# Patient Record
Sex: Female | Born: 1950 | Race: White | Hispanic: No | Smoking: Never smoker
Health system: Southern US, Community
[De-identification: ages and names within clinical notes are randomized; demographics above are authoritative.]

## PROBLEM LIST (undated history)

## (undated) DIAGNOSIS — R519 Headache, unspecified: Secondary | ICD-10-CM

## (undated) DIAGNOSIS — I499 Cardiac arrhythmia, unspecified: Secondary | ICD-10-CM

## (undated) DIAGNOSIS — F329 Major depressive disorder, single episode, unspecified: Secondary | ICD-10-CM

## (undated) DIAGNOSIS — J45909 Unspecified asthma, uncomplicated: Secondary | ICD-10-CM

## (undated) DIAGNOSIS — E119 Type 2 diabetes mellitus without complications: Secondary | ICD-10-CM

## (undated) DIAGNOSIS — M1990 Unspecified osteoarthritis, unspecified site: Secondary | ICD-10-CM

## (undated) DIAGNOSIS — F32A Depression, unspecified: Secondary | ICD-10-CM

## (undated) DIAGNOSIS — K3184 Gastroparesis: Secondary | ICD-10-CM

## (undated) DIAGNOSIS — G4730 Sleep apnea, unspecified: Secondary | ICD-10-CM

## (undated) DIAGNOSIS — M797 Fibromyalgia: Secondary | ICD-10-CM

## (undated) DIAGNOSIS — R51 Headache: Secondary | ICD-10-CM

## (undated) DIAGNOSIS — K219 Gastro-esophageal reflux disease without esophagitis: Secondary | ICD-10-CM

## (undated) DIAGNOSIS — I209 Angina pectoris, unspecified: Secondary | ICD-10-CM

## (undated) HISTORY — DX: Fibromyalgia: M79.7

## (undated) HISTORY — DX: Gastroparesis: K31.84

## (undated) HISTORY — PX: CHOLECYSTECTOMY: SHX55

## (undated) HISTORY — PX: BLADDER REPAIR: SHX76

## (undated) HISTORY — PX: ABDOMINAL HYSTERECTOMY: SHX81

## (undated) HISTORY — PX: TUBAL LIGATION: SHX77

## (undated) HISTORY — PX: OTHER SURGICAL HISTORY: SHX169

---

## 2011-01-08 DIAGNOSIS — G43909 Migraine, unspecified, not intractable, without status migrainosus: Secondary | ICD-10-CM | POA: Insufficient documentation

## 2011-05-22 DIAGNOSIS — M5030 Other cervical disc degeneration, unspecified cervical region: Secondary | ICD-10-CM | POA: Insufficient documentation

## 2013-02-08 HISTORY — PX: CATARACT EXTRACTION: SUR2

## 2015-10-02 DIAGNOSIS — Z8639 Personal history of other endocrine, nutritional and metabolic disease: Secondary | ICD-10-CM | POA: Insufficient documentation

## 2015-11-04 ENCOUNTER — Emergency Department: Payer: Medicare Other

## 2015-11-04 ENCOUNTER — Emergency Department
Admission: EM | Admit: 2015-11-04 | Discharge: 2015-11-04 | Disposition: A | Discharge status: Home / Self Care | Payer: Medicare Other | Attending: Emergency Medicine | Admitting: Emergency Medicine

## 2015-11-04 ENCOUNTER — Encounter: Payer: Self-pay | Admitting: Emergency Medicine

## 2015-11-04 DIAGNOSIS — Y939 Activity, unspecified: Secondary | ICD-10-CM | POA: Diagnosis not present

## 2015-11-04 DIAGNOSIS — W06XXXA Fall from bed, initial encounter: Secondary | ICD-10-CM | POA: Insufficient documentation

## 2015-11-04 DIAGNOSIS — S0990XA Unspecified injury of head, initial encounter: Secondary | ICD-10-CM

## 2015-11-04 DIAGNOSIS — E119 Type 2 diabetes mellitus without complications: Secondary | ICD-10-CM | POA: Diagnosis not present

## 2015-11-04 DIAGNOSIS — Y999 Unspecified external cause status: Secondary | ICD-10-CM | POA: Insufficient documentation

## 2015-11-04 DIAGNOSIS — S0181XA Laceration without foreign body of other part of head, initial encounter: Secondary | ICD-10-CM | POA: Diagnosis present

## 2015-11-04 DIAGNOSIS — F329 Major depressive disorder, single episode, unspecified: Secondary | ICD-10-CM | POA: Diagnosis not present

## 2015-11-04 DIAGNOSIS — Y929 Unspecified place or not applicable: Secondary | ICD-10-CM | POA: Insufficient documentation

## 2015-11-04 HISTORY — DX: Major depressive disorder, single episode, unspecified: F32.9

## 2015-11-04 HISTORY — DX: Type 2 diabetes mellitus without complications: E11.9

## 2015-11-04 HISTORY — DX: Depression, unspecified: F32.A

## 2015-11-04 NOTE — Discharge Instructions (Signed)
Facial Laceration ° A facial laceration is a cut on the face. These injuries can be painful and cause bleeding. Lacerations usually heal quickly, but they need special care to reduce scarring. °DIAGNOSIS  °Your health care provider will take a medical history, ask for details about how the injury occurred, and examine the wound to determine how deep the cut is. °TREATMENT  °Some facial lacerations may not require closure. Others may not be able to be closed because of an increased risk of infection. The risk of infection and the chance for successful closure will depend on various factors, including the amount of time since the injury occurred. °The wound may be cleaned to help prevent infection. If closure is appropriate, pain medicines may be given if needed. Your health care provider will use stitches (sutures), wound glue (adhesive), or skin adhesive strips to repair the laceration. These tools bring the skin edges together to allow for faster healing and a better cosmetic outcome. If needed, you may also be given a tetanus shot. °HOME CARE INSTRUCTIONS °· Only take over-the-counter or prescription medicines as directed by your health care provider. °· Follow your health care provider's instructions for wound care. These instructions will vary depending on the technique used for closing the wound. °For Sutures: °· Keep the wound clean and dry.   °· If you were given a bandage (dressing), you should change it at least once a day. Also change the dressing if it becomes wet or dirty, or as directed by your health care provider.   °· Wash the wound with soap and water 2 times a day. Rinse the wound off with water to remove all soap. Pat the wound dry with a clean towel.   °· After cleaning, apply a thin layer of the antibiotic ointment recommended by your health care provider. This will help prevent infection and keep the dressing from sticking.   °· You may shower as usual after the first 24 hours. Do not soak the  wound in water until the sutures are removed.   °· Get your sutures removed as directed by your health care provider. With facial lacerations, sutures should usually be taken out after 4-5 days to avoid stitch marks.   °· Wait a few days after your sutures are removed before applying any makeup. °For Skin Adhesive Strips: °· Keep the wound clean and dry.   °· Do not get the skin adhesive strips wet. You may bathe carefully, using caution to keep the wound dry.   °· If the wound gets wet, pat it dry with a clean towel.   °· Skin adhesive strips will fall off on their own. You may trim the strips as the wound heals. Do not remove skin adhesive strips that are still stuck to the wound. They will fall off in time.   °For Wound Adhesive: °· You may briefly wet your wound in the shower or bath. Do not soak or scrub the wound. Do not swim. Avoid periods of heavy sweating until the skin adhesive has fallen off on its own. After showering or bathing, gently pat the wound dry with a clean towel.   °· Do not apply liquid medicine, cream medicine, ointment medicine, or makeup to your wound while the skin adhesive is in place. This may loosen the film before your wound is healed.   °· If a dressing is placed over the wound, be careful not to apply tape directly over the skin adhesive. This may cause the adhesive to be pulled off before the wound is healed.   °· Avoid   prolonged exposure to sunlight or tanning lamps while the skin adhesive is in place. °· The skin adhesive will usually remain in place for 5-10 days, then naturally fall off the skin. Do not pick at the adhesive film.   °After Healing: °Once the wound has healed, cover the wound with sunscreen during the day for 1 full year. This can help minimize scarring. Exposure to ultraviolet light in the first year will darken the scar. It can take 1-2 years for the scar to lose its redness and to heal completely.  °SEEK MEDICAL CARE IF: °· You have a fever. °SEEK IMMEDIATE  MEDICAL CARE IF: °· You have redness, pain, or swelling around the wound.   °· You see a yellowish-white fluid (pus) coming from the wound.   °  °This information is not intended to replace advice given to you by your health care provider. Make sure you discuss any questions you have with your health care provider. °  °Document Released: 05/21/2004 Document Revised: 05/04/2014 Document Reviewed: 11/24/2012 °Elsevier Interactive Patient Education ©2016 [employer] ° °

## 2015-11-04 NOTE — ED Provider Notes (Signed)
[facility] Emergency Department Provider Note  ____________________________________________  Time seen: Approximately 12:48 PM  I have reviewed the triage vital signs and the nursing notes.   HISTORY  Chief Complaint Fall and Laceration   HPI Judith [name] is a 65 y.o. female who presents to the emergency department for evaluation of a laceration to her forehead. She states that her bed is high off the floor. The doorbell rang and she missed the step that she uses to get in and out of the bed and fell, hitting her head on the corner of the nightstand. Incident happened at about 8:30 this morning. Tetanus shot is up to date. She denies loss of consciousness.   Past Medical History  Diagnosis Date  . Diabetes mellitus without complication (HCC)   . Depression     There are no active problems to display for this patient.   History reviewed. No pertinent past surgical history.  No current outpatient prescriptions on file.  Allergies Pravastatin  History reviewed. No pertinent family history.  Social History Social History  Substance Use Topics  . Smoking status: Never Smoker   . Smokeless tobacco: None  . Alcohol Use: Yes    Review of Systems  Constitutional: Negative for fever/chills Respiratory: Negative for shortness of breath. Musculoskeletal: Negative for pain. Skin: Positive for laceration. Neurological: Negative for headaches, focal weakness or numbness. ____________________________________________   PHYSICAL EXAM:  VITAL SIGNS: ED Triage Vitals  Enc Vitals Group     BP 11/04/15 1218 121/73 mmHg     Pulse Rate 11/04/15 1218 84     Resp 11/04/15 1218 18     Temp 11/04/15 1218 98.5 F (36.9 C)     Temp Source 11/04/15 1218 Oral     SpO2 11/04/15 1218 95 %     Weight 11/04/15 1218 209 lb (94.802 kg)     Height 11/04/15 1218 5\' 1"  (1.549 m)     Head Cir --      Peak Flow --      Pain Score 11/04/15 1226 8     Pain Loc --       Pain Edu? --      Excl. in GC? --      Constitutional: Alert and oriented. Well appearing and in no acute distress. Head: Area under small laceration appears slightly depressed. Eyes: Conjunctivae are normal. EOMI. Nose: No congestion/rhinnorhea. Mouth/Throat: Mucous membranes are moist.   Neck: No stridor. Lymphatic: No cervical lymphadenopathy. Cardiovascular: Good peripheral circulation. Respiratory: Normal respiratory effort.  No retractions. Musculoskeletal: FROM throughout. Nexus criteria negative.  Neurologic:  Normal speech and language. No gross focal neurologic deficits are appreciated. Skin:  1cm laceration to the left forehead. Contusion surrounding laceration noted.   ____________________________________________   LABS (all labs ordered are listed, but only abnormal results are displayed)  Labs Reviewed - No data to display ____________________________________________  EKG   ____________________________________________  RADIOLOGY  CT head without contrast is negative for acute abnormality. ____________________________________________   PROCEDURES  Procedure(s) performed:   LACERATION REPAIR Performed by: Kem Boroughsari Antwann Preziosi  Consent: Verbal consent obtained.  Consent given by: patient  Laceration Location: left forehead  Laceration Length: 1cm  Anesthesia: n/a  Irrigation method: syringe  Amount of cleaning: standard  Skin closure: skin adhesive  Number of sutures: n/a  Technique: topical after re-approximating wound.  Patient tolerance: Patient tolerated the procedure well with no immediate complications.  ____________________________________________   INITIAL IMPRESSION / ASSESSMENT AND PLAN / ED COURSE  Pertinent  labs & imaging results that were available during my care of the patient were reviewed by me and considered in my medical decision making (see chart for details).  Head injury instructions discussed with the patient.  She is to return to the ER for any symptoms of concern if unable to schedule an appointment with her PCP.   There are no discharge medications for this patient.   ____________________________________________   INITIAL IMPRESSION / ASSESSMENT AND PLAN / ED COURSE  Pertinent labs & imaging results that were available during my care of the patient were reviewed by me and considered in my medical decision making (see chart for details).  She will be advised to take tylenol or ibuprofen if needed for pain.  She was advised to follow up with her PCP for concerns.  She was also advised to return to the emergency department for symptoms that change or worsen if unable to schedule an appointment.  ____________________________________________   FINAL CLINICAL IMPRESSION(S) / ED DIAGNOSES  Final diagnoses:  Facial laceration, initial encounter  Minor head injury, initial encounter    There are no discharge medications for this patient.    Chinita Pester, FNP 11/04/15 1447  Richardean Canal, MD 11/04/15 1538

## 2015-11-04 NOTE — ED Notes (Signed)
Pt presents to ED c/o laceration to right upper forehead due to a fall. Pt hit bed nightstand. Bleeding controlled.

## 2016-01-07 ENCOUNTER — Other Ambulatory Visit: Payer: Self-pay | Admitting: Internal Medicine

## 2016-01-07 DIAGNOSIS — R7401 Elevation of levels of liver transaminase levels: Secondary | ICD-10-CM

## 2016-01-07 DIAGNOSIS — R748 Abnormal levels of other serum enzymes: Secondary | ICD-10-CM

## 2016-01-07 DIAGNOSIS — R109 Unspecified abdominal pain: Secondary | ICD-10-CM

## 2016-01-07 DIAGNOSIS — R740 Nonspecific elevation of levels of transaminase and lactic acid dehydrogenase [LDH]: Secondary | ICD-10-CM

## 2016-01-13 ENCOUNTER — Ambulatory Visit
Admission: RE | Admit: 2016-01-13 | Discharge: 2016-01-13 | Disposition: A | Discharge status: Home / Self Care | Payer: Medicare Other | Source: Ambulatory Visit | Attending: Internal Medicine | Admitting: Internal Medicine

## 2016-01-13 ENCOUNTER — Encounter: Payer: Self-pay | Admitting: Radiology

## 2016-01-13 DIAGNOSIS — R195 Other fecal abnormalities: Secondary | ICD-10-CM | POA: Insufficient documentation

## 2016-01-13 DIAGNOSIS — R160 Hepatomegaly, not elsewhere classified: Secondary | ICD-10-CM | POA: Insufficient documentation

## 2016-01-13 DIAGNOSIS — K760 Fatty (change of) liver, not elsewhere classified: Secondary | ICD-10-CM | POA: Insufficient documentation

## 2016-01-13 DIAGNOSIS — R109 Unspecified abdominal pain: Secondary | ICD-10-CM

## 2016-01-13 DIAGNOSIS — R740 Nonspecific elevation of levels of transaminase and lactic acid dehydrogenase [LDH]: Secondary | ICD-10-CM

## 2016-01-13 DIAGNOSIS — M5135 Other intervertebral disc degeneration, thoracolumbar region: Secondary | ICD-10-CM | POA: Insufficient documentation

## 2016-01-13 DIAGNOSIS — R7401 Elevation of levels of liver transaminase levels: Secondary | ICD-10-CM

## 2016-01-13 DIAGNOSIS — R1010 Upper abdominal pain, unspecified: Secondary | ICD-10-CM | POA: Diagnosis present

## 2016-01-13 DIAGNOSIS — R748 Abnormal levels of other serum enzymes: Secondary | ICD-10-CM | POA: Diagnosis present

## 2016-01-13 MED ORDER — IOPAMIDOL (ISOVUE-370) INJECTION 76%
100.0000 mL | Freq: Once | INTRAVENOUS | Status: AC | PRN
  Administered 2016-01-13: 100 mL via INTRAVENOUS

## 2016-03-31 ENCOUNTER — Encounter: Payer: Self-pay | Admitting: *Deleted

## 2016-04-01 ENCOUNTER — Ambulatory Visit: Payer: Medicare Other | Admitting: Anesthesiology

## 2016-04-01 ENCOUNTER — Encounter
Admission: RE | Disposition: A | Discharge status: Home / Self Care | Payer: Self-pay | Source: Ambulatory Visit | Attending: Unknown Physician Specialty

## 2016-04-01 ENCOUNTER — Ambulatory Visit
Admission: RE | Admit: 2016-04-01 | Discharge: 2016-04-01 | Disposition: A | Discharge status: Home / Self Care | Payer: Medicare Other | Source: Ambulatory Visit | Attending: Unknown Physician Specialty | Admitting: Unknown Physician Specialty

## 2016-04-01 ENCOUNTER — Encounter: Payer: Self-pay | Admitting: Anesthesiology

## 2016-04-01 DIAGNOSIS — J45909 Unspecified asthma, uncomplicated: Secondary | ICD-10-CM | POA: Insufficient documentation

## 2016-04-01 DIAGNOSIS — G4730 Sleep apnea, unspecified: Secondary | ICD-10-CM | POA: Insufficient documentation

## 2016-04-01 DIAGNOSIS — Z79899 Other long term (current) drug therapy: Secondary | ICD-10-CM | POA: Insufficient documentation

## 2016-04-01 DIAGNOSIS — K2950 Unspecified chronic gastritis without bleeding: Secondary | ICD-10-CM | POA: Diagnosis not present

## 2016-04-01 DIAGNOSIS — Z7951 Long term (current) use of inhaled steroids: Secondary | ICD-10-CM | POA: Insufficient documentation

## 2016-04-01 DIAGNOSIS — Z7984 Long term (current) use of oral hypoglycemic drugs: Secondary | ICD-10-CM | POA: Insufficient documentation

## 2016-04-01 DIAGNOSIS — R1310 Dysphagia, unspecified: Secondary | ICD-10-CM | POA: Diagnosis present

## 2016-04-01 DIAGNOSIS — E119 Type 2 diabetes mellitus without complications: Secondary | ICD-10-CM | POA: Diagnosis not present

## 2016-04-01 HISTORY — PX: ESOPHAGOGASTRODUODENOSCOPY (EGD) WITH PROPOFOL: SHX5813

## 2016-04-01 HISTORY — DX: Sleep apnea, unspecified: G47.30

## 2016-04-01 HISTORY — DX: Gastro-esophageal reflux disease without esophagitis: K21.9

## 2016-04-01 HISTORY — DX: Headache, unspecified: R51.9

## 2016-04-01 HISTORY — DX: Angina pectoris, unspecified: I20.9

## 2016-04-01 HISTORY — DX: Unspecified osteoarthritis, unspecified site: M19.90

## 2016-04-01 HISTORY — DX: Unspecified asthma, uncomplicated: J45.909

## 2016-04-01 HISTORY — DX: Headache: R51

## 2016-04-01 LAB — GLUCOSE, CAPILLARY: Glucose-Capillary: 152 mg/dL — ABNORMAL HIGH (ref 65–99)

## 2016-04-01 SURGERY — ESOPHAGOGASTRODUODENOSCOPY (EGD) WITH PROPOFOL
Anesthesia: General

## 2016-04-01 MED ORDER — LACTATED RINGERS IV SOLN
INTRAVENOUS | Status: DC | PRN
  Administered 2016-04-01: 08:00:00 via INTRAVENOUS

## 2016-04-01 MED ORDER — PROPOFOL 500 MG/50ML IV EMUL
INTRAVENOUS | Status: DC | PRN
  Administered 2016-04-01: 125 ug/kg/min via INTRAVENOUS

## 2016-04-01 MED ORDER — SODIUM CHLORIDE 0.9 % IV SOLN
INTRAVENOUS | Status: DC
  Administered 2016-04-01: 1000 mL via INTRAVENOUS

## 2016-04-01 MED ORDER — SODIUM CHLORIDE 0.9 % IV SOLN: INTRAVENOUS | Status: DC

## 2016-04-01 MED ORDER — PROPOFOL 10 MG/ML IV BOLUS
INTRAVENOUS | Status: DC | PRN
  Administered 2016-04-01: 50 mg via INTRAVENOUS

## 2016-04-01 NOTE — Anesthesia Preprocedure Evaluation (Signed)
Anesthesia Evaluation  Patient identified by MRN, date of birth, ID band Patient awake    Reviewed: Allergy & Precautions, H&P , NPO status , Patient's Chart, lab work & pertinent test results, reviewed documented beta blocker date and time   History of Anesthesia Complications Negative for: history of anesthetic complications  Airway Mallampati: II  TM Distance: >3 FB Neck ROM: full    Dental no notable dental hx. (+) Teeth Intact   Pulmonary neg shortness of breath, asthma , sleep apnea and Continuous Positive Airway Pressure Ventilation , neg COPD, Recent URI , Residual Cough,    Pulmonary exam normal breath sounds clear to auscultation       Cardiovascular Exercise Tolerance: Good negative cardio ROS Normal cardiovascular exam Rhythm:regular Rate:Normal     Neuro/Psych PSYCHIATRIC DISORDERS (Depression) negative neurological ROS     GI/Hepatic Neg liver ROS, GERD  ,  Endo/Other  diabetes, Well Controlled, Type 2, Oral Hypoglycemic Agents  Renal/GU negative Renal ROS  negative genitourinary   Musculoskeletal   Abdominal   Peds  Hematology negative hematology ROS (+)   Anesthesia Other Findings Past Medical History: No date: Anginal pain (HCC) No date: Arthritis No date: Asthma No date: Depression No date: Diabetes mellitus without complication (HCC) No date: GERD (gastroesophageal reflux disease) No date: Headache No date: Sleep apnea   Reproductive/Obstetrics negative OB ROS                             Anesthesia Physical Anesthesia Plan  ASA: II  Anesthesia Plan: General   Post-op Pain Management:    Induction:   Airway Management Planned:   Additional Equipment:   Intra-op Plan:   Post-operative Plan:   Informed Consent: I have reviewed the patients History and Physical, chart, labs and discussed the procedure including the risks, benefits and alternatives for  the proposed anesthesia with the patient or authorized representative who has indicated his/her understanding and acceptance.   Dental Advisory Given  Plan Discussed with: Anesthesiologist, CRNA and Surgeon  Anesthesia Plan Comments:         Anesthesia Quick Evaluation

## 2016-04-01 NOTE — Transfer of Care (Signed)
Immediate Anesthesia Transfer of Care Note  Patient: Judith [name]  Procedure(s) Performed: Procedure(s): ESOPHAGOGASTRODUODENOSCOPY (EGD) WITH PROPOFOL (N/A)  Patient Location: PACU and Endoscopy Unit  Anesthesia Type:General  Level of Consciousness: awake, alert  and oriented  Airway & Oxygen Therapy: Patient Spontanous Breathing and Patient connected to nasal cannula oxygen  Post-op Assessment: Report given to RN and Post -op Vital signs reviewed and stable  Post vital signs: Reviewed and stable  Last Vitals:  Vitals:   04/01/16 0643  BP: (!) 145/86  Pulse: 100  Resp: 16  Temp: 36.5 C    Last Pain:  Vitals:   04/01/16 0643  TempSrc: Tympanic         Complications: No apparent anesthesia complications

## 2016-04-01 NOTE — H&P (Signed)
Primary Care Physician:  Leotis ShamesSingh,Jasmine, MD Primary Gastroenterologist:  Dr. Mechele CollinElliott  Pre-Procedure History & Physical: HPI:  Judith Saferatricia Delcastillo is a 65 y.o. female is here for an endoscopy.   Past Medical History:  Diagnosis Date  . Anginal pain (HCC)   . Arthritis   . Asthma   . Depression   . Diabetes mellitus without complication (HCC)   . GERD (gastroesophageal reflux disease)   . Headache   . Sleep apnea     Past Surgical History:  Procedure Laterality Date  . ABDOMINAL HYSTERECTOMY    . BLADDER REPAIR    . CATARACT EXTRACTION Left 02/08/2013  . CHOLECYSTECTOMY      Prior to Admission medications   Medication Sig Start Date End Date Taking? Authorizing Provider  albuterol (PROVENTIL HFA;VENTOLIN HFA) 108 (90 Base) MCG/ACT inhaler Inhale 2 puffs into the lungs every 6 (six) hours as needed for wheezing or shortness of breath.   Yes Historical Provider, MD  cetirizine (ZYRTEC) 10 MG tablet Take 10 mg by mouth daily.   Yes Historical Provider, MD  glipiZIDE (GLUCOTROL XL) 10 MG 24 hr tablet Take 10 mg by mouth daily with breakfast.   Yes Historical Provider, MD  glucose blood test strip 1 each by Other route. Use as instructed   Yes Historical Provider, MD  linagliptin (TRADJENTA) 5 MG TABS tablet Take 5 mg by mouth daily.   Yes Historical Provider, MD  metFORMIN (GLUCOPHAGE) 500 MG tablet Take 1,000 mg by mouth 2 (two) times daily with a meal.   Yes Historical Provider, MD  mometasone (NASONEX) 50 MCG/ACT nasal spray Place 2 sprays into the nose daily.   Yes Historical Provider, MD  Multiple Vitamin (MULTIVITAMIN) tablet Take 1 tablet by mouth daily.   Yes Historical Provider, MD    Allergies as of 03/16/2016 - Review Complete 01/13/2016  Allergen Reaction Noted  . Pravastatin Other (See Comments) 11/04/2015    History reviewed. No pertinent family history.  Social History   Social History  . Marital status: Widowed    Spouse name: N/A  . Number of children: N/A   . Years of education: N/A   Occupational History  . Not on file.   Social History Main Topics  . Smoking status: Never Smoker  . Smokeless tobacco: Never Used  . Alcohol use Yes     Comment: averages lessthan 1 drink per week  . Drug use: No  . Sexual activity: No   Other Topics Concern  . Not on file   Social History Narrative  . No narrative on file    Review of Systems: See HPI, otherwise negative ROS  Physical Exam: BP (!) 145/86   Pulse 100   Temp 97.7 F (36.5 C) (Tympanic)   Resp 16   Ht 5\' 2"  (1.575 m)   Wt 90.7 kg (200 lb)   SpO2 97%   BMI 36.58 kg/m  General:   Alert,  pleasant and cooperative in NAD Head:  Normocephalic and atraumatic. Neck:  Supple; no masses or thyromegaly. Lungs:  Clear throughout to auscultation.    Heart:  Regular rate and rhythm. Abdomen:  Soft, nontender and nondistended. Normal bowel sounds, without guarding, and without rebound, somewhat obese.Marland Kitchen.   Neurologic:  Alert and  oriented x4;  grossly normal neurologically.  Impression/Plan: Judith [name] is here for an endoscopy to be performed for epigastric pain, dysphagia  Risks, benefits, limitations, and alternatives regarding  endoscopy have been reviewed with the patient.  Questions have been  answered.  All parties agreeable.   Lynnae PrudeELLIOTT, Ollis Daudelin, MD  04/01/2016, 7:24 AM

## 2016-04-01 NOTE — Anesthesia Postprocedure Evaluation (Signed)
Anesthesia Post Note  Patient: Rivka Saferatricia Kiely  Procedure(s) Performed: Procedure(s) (LRB): ESOPHAGOGASTRODUODENOSCOPY (EGD) WITH PROPOFOL (N/A)  Patient location during evaluation: Endoscopy Anesthesia Type: General Level of consciousness: awake and alert Pain management: pain level controlled Vital Signs Assessment: post-procedure vital signs reviewed and stable Respiratory status: spontaneous breathing, nonlabored ventilation, respiratory function stable and patient connected to nasal cannula oxygen Cardiovascular status: blood pressure returned to baseline and stable Postop Assessment: no signs of nausea or vomiting Anesthetic complications: no    Last Vitals:  Vitals:   04/01/16 0810 04/01/16 0820  BP: 106/67 (!) 106/57  Pulse: 85 83  Resp: 16 14  Temp:      Last Pain:  Vitals:   04/01/16 0750  TempSrc: Tympanic                 Lenard SimmerAndrew Angelik Walls

## 2016-04-01 NOTE — Op Note (Signed)
[facility] Gastroenterology Patient Name: Judith [name] Procedure Date: 04/01/2016 7:30 AM MRN: [id number] Account #: [id number] Date of Birth: 04/07/1951 Admit Type: Outpatient Age: [age] Room[facility] ENDO ROOM 4 Gender: Female Note Status: Finalized Procedure:            Upper GI endoscopy Indications:          Dysphagia Providers:            Scot Junobert T. Elliott, MD Referring MD:         Leotis ShamesJasmine Singh (Referring MD) Medicines:            Propofol per Anesthesia Complications:        No immediate complications. Procedure:            Pre-Anesthesia Assessment:                       - After reviewing the risks and benefits, the patient                        was deemed in satisfactory condition to undergo the                        procedure.                       After obtaining informed consent, the endoscope was                        passed under direct vision. Throughout the procedure,                        the patient's blood pressure, pulse, and oxygen                        saturations were monitored continuously. The Endoscope                        was introduced through the mouth, and advanced to the                        second part of duodenum. The upper GI endoscopy was                        accomplished without difficulty. The patient tolerated                        the procedure well. Findings:      The examined esophagus was normal.At the end of the procedure A       guidewire was placed and the scope was withdrawn. Dilation was performed       with a Savary dilator with mild resistance at 15 mm, 16 mm and 17 mm.      A single small sessile polyp with no bleeding and no stigmata of recent       bleeding was found in the gastric fundus. Biopsies were taken with a       cold forceps for histology. Stomach otherwise normal.      The examined duodenum was normal. Impression:           - Normal esophagus. Dilated.                       -  A single  gastric polyp. Biopsied.                       - Normal examined duodenum. Recommendation:       - soft food for 3 days, eat slowly, chew well, take                        small bites Scot Junobert T Elliott, MD 04/01/2016 7:46:47 AM This report has been signed electronically. Number of Addenda: 0 Note Initiated On: 04/01/2016 7:30 AM      [facility]

## 2016-04-02 ENCOUNTER — Encounter: Payer: Self-pay | Admitting: Unknown Physician Specialty

## 2016-04-02 LAB — SURGICAL PATHOLOGY

## 2016-08-23 DIAGNOSIS — R2689 Other abnormalities of gait and mobility: Secondary | ICD-10-CM | POA: Insufficient documentation

## 2016-10-13 DIAGNOSIS — R413 Other amnesia: Secondary | ICD-10-CM | POA: Insufficient documentation

## 2016-10-13 DIAGNOSIS — R251 Tremor, unspecified: Secondary | ICD-10-CM | POA: Insufficient documentation

## 2016-10-13 DIAGNOSIS — R519 Headache, unspecified: Secondary | ICD-10-CM | POA: Insufficient documentation

## 2017-01-25 ENCOUNTER — Other Ambulatory Visit: Payer: Self-pay | Admitting: Internal Medicine

## 2017-01-25 DIAGNOSIS — Z1231 Encounter for screening mammogram for malignant neoplasm of breast: Secondary | ICD-10-CM

## 2017-02-11 ENCOUNTER — Ambulatory Visit
Admission: RE | Admit: 2017-02-11 | Discharge: 2017-02-11 | Disposition: A | Discharge status: Home / Self Care | Payer: Medicare Other | Source: Ambulatory Visit | Attending: Internal Medicine | Admitting: Internal Medicine

## 2017-02-11 DIAGNOSIS — Z1231 Encounter for screening mammogram for malignant neoplasm of breast: Secondary | ICD-10-CM | POA: Diagnosis not present

## 2017-03-17 ENCOUNTER — Ambulatory Visit: Payer: Medicare Other | Admitting: Physical Therapy

## 2017-03-23 ENCOUNTER — Ambulatory Visit: Payer: Medicare Other | Attending: Neurology | Admitting: Physical Therapy

## 2017-03-23 ENCOUNTER — Encounter: Payer: Self-pay | Admitting: Physical Therapy

## 2017-03-23 DIAGNOSIS — M6281 Muscle weakness (generalized): Secondary | ICD-10-CM | POA: Insufficient documentation

## 2017-03-23 DIAGNOSIS — G5603 Carpal tunnel syndrome, bilateral upper limbs: Secondary | ICD-10-CM | POA: Insufficient documentation

## 2017-03-23 DIAGNOSIS — G478 Other sleep disorders: Secondary | ICD-10-CM | POA: Diagnosis present

## 2017-03-23 NOTE — Therapy (Signed)
[facility] MAIN [facility] SERVICES [address] [place], [place], [address] Phone: [phone]   Fax:  [phone]  Physical Therapy Evaluation  Patient Details  Name: Judith [name] MRN: [id number] Date of Birth: 01-28-1951 Referring Provider: Dr. Malvin Johns   Encounter Date: 03/23/2017  PT End of Session - 03/23/17 1621    Visit Number  1    Number of Visits  17    Date for PT Re-Evaluation  05/18/17    PT Start Time  0400    PT Stop Time  0500    PT Time Calculation (min)  60 min    Equipment Utilized During Treatment  Gait belt    Activity Tolerance  Patient tolerated treatment well;Patient limited by fatigue    Behavior During Therapy  [facility] for tasks assessed/performed       Past Medical History:  Diagnosis Date  . Anginal pain (HCC)   . Arthritis   . Asthma   . Depression   . Diabetes mellitus without complication (HCC)   . GERD (gastroesophageal reflux disease)   . Headache   . Sleep apnea     Past Surgical History:  Procedure Laterality Date  . ABDOMINAL HYSTERECTOMY    . BLADDER REPAIR    . CATARACT EXTRACTION Left 02/08/2013  . CHOLECYSTECTOMY    . ESOPHAGOGASTRODUODENOSCOPY (EGD) WITH PROPOFOL N/A 04/01/2016   Procedure: ESOPHAGOGASTRODUODENOSCOPY (EGD) WITH PROPOFOL;  Surgeon: Scot Jun, MD;  Location: [facility] ENDOSCOPY;  Service: Endoscopy;  Laterality: N/A;    There were no vitals filed for this visit.   Subjective Assessment - 03/23/17 1611    Subjective  Patient is having gait imbalance and sometimes uses the walls if she has lost her balance.     Pertinent History  atient is 66 year old that has had 2 falls without  knowing the cause , she passed out. The second time was november 7th and she was going up the step and had her hands full. She does not use a AD. she hit her left knee.  She had a n occipital block 1 week ago and now her headaches are goine. She has fibromyalgia and has mild pain in her shoulders, and  back . She has DDD in lumbar spine.     How long can you walk comfortably?  She is able to walk the dog and walk for and is unlimited with her time.     Patient Stated Goals  She wants to have better balance.     Multiple Pain Sites  No         [facility] PT Assessment - 03/23/17 0001      Assessment   Medical Diagnosis  gait imbalance    Referring Provider  Dr. Malvin Johns    Onset Date/Surgical Date  02/26/17    Hand Dominance  Right    Prior Therapy  no      Precautions   Precautions  Fall      Restrictions   Weight Bearing Restrictions  No    Other Position/Activity Restrictions  no      Balance Screen   Has the patient fallen in the past 6 months  Yes    How many times?  2    Has the patient had a decrease in activity level because of a fear of falling?   No    Is the patient reluctant to leave their home because of a fear of falling?   No  Home Environment   Living Environment  Private residence    Living Arrangements  Other relatives    Available Help at Discharge  Family    Type of Home  House    Home Access  Stairs to enter    Entrance Stairs-Number of Steps  1    Entrance Stairs-Rails  None    Home Layout  One level    Home Equipment  None      Prior Function   Level of Independence  Independent with basic ADLs;Independent with transfers;Independent with gait    Vocation  Retired         POSTURE: WNL   PROM/AROM: WNL  STRENGTH:  Graded on a 0-5 scale Muscle Group Left Right                          Hip Flex 5/5 5/5  Hip Abd 5/5 5/5  Hip Add -3/5 -3/5  Hip Ext 5/5 5/5  Hip IR/ER 5/5 5/5  Knee Flex 5/5 5/5  Knee Ext 5/5 5/5  Ankle DF 4/5 4/5  Ankle PF 3+/5 3+/5   SENSATION: WNL BUE    FUNCTIONAL MOBILITY: independent with transfers and bed mobility   BALANCE: Standing Dynamic Balance  Normal Stand independently unsupported, able to weight shift and cross midline maximally   Good Stand independently unsupported, able to weight shift  and cross midline moderately x  Good-/Fair+ Stand independently unsupported, able to weight shift across midline minimally   Fair Stand independently unsupported, weight shift, and reach ipsilaterally, loss of balance when crossing midline   Poor+ Able to stand with Min A and reach ipsilaterally, unable to weight shift   Poor Able to stand with Mod A and minimally reach ipsilaterally, unable to cross midline.    Static Standing Balance  Normal Able to maintain standing balance against maximal resistance   Good Able to maintain standing balance against moderate resistance x  Good-/Fair+ Able to maintain standing balance against minimal resistance   Fair Able to stand unsupported without UE support and without LOB for 1-2 min   Fair- Requires Min A and UE support to maintain standing without loss of balance   Poor+ Requires mod A and UE support to maintain standing without loss of balance   Poor Requires max A and UE support to maintain standing balance without loss       GAIT: Ambulates without AD and has difficulty with head turns and fast stops and pivot turns  OUTCOME MEASURES: TEST Outcome Interpretation  5 times sit<>stand 15.72sec >7 yo, >15 sec indicates increased risk for falls  10 meter walk test     1.31            m/[name] <1.0 m/[name] indicates increased risk for falls; limited community ambulator  Timed up and Go      8.44            sec <14 sec indicates increased risk for falls      Berg Balance Assessment 52/56 <36/56 (100% risk for falls), 37-45 (80% risk for falls); 46-51 (>50% risk for falls); 52-55 (lower risk <25% of falls)         Treatment:  heel raises x 10 x 2 RTB ankle eversion and ankle DF x 10 BLE     Objective measurements completed on examination: See above findings.              PT Education - 03/23/17 1620  Education provided  Yes    Education Details  plan of care    Person([name]) Educated  Patient    Methods  Explanation    Comprehension   Verbalized understanding;Returned demonstration       PT Short Term Goals - 03/23/17 1715      PT SHORT TERM GOAL #1   Title  Patient will be independent in home exercise program to improve strength/mobility for better functional independence with ADLs.    Time  4    Period  Weeks    Status  New    Target Date  04/20/17      PT SHORT TERM GOAL #2   Title  Patient (> 66 years old) will complete five times sit to stand test in < 15 seconds indicating an increased LE strength and improved balance.    Time  4    Period  Weeks    Status  New    Target Date  04/20/17        PT Long Term Goals - 03/23/17 1717      PT LONG TERM GOAL #1   Title  Patient will increase Berg Balance score by > 6 points to demonstrate decreased fall risk during functional activities.    Time  8    Period  Weeks    Status  New    Target Date  05/18/17      PT LONG TERM GOAL #2   Title  Patient will be independent with ascend/descend 12 steps using single UE in step over step pattern without LOB.    Time  8    Period  Weeks    Status  New    Target Date  05/18/17      PT LONG TERM GOAL #3   Title  Patient will be require no assist with ascend/descend 3 steps using Least restrictive assistive device.    Time  8    Period  Weeks    Status  New    Target Date  05/18/17             Plan - 03/23/17 1721    Clinical Impression Statement  Patient has had 2 falls and presents to PT for eval for gait instabiity. She has decreased ankle strength and outcome measures indicate a  balance defcit with Berg balance 52/56. She will benefit from skilled PT to improve balance and decrease her falls risk.     Clinical Presentation  Stable    Clinical Decision Making  Low    Rehab Potential  Good    PT Frequency  2x / week    PT Duration  8 weeks    PT Treatment/Interventions  Therapeutic exercise;Therapeutic activities;Neuromuscular re-education;Balance training;Stair training;Functional mobility training     PT Next Visit Plan  balance training and ankle strengthening    PT Home Exercise Plan  heel raises, ankle eversion and resisted DF with RTB    Consulted and Agree with Plan of Care  Patient       Patient will benefit from skilled therapeutic intervention in order to improve the following deficits and impairments:  Decreased balance, Difficulty walking, Decreased strength  Visit Diagnosis: Muscle weakness (generalized)  Difficulty waking  G-Codes - 03/23/17 1720    Functional Assessment Tool Used (Outpatient Only)  5 x sit to stand, berg    Functional Limitation  Mobility: Walking and moving around    Mobility: Walking and Moving Around Current Status ([id number])  At least 20  percent but less than 40 percent impaired, limited or restricted    Mobility: Walking and Moving Around Goal Status [phone]([id number])  At least 1 percent but less than 20 percent impaired, limited or restricted        Problem List There are no active problems to display for this patient.   [address]Judith [name], [place] DPT 03/23/2017, 5:34 PM  [place] [facility] [address] [name], [address] Phone: [phone]   Fax:  [phone]  Name: Judith [name] MRN: [id number] Date of Birth: 02/14/1951

## 2017-03-25 ENCOUNTER — Ambulatory Visit: Payer: Medicare Other | Admitting: Physical Therapy

## 2017-03-30 ENCOUNTER — Encounter: Payer: Self-pay | Admitting: Physical Therapy

## 2017-03-30 ENCOUNTER — Ambulatory Visit: Payer: Medicare Other | Attending: Neurology | Admitting: Physical Therapy

## 2017-03-30 DIAGNOSIS — M6281 Muscle weakness (generalized): Secondary | ICD-10-CM | POA: Diagnosis not present

## 2017-03-30 DIAGNOSIS — G478 Other sleep disorders: Secondary | ICD-10-CM | POA: Diagnosis present

## 2017-03-30 NOTE — Therapy (Signed)
[facility] [address] [place], [place], [address] Phone: [phone]   Fax:  [phone]  Physical Therapy Treatment  Patient Details  Name: Judith [name] MRN: [id number] Date of Birth: 07/30/1950 Referring Provider: Dr. Malvin JohnsPotter   Encounter Date: 03/30/2017  PT End of Session - 03/30/17 1606    Visit Number  2    Number of Visits  17    Date for PT Re-Evaluation  05/18/17    PT Start Time  1515    PT Stop Time  1600    PT Time Calculation (min)  45 min    Equipment Utilized During Treatment  Gait belt    Activity Tolerance  Patient tolerated treatment well;Patient limited by fatigue    Behavior During Therapy  [facility] for tasks assessed/performed       Past Medical History:  Diagnosis Date  . Anginal pain (HCC)   . Arthritis   . Asthma   . Depression   . Diabetes mellitus without complication (HCC)   . GERD (gastroesophageal reflux disease)   . Headache   . Sleep apnea     Past Surgical History:  Procedure Laterality Date  . ABDOMINAL HYSTERECTOMY    . BLADDER REPAIR    . CATARACT EXTRACTION Left 02/08/2013  . CHOLECYSTECTOMY    . ESOPHAGOGASTRODUODENOSCOPY (EGD) WITH PROPOFOL N/A 04/01/2016   Procedure: ESOPHAGOGASTRODUODENOSCOPY (EGD) WITH PROPOFOL;  Surgeon: Scot Junobert T Elliott, MD;  [facility];  Service: Endoscopy;  Laterality: N/A;    There were no vitals filed for this visit.  Subjective Assessment - 03/30/17 1604    Subjective  Pt reports no falls since her last visit.  She states she is having trouble remembering the correct way to do all of her 3 new home exercises.    Pertinent History  atient is 66 year old that has had 2 falls without  knowing the cause , she passed out. The second time was november 7th and she was going up the step and had her hands full. She does not use a AD. she hit her left knee.  She had a n occipital block 1 week ago and now her headaches are goine. She has  fibromyalgia and has mild pain in her shoulders, and back . She has DDD in lumbar spine.     How long can you walk comfortably?  She is able to walk the dog and walk for and is unlimited with her time.     Patient Stated Goals  She wants to have better balance.     Currently in Pain?  No/denies    Multiple Pain Sites  No        Treatment:  Seated ankle DF and eversion with RTB 2x10 each B; standing heel raises with hands on rails 2x10; sit to stands from gray plinth with RTB around thighs and hips abducted 2x10.  Discussed correct technique for HEP with ankle strengthening ex's-pt given hand outs.  SLS on AirEx pad with EO/EC; tandem stance on 2x2 board; tandem gait on line in // bars without UE assist; mini lunges onto AirEx pad alternating LE's without UE assist; balancing on half foam roller; further balancing on half foam roller with ball switch back and forth from hands                      PT Education - 03/30/17 1604    Education provided  Yes    Education Details  Reviewed HEP and corrected technique with pt:  heel raises, ankle eversion with RTB, and ankle DF with RTB- all 2x10-- gave pt hand outs from [employer]    Person(s) Educated  Patient    Methods  Explanation;Tactile cues;Verbal cues;Handout    Comprehension  Verbalized understanding;Returned demonstration       PT Short Term Goals - 03/30/17 1608      PT SHORT TERM GOAL #1   Title  Patient will be independent in home exercise program to improve strength/mobility for better functional independence with ADLs.    Time  4    Period  Weeks    Status  New      PT SHORT TERM GOAL #2   Title  Patient (> 66 years old) will complete five times sit to stand test in < 15 seconds indicating an increased LE strength and improved balance.    Time  4    Period  Weeks    Status  New        PT Long Term Goals - 03/30/17 1608      PT LONG TERM GOAL #1   Title  Patient will increase Berg Balance score by > 6  points to demonstrate decreased fall risk during functional activities.    Time  8    Period  Weeks    Status  New      PT LONG TERM GOAL #2   Title  Patient will be independent with ascend/descend 12 steps using single UE in step over step pattern without LOB.    Time  8    Period  Weeks    Status  New      PT LONG TERM GOAL #3   Title  Patient will be require no assist with ascend/descend 3 steps using Least restrictive assistive device.    Time  8    Period  Weeks    Status  New            Plan - 03/30/17 1606    Clinical Impression Statement  Pt tolerated NMR activities without problems today.  She found SLS on AirEx to be particularly challenging today.  Pt was advised on ways to improve balance in a safe manner at kitchen sink (SLS, tandem stance etc)    Clinical Presentation  Stable    Clinical Decision Making  Low    Rehab Potential  Good    PT Frequency  2x / week    PT Duration  8 weeks    PT Treatment/Interventions  Therapeutic exercise;Therapeutic activities;Neuromuscular re-education;Balance training;Stair training;Functional mobility training    PT Next Visit Plan  balance training and ankle strengthening    PT Home Exercise Plan  heel raises, ankle eversion and resisted DF with RTB; kitchen sink SLS and tandem stance    Consulted and Agree with Plan of Care  Patient       Patient will benefit from skilled therapeutic intervention in order to improve the following deficits and impairments:  Decreased balance, Difficulty walking, Decreased strength  Visit Diagnosis: Muscle weakness (generalized)  Difficulty waking     Problem List There are no active problems to display for this patient.   Marcy Sookdeo, MPT 03/30/2017, 4:09 PM  [place] [facility] [address] [place], [place], [address] Phone: [phone]   Fax:  [phone]  Name: Judith [name] MRN: [id number] Date of Birth:  03/26/1951

## 2017-04-01 ENCOUNTER — Ambulatory Visit: Payer: Medicare Other | Admitting: Physical Therapy

## 2017-04-01 ENCOUNTER — Encounter: Payer: Self-pay | Admitting: Physical Therapy

## 2017-04-01 DIAGNOSIS — M6281 Muscle weakness (generalized): Secondary | ICD-10-CM

## 2017-04-01 DIAGNOSIS — G478 Other sleep disorders: Secondary | ICD-10-CM

## 2017-04-01 NOTE — Therapy (Signed)
[place] [facility] MAIN [facility] SERVICES [address] [place], [place], [address] Phone: [phone]   Fax:  [phone]  Physical Therapy Treatment  Patient Details  Name: Judith [name] MRN: [id number] Date of Birth: 1951-03-25 Referring Provider: Dr. Malvin Johns   Encounter Date: 04/01/2017  PT End of Session - 04/01/17 1310    Visit Number  3    Number of Visits  17    Date for PT Re-Evaluation  05/18/17    PT Start Time  0105    PT Stop Time  0145    PT Time Calculation (min)  40 min    Equipment Utilized During Treatment  Gait belt    Activity Tolerance  Patient tolerated treatment well;Patient limited by fatigue    Behavior During Therapy  [facility] for tasks assessed/performed       Past Medical History:  Diagnosis Date  . Anginal pain (HCC)   . Arthritis   . Asthma   . Depression   . Diabetes mellitus without complication (HCC)   . GERD (gastroesophageal reflux disease)   . Headache   . Sleep apnea     Past Surgical History:  Procedure Laterality Date  . ABDOMINAL HYSTERECTOMY    . BLADDER REPAIR    . CATARACT EXTRACTION Left 02/08/2013  . CHOLECYSTECTOMY    . ESOPHAGOGASTRODUODENOSCOPY (EGD) WITH PROPOFOL N/A 04/01/2016   Procedure: ESOPHAGOGASTRODUODENOSCOPY (EGD) WITH PROPOFOL;  Surgeon: Scot Jun, MD;  Location: [facility] ENDOSCOPY;  Service: Endoscopy;  Laterality: N/A;    There were no vitals filed for this visit.  Subjective Assessment - 04/01/17 1309    Subjective  Pt reports no falls since her last visit.  She states she is sore from her therapy on tuesday.    Pertinent History  atient is 66 year old that has had 2 falls without  knowing the cause , she passed out. The second time was november 7th and she was going up the step and had her hands full. She does not use a AD. she hit her left knee.  She had a n occipital block 1 week ago and now her headaches are goine. She has fibromyalgia and has mild pain in her shoulders, and  back . She has DDD in lumbar spine.     How long can you walk comfortably?  She is able to walk the dog and walk for and is unlimited with her time.     Patient Stated Goals  She wants to have better balance.     Currently in Pain?  No/denies    Multiple Pain Sites  No       Neuromuscular training: Tandem stand with head turns x 2 minutes Side stepping with head control Stepping onto AIREX, then on to 4 inch step followed by stepping down onto AIREX and then level surface in //bars x15.  Pt required occasional UE assist, and performance improved with each repetition   Stepping over and back x10 bilaterally   Stepping over and back x`10 bilaterally with verbal cueing to perform exercise as fast as possible Side step and back x10 bilaterally Side step and back x10 bilaterally with verbal cueing to perform exercise as fast as possible Side stepping on blue foam beam x 5 Tandem stand with theraball 3 lbs and elbows straight and trunk rotation Standing on blue foam feet apart and feet together with 2 lb rod and trunk rotaiton x 20x 2  There ex: Leg press with 90#x 10, sit to  stand 2x10   Mini squats in //bars x 20  CGA and Min to mod verbal cues used throughout with increased in postural sway and LOB most seen with narrow base of support and while on uneven surfaces. Continues to have balance deficits typical with diagnosis. Patient performs intermediate level exercises without pain behaviors and needs verbal cuing for postural alignment and head positioning                      PT Education - 04/01/17 1310    Education provided  Yes    Education Details  safety with transfers    Person(s) Educated  Patient    Methods  Explanation    Comprehension  Verbalized understanding       PT Short Term Goals - 03/30/17 1608      PT SHORT TERM GOAL #1   Title  Patient will be independent in home exercise program to improve strength/mobility for better functional independence  with ADLs.    Time  4    Period  Weeks    Status  New      PT SHORT TERM GOAL #2   Title  Patient (> 66 years old) will complete five times sit to stand test in < 15 seconds indicating an increased LE strength and improved balance.    Time  4    Period  Weeks    Status  New        PT Long Term Goals - 03/30/17 1608      PT LONG TERM GOAL #1   Title  Patient will increase Berg Balance score by > 6 points to demonstrate decreased fall risk during functional activities.    Time  8    Period  Weeks    Status  New      PT LONG TERM GOAL #2   Title  Patient will be independent with ascend/descend 12 steps using single UE in step over step pattern without LOB.    Time  8    Period  Weeks    Status  New      PT LONG TERM GOAL #3   Title  Patient will be require no assist with ascend/descend 3 steps using Least restrictive assistive device.    Time  8    Period  Weeks    Status  New            Plan - 04/01/17 1310    Clinical Impression Statement  Patient demonstrates decreased dynamic standing balance and requires verbal and tactile cueing to maintain center of gravity during dynamic balance activities with tandem stand and single leg activities.  Patient also requires CGA during all dynamic standing balance activities. Patient will continue to benefit from skilled PT to improve balance and decrease falls    Rehab Potential  Good    PT Frequency  2x / week    PT Duration  8 weeks    PT Treatment/Interventions  Therapeutic exercise;Therapeutic activities;Neuromuscular re-education;Balance training;Stair training;Functional mobility training    PT Next Visit Plan  balance training and ankle strengthening    PT Home Exercise Plan  heel raises, ankle eversion and resisted DF with RTB; kitchen sink SLS and tandem stance    Consulted and Agree with Plan of Care  Patient       Patient will benefit from skilled therapeutic intervention in order to improve the following deficits  and impairments:  Decreased balance, Difficulty walking, Decreased strength  Visit Diagnosis: Muscle weakness (generalized)  Difficulty waking     Problem List There are no active problems to display for this patient.   [address]Todrick Siedschlag S, [place] DPT 04/01/2017, 1:14 PM  [place] [facility] [address] [place], [place], [address] Phone: [phone]   Fax:  [phone]  Name: Rivka Saferatricia Bonham MRN: [id number] Date of Birth: 01/16/1951

## 2017-04-06 ENCOUNTER — Encounter: Payer: Self-pay | Admitting: Physical Therapy

## 2017-04-06 ENCOUNTER — Ambulatory Visit: Payer: Medicare Other | Admitting: Physical Therapy

## 2017-04-06 DIAGNOSIS — M6281 Muscle weakness (generalized): Secondary | ICD-10-CM | POA: Diagnosis not present

## 2017-04-06 DIAGNOSIS — G478 Other sleep disorders: Secondary | ICD-10-CM

## 2017-04-06 NOTE — Therapy (Signed)
[place] [facility] [address] [place], [place], [address] Phone: [phone]   Fax:  [phone]  Physical Therapy Treatment  Patient Details  Name: Judith [name] MRN: [id number] Date of Birth: 05/28/1950 Referring Provider: Dr. Malvin JohnsPotter   Encounter Date: 04/06/2017  PT End of Session - 04/06/17 1554    Visit Number  4    Number of Visits  17    Date for PT Re-Evaluation  05/18/17    PT Start Time  0350    PT Stop Time  0430    PT Time Calculation (min)  40 min    Equipment Utilized During Treatment  Gait belt    Activity Tolerance  Patient tolerated treatment well;Patient limited by fatigue    Behavior During Therapy  [facility] for tasks assessed/performed       Past Medical History:  Diagnosis Date  . Anginal pain (HCC)   . Arthritis   . Asthma   . Depression   . Diabetes mellitus without complication (HCC)   . GERD (gastroesophageal reflux disease)   . Headache   . Sleep apnea     Past Surgical History:  Procedure Laterality Date  . ABDOMINAL HYSTERECTOMY    . BLADDER REPAIR    . CATARACT EXTRACTION Left 02/08/2013  . CHOLECYSTECTOMY    . ESOPHAGOGASTRODUODENOSCOPY (EGD) WITH PROPOFOL N/A 04/01/2016   Procedure: ESOPHAGOGASTRODUODENOSCOPY (EGD) WITH PROPOFOL;  Surgeon: Scot Junobert T Elliott, MD;  [facility];  Service: Endoscopy;  Laterality: N/A;    There were no vitals filed for this visit.  Subjective Assessment - 04/06/17 1553    Subjective  Pt reports no falls since her last visit.    Pertinent History  atient is 66 year old that has had 2 falls without  knowing the cause , she passed out. The second time was november 7th and she was going up the step and had her hands full. She does not use a AD. she hit her left knee.  She had a n occipital block 1 week ago and now her headaches are goine. She has fibromyalgia and has mild pain in her shoulders, and back . She has DDD in lumbar spine.     How long can  you walk comfortably?  She is able to walk the dog and walk for and is unlimited with her time.     Patient Stated Goals  She wants to have better balance.     Currently in Pain?  No/denies    Multiple Pain Sites  No       NEUROMUSCULAR RE-EDUCATION Octane fitness x 5 mins warm up  BOSU ball squats 5 seconds each;cues for posture correction Toe tapping to stool from purple foam without UE assist, cues for technique Lunging to BOSU ball from purple foam without UE assist, cues for technique Tandem gait in // bars x 4 laps , posture correction cues  cues for posture and motor control Matrix fwd/ bwd/ side stepping x 10 17. 5 lbs  Cues for proper technique of exercises CGA and  mod verbal cues used throughout with increased in postural sway and LOB most seen with narrow base of support and while on uneven surfaces. Continues to have balance deficits typical with diagnosis. Patient performs intermediate level exercises without pain behaviors and needs verbal cuing for postural alignment and head positioning  PT Education - 04/06/17 1554    Education provided  Yes    Education Details  safety with steps    Person(s) Educated  Patient    Methods  Explanation    Comprehension  Verbalized understanding       PT Short Term Goals - 03/30/17 1608      PT SHORT TERM GOAL #1   Title  Patient will be independent in home exercise program to improve strength/mobility for better functional independence with ADLs.    Time  4    Period  Weeks    Status  New      PT SHORT TERM GOAL #2   Title  Patient (> 66 years old) will complete five times sit to stand test in < 15 seconds indicating an increased LE strength and improved balance.    Time  4    Period  Weeks    Status  New        PT Long Term Goals - 03/30/17 1608      PT LONG TERM GOAL #1   Title  Patient will increase Berg Balance score by > 6 points to demonstrate decreased fall risk during  functional activities.    Time  8    Period  Weeks    Status  New      PT LONG TERM GOAL #2   Title  Patient will be independent with ascend/descend 12 steps using single UE in step over step pattern without LOB.    Time  8    Period  Weeks    Status  New      PT LONG TERM GOAL #3   Title  Patient will be require no assist with ascend/descend 3 steps using Least restrictive assistive device.    Time  8    Period  Weeks    Status  New            Plan - 04/06/17 1556    Clinical Impression Statement  Continuous verbal cues and tactile cues needed to correct form with exercises and for posture correction. Patient required min verbal cues to perform matrix fwd/ bwd/ side stepping with weights for posture and control and required verbal and tactile cues during all dynamic standing balance activities.  Patient is able to perform strengthening exercises without reports of increased pain. Patient does need supervision during therapeutic exercises for correct from and technique. Patient will benefit from further skilled therapy to return to prior level of function.    Rehab Potential  Good    PT Frequency  2x / week    PT Duration  8 weeks    PT Treatment/Interventions  Therapeutic exercise;Therapeutic activities;Neuromuscular re-education;Balance training;Stair training;Functional mobility training    PT Next Visit Plan  balance training and ankle strengthening    PT Home Exercise Plan  heel raises, ankle eversion and resisted DF with RTB; kitchen sink SLS and tandem stance    Consulted and Agree with Plan of Care  Patient       Patient will benefit from skilled therapeutic intervention in order to improve the following deficits and impairments:  Decreased balance, Difficulty walking, Decreased strength  Visit Diagnosis: Muscle weakness (generalized)  Difficulty waking     Problem List There are no active problems to display for this patient.   Jones BroomMansfield, Shandon Burlingame S,PT  DPT 04/06/2017, 4:00 PM  [place] [facility] [address] [place], [place], [address] Phone: [phone]  Fax:  [phone]  Name: Judith [name] MRN: [id number] Date of Birth: 1950/05/15

## 2017-04-08 ENCOUNTER — Ambulatory Visit: Payer: Medicare Other | Admitting: Physical Therapy

## 2017-04-13 ENCOUNTER — Ambulatory Visit: Payer: Medicare Other | Admitting: Physical Therapy

## 2017-04-15 ENCOUNTER — Ambulatory Visit: Payer: Medicare Other | Admitting: Physical Therapy

## 2017-04-22 ENCOUNTER — Encounter: Payer: Self-pay | Admitting: Physical Therapy

## 2017-04-22 ENCOUNTER — Ambulatory Visit: Payer: Medicare Other | Admitting: Physical Therapy

## 2017-04-22 DIAGNOSIS — G478 Other sleep disorders: Secondary | ICD-10-CM

## 2017-04-22 DIAGNOSIS — M6281 Muscle weakness (generalized): Secondary | ICD-10-CM

## 2017-04-22 NOTE — Therapy (Signed)
[place] [facility] MAIN [facility] SERVICES [address] [place], [place], [address] Phone: [phone]   Fax:  [phone]  Physical Therapy Treatment  Patient Details  Name: Judith [name] MRN: [id number] Date of Birth: 1951/03/15 Referring Provider: Dr. Malvin Johns   Encounter Date: 04/22/2017  PT End of Session - 04/22/17 1309    Visit Number  5    Number of Visits  17    Date for PT Re-Evaluation  05/18/17    PT Start Time  0100    PT Stop Time  0140    PT Time Calculation (min)  40 min    Equipment Utilized During Treatment  Gait belt    Activity Tolerance  Patient tolerated treatment well;Patient limited by fatigue    Behavior During Therapy  [facility] for tasks assessed/performed       Past Medical History:  Diagnosis Date  . Anginal pain (HCC)   . Arthritis   . Asthma   . Depression   . Diabetes mellitus without complication (HCC)   . GERD (gastroesophageal reflux disease)   . Headache   . Sleep apnea     Past Surgical History:  Procedure Laterality Date  . ABDOMINAL HYSTERECTOMY    . BLADDER REPAIR    . CATARACT EXTRACTION Left 02/08/2013  . CHOLECYSTECTOMY    . ESOPHAGOGASTRODUODENOSCOPY (EGD) WITH PROPOFOL N/A 04/01/2016   Procedure: ESOPHAGOGASTRODUODENOSCOPY (EGD) WITH PROPOFOL;  Surgeon: Scot Jun, MD;  [facility] ENDOSCOPY;  Service: Endoscopy;  Laterality: N/A;    There were no vitals filed for this visit.  Subjective Assessment - 04/22/17 1308    Subjective  Pt reports no falls since her last visit. No new concerns.     Pertinent History  atient is 66 year old that has had 2 falls without  knowing the cause , she passed out. The second time was november 7th and she was going up the step and had her hands full. She does not use a AD. she hit her left knee.  She had a n occipital block 1 week ago and now her headaches are goine. She has fibromyalgia and has mild pain in her shoulders, and back . She has DDD in lumbar spine.      How long can you walk comfortably?  She is able to walk the dog and walk for and is unlimited with her time.     Patient Stated Goals  She wants to have better balance.     Currently in Pain?  Yes    Pain Score  7     Pain Location  Shoulder    Pain Orientation  Left    Pain Descriptors / Indicators  Aching    Pain Onset  More than a month ago    Pain Frequency  Intermittent    Aggravating Factors   using it    Pain Relieving Factors  rest    Multiple Pain Sites  No       Neuromuscular training: Tandem stand on floor with head turns x 2 minutes, CGA  Stepping onto AIREX, then on to 4 inch step followed by stepping down onto AIREX and then level surface in //bars x15.  Pt required occasional UE assist, and performance improved with each repetition    Stepping over 1/2 foam and back x10 bilaterally  , CGA  Side step and back x10 bilaterally  Side stepping on blue foam beam x 5  2 disk in stepping pattern; head turns left  and right and cues to not use UE support  Step up to 6 inch stool from purple foam pad x 20 , CGA  Walking tandem balance beam with cues for posture and head position, SBA  Rocker board left and right without UE support ,SBA  Rocker board fwd/bwd without UE suppport, SBA   foam flat side up, with feet apart and head turns and CGA and no UE support   foam curve side up with feet together and CGA without UE support    There ex: Leg press with 90#x 10, followed by 100# x 20 x 2  Mini squats in //bars x 15 x 2   Eccentric step downs from 6 inch stool x 5 left and right x 4 sets   CGA and Min to mod verbal cues used throughout with increased in postural sway and LOB most seen with narrow base of support and while on uneven surfaces. Continues to have balance deficits typical with diagnosis. Patient performs intermediate level exercises with reports of fatigue in arches of feet but no pain , cues for better control.                 PT  Education - 04/22/17 1309    Education provided  Yes    Education Details  safety with steps    Person(s) Educated  Patient    Methods  Explanation;Verbal cues    Comprehension  Verbalized understanding       PT Short Term Goals - 03/30/17 1608      PT SHORT TERM GOAL #1   Title  Patient will be independent in home exercise program to improve strength/mobility for better functional independence with ADLs.    Time  4    Period  Weeks    Status  New      PT SHORT TERM GOAL #2   Title  Patient (> 66 years old) will complete five times sit to stand test in < 15 seconds indicating an increased LE strength and improved balance.    Time  4    Period  Weeks    Status  New        PT Long Term Goals - 03/30/17 1608      PT LONG TERM GOAL #1   Title  Patient will increase Berg Balance score by > 6 points to demonstrate decreased fall risk during functional activities.    Time  8    Period  Weeks    Status  New      PT LONG TERM GOAL #2   Title  Patient will be independent with ascend/descend 12 steps using single UE in step over step pattern without LOB.    Time  8    Period  Weeks    Status  New      PT LONG TERM GOAL #3   Title  Patient will be require no assist with ascend/descend 3 steps using Least restrictive assistive device.    Time  8    Period  Weeks    Status  New            Plan - 04/22/17 1310    Clinical Impression Statement  Patient demonstrates LOB with standing balance exercises indicating decreased balancing strategies. Patient did not require UE support to perform sidestepping up and over exercise with functional carryover between visits. Patient will benefit from further skilled therapy to return to prior level of function. .  Pt was encouraged to perform  HEP during the week in order to continue progressing balance and strength interventions.  Pt would continue to benefit from skilled therapy services in order to further address LE strength deficits and  balance deficits in order to decrease fall risk and improve mobility    Rehab Potential  Good    PT Frequency  2x / week    PT Duration  8 weeks    PT Treatment/Interventions  Therapeutic exercise;Therapeutic activities;Neuromuscular re-education;Balance training;Stair training;Functional mobility training    PT Next Visit Plan  balance training and ankle strengthening    PT Home Exercise Plan  heel raises, ankle eversion and resisted DF with RTB; kitchen sink SLS and tandem stance    Consulted and Agree with Plan of Care  Patient       Patient will benefit from skilled therapeutic intervention in order to improve the following deficits and impairments:  Decreased balance, Difficulty walking, Decreased strength  Visit Diagnosis: Muscle weakness (generalized)  Difficulty waking     Problem List There are no active problems to display for this patient.   401 Jockey Hollow StreetMansfield, Kristine S, [place]PT DPT 04/22/2017, 1:11 PM  [place] [facility] [address] [place], [place], [address] Phone: [phone]   Fax:  [phone]  Name: Judith [name] MRN: [id number] Date of Birth: 11/07/1950

## 2017-04-26 ENCOUNTER — Ambulatory Visit: Payer: Medicare Other | Admitting: Physical Therapy

## 2017-04-26 ENCOUNTER — Encounter: Payer: Self-pay | Admitting: Physical Therapy

## 2017-04-26 DIAGNOSIS — G478 Other sleep disorders: Secondary | ICD-10-CM

## 2017-04-26 DIAGNOSIS — M6281 Muscle weakness (generalized): Secondary | ICD-10-CM

## 2017-04-26 NOTE — Therapy (Signed)
[place] [facility] [address] [place], [place], [address] Phone: [phone]   Fax:  [phone]  Physical Therapy Treatment  Patient Details  Name: Rivka Saferatricia Cavey MRN: [id number] Date of Birth: 07/17/1950 Referring Provider: Dr. Malvin JohnsPotter   Encounter Date: 04/26/2017  PT End of Session - 04/26/17 1453    Visit Number  6    Number of Visits  17    Date for PT Re-Evaluation  05/18/17    PT Start Time  0245    PT Stop Time  0330    PT Time Calculation (min)  45 min    Equipment Utilized During Treatment  Gait belt    Activity Tolerance  Patient tolerated treatment well;Patient limited by fatigue    Behavior During Therapy  [facility] for tasks assessed/performed       Past Medical History:  Diagnosis Date  . Anginal pain (HCC)   . Arthritis   . Asthma   . Depression   . Diabetes mellitus without complication (HCC)   . GERD (gastroesophageal reflux disease)   . Headache   . Sleep apnea     Past Surgical History:  Procedure Laterality Date  . ABDOMINAL HYSTERECTOMY    . BLADDER REPAIR    . CATARACT EXTRACTION Left 02/08/2013  . CHOLECYSTECTOMY    . ESOPHAGOGASTRODUODENOSCOPY (EGD) WITH PROPOFOL N/A 04/01/2016   Procedure: ESOPHAGOGASTRODUODENOSCOPY (EGD) WITH PROPOFOL;  Surgeon: Scot Junobert T Elliott, MD;  [facility];  Service: Endoscopy;  Laterality: N/A;    There were no vitals filed for this visit.  Subjective Assessment - 04/26/17 1452    Subjective  Pt reports no falls since her last visit. No new concerns.     Pertinent History  atient is 66 year old that has had 2 falls without  knowing the cause , she passed out. The second time was november 7th and she was going up the step and had her hands full. She does not use a AD. she hit her left knee.  She had a n occipital block 1 week ago and now her headaches are goine. She has fibromyalgia and has mild pain in her shoulders, and back . She has DDD in lumbar spine.      How long can you walk comfortably?  She is able to walk the dog and walk for and is unlimited with her time.     Patient Stated Goals  She wants to have better balance.     Currently in Pain?  Yes    Pain Score  7     Pain Location  Shoulder    Pain Orientation  Left    Pain Type  Chronic pain    Pain Onset  More than a month ago    Pain Frequency  Intermittent    Aggravating Factors   using it    Pain Relieving Factors  rest    Multiple Pain Sites  No       Therapeutic exercise and neuromuscular training: TM walking 1.5 m/hour x 5 mins Eccentric step downs with UE support x 5 x 4 sets: 1/2 foam flat side up and balance with head turns left and right feet apart and feet together,x 2 min  standing hip abd with YTB x 20  side stepping left and right in parallel bars 10 feet x 3 step ups from floor to 6 inch stool x 20 bilateral marching in parallel bars x 20 Tilt board fwd/bwd, side to side left  and right x 2 mins Stepping over bolster left and right and fwd/bwd x 15  Heel raises 10 x 3 sets NBOS feet together on airex with eyes close 15 sec x 5 sets NBOS feet together on airex with  head movement 30 sec x 3 sets Tandem stance on airex 20 sec x 2 sets Standing hip abduction, flexion and extension in // bars 10 x 2 sets bilaterally  Pt required min to mod cues for correct exercise sequencing with verbal and tactile cues and CGA for safety Pt required min to mod assistance for safety with min verbal cues for correct form                       PT Education - 04/26/17 1452    Education provided  Yes    Education Details  safety and exercise techniques    Person(s) Educated  Patient    Methods  Explanation    Comprehension  Verbalized understanding;Returned demonstration       PT Short Term Goals - 03/30/17 1608      PT SHORT TERM GOAL #1   Title  Patient will be independent in home exercise program to improve strength/mobility for better functional  independence with ADLs.    Time  4    Period  Weeks    Status  New      PT SHORT TERM GOAL #2   Title  Patient (> 89 years old) will complete five times sit to stand test in < 15 seconds indicating an increased LE strength and improved balance.    Time  4    Period  Weeks    Status  New        PT Long Term Goals - 03/30/17 1608      PT LONG TERM GOAL #1   Title  Patient will increase Berg Balance score by > 6 points to demonstrate decreased fall risk during functional activities.    Time  8    Period  Weeks    Status  New      PT LONG TERM GOAL #2   Title  Patient will be independent with ascend/descend 12 steps using single UE in step over step pattern without LOB.    Time  8    Period  Weeks    Status  New      PT LONG TERM GOAL #3   Title  Patient will be require no assist with ascend/descend 3 steps using Least restrictive assistive device.    Time  8    Period  Weeks    Status  New            Plan - 04/26/17 1453    Clinical Impression Statement  Patient demonstrates decreased dynamic standing balance and requires verbal and tactile cueing to maintain center of gravity during dynamic balance activities. Patient also requires CGA during all dynamic standing balance activities. Patient will continue to benefit from skilled PT to improve balance and decrease falls    Rehab Potential  Good    PT Frequency  2x / week    PT Duration  8 weeks    PT Treatment/Interventions  Therapeutic exercise;Therapeutic activities;Neuromuscular re-education;Balance training;Stair training;Functional mobility training    PT Next Visit Plan  balance training and ankle strengthening    PT Home Exercise Plan  heel raises, ankle eversion and resisted DF with RTB; kitchen sink SLS and tandem stance    Consulted and Agree  with Plan of Care  Patient       Patient will benefit from skilled therapeutic intervention in order to improve the following deficits and impairments:  Decreased  balance, Difficulty walking, Decreased strength  Visit Diagnosis: Muscle weakness (generalized)  Difficulty waking     Problem List There are no active problems to display for this patient.   Jones Broom S PT DPT 04/26/2017, 2:55 PM  [place] [facility] MAIN [facility] SERVICES [address] [place], [place], [address] Phone: [phone]   Fax:  [phone]  Name: Damani Rando MRN: [id number] Date of Birth: 06-Apr-1951

## 2017-04-29 ENCOUNTER — Encounter: Payer: Self-pay | Admitting: Physical Therapy

## 2017-04-29 ENCOUNTER — Ambulatory Visit: Payer: Medicare Other | Attending: Neurology | Admitting: Physical Therapy

## 2017-04-29 DIAGNOSIS — G478 Other sleep disorders: Secondary | ICD-10-CM | POA: Insufficient documentation

## 2017-04-29 DIAGNOSIS — M6281 Muscle weakness (generalized): Secondary | ICD-10-CM | POA: Diagnosis present

## 2017-04-29 NOTE — Therapy (Signed)
[facility] MAIN [facility] SERVICES [address] [place], [place], [address] Phone: [phone]   Fax:  [phone]  Physical Therapy Treatment  Patient Details  Name: Judith [name] MRN: [id number] Date of Birth: 1951-03-09 Referring Provider: Dr. Malvin Johns   Encounter Date: 04/29/2017  PT End of Session - 04/29/17 1535    Visit Number  7    Number of Visits  17    Date for PT Re-Evaluation  05/18/17    PT Start Time  0330    PT Stop Time  0410    PT Time Calculation (min)  40 min    Equipment Utilized During Treatment  Gait belt    Activity Tolerance  Patient tolerated treatment well;Patient limited by fatigue    Behavior During Therapy  [facility] for tasks assessed/performed       Past Medical History:  Diagnosis Date  . Anginal pain (HCC)   . Arthritis   . Asthma   . Depression   . Diabetes mellitus without complication (HCC)   . GERD (gastroesophageal reflux disease)   . Headache   . Sleep apnea     Past Surgical History:  Procedure Laterality Date  . ABDOMINAL HYSTERECTOMY    . BLADDER REPAIR    . CATARACT EXTRACTION Left 02/08/2013  . CHOLECYSTECTOMY    . ESOPHAGOGASTRODUODENOSCOPY (EGD) WITH PROPOFOL N/A 04/01/2016   Procedure: ESOPHAGOGASTRODUODENOSCOPY (EGD) WITH PROPOFOL;  Surgeon: Scot Jun, MD;  Location: [facility] ENDOSCOPY;  Service: Endoscopy;  Laterality: N/A;    There were no vitals filed for this visit.  Subjective Assessment - 04/29/17 1534    Subjective  Pt reports no falls since her last visit. No new concerns.     Pertinent History  atient is 67 year old that has had 2 falls without  knowing the cause , she passed out. The second time was november 7th and she was going up the step and had her hands full. She does not use a AD. she hit her left knee.  She had a n occipital block 1 week ago and now her headaches are goine. She has fibromyalgia and has mild pain in her shoulders, and back . She has DDD in lumbar spine.      How long can you walk comfortably?  She is able to walk the dog and walk for and is unlimited with her time.     Patient Stated Goals  She wants to have better balance.     Currently in Pain?  No/denies    Pain Score  2     Pain Location  Calf    Pain Orientation  Right;Left    Pain Descriptors / Indicators  Sore    Pain Type  Acute pain    Pain Onset  More than a month ago    Multiple Pain Sites  No       Therapeutic Exercise:  Octane fitness x 5 mins  TM x 5 mins with 1 .5 miles / hour and elevation 1 B LE standing hip SLR, abd, ext 2x10 with GTB LAQs with GTB 2x10 Mini squats 2x10 Heel raises 2x10 Wall squats 2x10 Leg press 2 x 20  with 90#, 2x10 with 100#; min verbal cues for proper technique CGA when stepping up with LLE and min A for RLE due to increased weakness and unsteadiness with RLE  Resisted walking fwd bilateral side stepping with12.5# x 3 laps each; min A throughout due to patient being unsteady and losing  balance multiple times throughout each direction; increased time to complete task Fwd step ups with 6" step with 1 UE support, 1x10 each; min verbal cues for proper technique Cues for proper technique of exercises, slow eccentric contractions to target specific muscles and facility increased muscle building. Therapeutic rest breaks for energy conservation                        PT Education - 04/29/17 1535    Education provided  Yes    Education Details  safety with steps    Person(s) Educated  Patient    Methods  Explanation    Comprehension  Verbalized understanding;Returned demonstration       PT Short Term Goals - 03/30/17 1608      PT SHORT TERM GOAL #1   Title  Patient will be independent in home exercise program to improve strength/mobility for better functional independence with ADLs.    Time  4    Period  Weeks    Status  New      PT SHORT TERM GOAL #2   Title  Patient (> 67 years old) will complete five times sit to  stand test in < 15 seconds indicating an increased LE strength and improved balance.    Time  4    Period  Weeks    Status  New        PT Long Term Goals - 03/30/17 1608      PT LONG TERM GOAL #1   Title  Patient will increase Berg Balance score by > 6 points to demonstrate decreased fall risk during functional activities.    Time  8    Period  Weeks    Status  New      PT LONG TERM GOAL #2   Title  Patient will be independent with ascend/descend 12 steps using single UE in step over step pattern without LOB.    Time  8    Period  Weeks    Status  New      PT LONG TERM GOAL #3   Title  Patient will be require no assist with ascend/descend 3 steps using Least restrictive assistive device.    Time  8    Period  Weeks    Status  New            Plan - 04/29/17 1536    Clinical Impression Statement  Patient demonstrates LOB with standing balance exercises indicating decreased balancing strategies. Pt was able to progress dynamic balance exercises today, noting improved postural reactions with LOB and moving outside normal BOS.  Pt was able to perform all exercises on uneven surfaces with min assist. Patient struggles with speed during movement as well as balance with unstable surfaces. Pt encouraged to continue HEP .Follow-up as scheduled.    Rehab Potential  Good    PT Frequency  2x / week    PT Duration  8 weeks    PT Treatment/Interventions  Therapeutic exercise;Therapeutic activities;Neuromuscular re-education;Balance training;Stair training;Functional mobility training    PT Next Visit Plan  balance training and ankle strengthening    PT Home Exercise Plan  heel raises, ankle eversion and resisted DF with RTB; kitchen sink SLS and tandem stance    Consulted and Agree with Plan of Care  Patient       Patient will benefit from skilled therapeutic intervention in order to improve the following deficits and impairments:  Decreased balance, Difficulty walking,  Decreased  strength  Visit Diagnosis: Muscle weakness (generalized)  Difficulty waking     Problem List There are no active problems to display for this patient.   [address], [place] DPT 04/29/2017, 3:38 PM  [facility] MAIN [facility] SERVICES [address] [place], [place], [address] Phone: [phone]   Fax:  [phone]  Name: Judith [name] MRN: [id number] Date of Birth: [birth year]

## 2017-05-03 ENCOUNTER — Ambulatory Visit: Payer: Medicare Other | Admitting: Physical Therapy

## 2017-05-03 ENCOUNTER — Encounter: Payer: Self-pay | Admitting: Physical Therapy

## 2017-05-03 DIAGNOSIS — M6281 Muscle weakness (generalized): Secondary | ICD-10-CM

## 2017-05-03 DIAGNOSIS — G478 Other sleep disorders: Secondary | ICD-10-CM

## 2017-05-03 NOTE — Therapy (Signed)
[place] MAIN [facility] SERVICES [address] [place], [place], [address] Phone: [phone]   Fax:  [phone]  Physical Therapy Treatment/Progress Note  Patient Details  Name: Judith [name] MRN: [id number] Date of Birth: Jul 05, 1950 Referring Provider: Dr. Melrose Nakayama   Encounter Date: 05/03/2017  PT End of Session - 05/03/17 1109    Visit Number  8    Number of Visits  17    Date for PT Re-Evaluation  05/18/17    PT Start Time  1100    PT Stop Time  1145    PT Time Calculation (min)  45 min    Equipment Utilized During Treatment  Gait belt    Activity Tolerance  Patient tolerated treatment well;Patient limited by fatigue    Behavior During Therapy  [facility] for tasks assessed/performed       Past Medical History:  Diagnosis Date  . Anginal pain ([place])   . Arthritis   . Asthma   . Depression   . Diabetes mellitus without complication ([place])   . GERD (gastroesophageal reflux disease)   . Headache   . Sleep apnea     Past Surgical History:  Procedure Laterality Date  . ABDOMINAL HYSTERECTOMY    . BLADDER REPAIR    . CATARACT EXTRACTION Left 02/08/2013  . CHOLECYSTECTOMY    . ESOPHAGOGASTRODUODENOSCOPY (EGD) WITH PROPOFOL N/A 04/01/2016   Procedure: ESOPHAGOGASTRODUODENOSCOPY (EGD) WITH PROPOFOL;  Surgeon: Manya Silvas, MD;  Location: [facility] ENDOSCOPY;  Service: Endoscopy;  Laterality: N/A;    There were no vitals filed for this visit.  Subjective Assessment - 05/03/17 1107    Subjective  Pt reports no falls since her last visit. No new concerns. She was sick over the weekned and is feeling better now. she feels that her balance is improving but she stubbed her toe on a step going upa step without a railing.    Pertinent History  atient is 67 year old that has had 2 falls without  knowing the cause , she passed out. The second time was november 7th and she was going up the step and had her hands full. She does not use a AD. she hit her left  knee.  She had a n occipital block 1 week ago and now her headaches are goine. She has fibromyalgia and has mild pain in her shoulders, and back . She has DDD in lumbar spine.     How long can you walk comfortably?  She is able to walk the dog and walk for and is unlimited with her time.     Patient Stated Goals  She wants to have better balance.     Currently in Pain?  No/denies    Pain Score  0-No pain    Pain Onset  More than a month ago    Multiple Pain Sites  No      Treatment: Octane fitness x 5 mins ( warm up) Performed Berg balance test with 56/56 Performed 5 x sit to stand test Reviewed ascending / descending steps without rails for goals Instructed in gym machine exercises with plate 3 for knee extension and plate 5 for knee flex x 12 reps x 2 sets No pain following treatment Discussed HEP options including silver sneakers options                         PT Education - 05/03/17 1109    Education provided  Yes  Education Details  goals and progress    Person(s) Educated  Patient    Methods  Explanation;Demonstration    Comprehension  Verbalized understanding;Returned demonstration;Verbal cues required       PT Short Term Goals - 05/03/17 1123      PT SHORT TERM GOAL #1   Title  Patient will be independent in home exercise program to improve strength/mobility for better functional independence with ADLs.    Baseline  independent    Time  4    Period  Weeks    Status  Achieved      PT SHORT TERM GOAL #2   Title  Patient (> 33 years old) will complete five times sit to stand test in < 15 seconds indicating an increased LE strength and improved balance.    Baseline  14.66 sec    Time  4    Period  Weeks    Status  Achieved        PT Long Term Goals - 05/03/17 1111      PT LONG TERM GOAL #1   Title  Patient will increase Berg Balance score by > 6 points to demonstrate decreased fall risk during functional activities.    Baseline  56/56     Time  8    Period  Weeks    Status  Achieved    Target Date  05/18/17      PT LONG TERM GOAL #2   Title  Patient will be independent with ascend/descend 12 steps using single UE in step over step pattern without LOB.    Baseline  independent    Time  8    Period  Weeks    Status  Achieved    Target Date  05/18/17      PT LONG TERM GOAL #3   Title  Patient will be require no assist with ascend/descend 3 steps using Least restrictive assistive device.    Baseline  no device and independent    Time  8    Period  Weeks    Status  Achieved    Target Date  05/18/17            Plan - 05/03/17 1110    Clinical Impression Statement  Patient performs outcome meausres and she met her goals including Berg balance 56/56 and 5 x sit to stand under 15 seconds. She will continue for one more visit to review HEP for higher level balance and gym  machine options. She will benefit form skilled PT for one more visit and then be discharged to HEP.    Rehab Potential  Good    PT Frequency  2x / week    PT Duration  8 weeks    PT Treatment/Interventions  Therapeutic exercise;Therapeutic activities;Neuromuscular re-education;Balance training;Stair training;Functional mobility training    PT Next Visit Plan  balance training and ankle strengthening    PT Home Exercise Plan  heel raises, ankle eversion and resisted DF with RTB; kitchen sink SLS and tandem stance    Consulted and Agree with Plan of Care  Patient       Patient will benefit from skilled therapeutic intervention in order to improve the following deficits and impairments:  Decreased balance, Difficulty walking, Decreased strength  Visit Diagnosis: Muscle weakness (generalized)  Difficulty waking     Problem List There are no active problems to display for this patient.   Alanson Puls, [place] DPT 05/03/2017, 11:57 AM  [facility]  [place],  [place], [address] Phone: [phone]   Fax:  [phone]  Name: Judith [name] MRN: [id number] Date of Birth: 1950/09/09

## 2017-05-05 ENCOUNTER — Ambulatory Visit: Payer: Medicare Other | Admitting: Physical Therapy

## 2017-05-05 ENCOUNTER — Encounter: Payer: Self-pay | Admitting: Physical Therapy

## 2017-05-05 DIAGNOSIS — M6281 Muscle weakness (generalized): Secondary | ICD-10-CM | POA: Diagnosis not present

## 2017-05-05 DIAGNOSIS — G478 Other sleep disorders: Secondary | ICD-10-CM

## 2017-05-05 NOTE — Patient Instructions (Signed)
Plank    Support body on hands and feet. Keep hips in line with torso and arms straight under chest. Avoid locking elbows. Hold for ____ breaths. Repeat with other leg. ADVANCED: Extend one leg up.  Copyright  VHI. All rights reserved.  Heel Raises    Stand with support. Tighten pelvic floor and hold. With knees straight, raise heels off ground. Hold ___ seconds. Relax for ___ seconds. Repeat ___ times. Do ___ times a day.  Copyright  VHI. All rights reserved.  Hip Extension (Standing)    Stand with support. Squeeze pelvic floor and hold. Move right leg backward with straight knee. Hold for ___ seconds. Relax for ___ seconds. Repeat ___ times. Do ___ times a day. Repeat with other leg. Add ___ lb weight.   Copyright  VHI. All rights reserved.  Hip Abduction (Standing)    Stand with support. Squeeze pelvic floor and hold. Lift right leg out to side, keeping toe forward. Hold for ___ seconds. Relax for ___ seconds.  Repeat ___ times. Do ___ times a day. Repeat with other leg. Add ___ lb weight.   Copyright  VHI. All rights reserved.

## 2017-05-05 NOTE — Therapy (Signed)
[place] [facility] MAIN [facility] SERVICES [address] [place], [place], [address] Phone: [phone]   Fax:  [phone]  Physical Therapy Treatment/ Discharge Summary  Patient Details  Name: Judith [name] MRN: [id number] Date of Birth: 20-Nov-1950 Referring Provider: Dr. Malvin Johns   Encounter Date: 05/05/2017  PT End of Session - 05/05/17 1119    Visit Number  9    Number of Visits  17    Date for PT Re-Evaluation  05/18/17    PT Start Time  1100    PT Stop Time  1140    PT Time Calculation (min)  40 min    Equipment Utilized During Treatment  Gait belt    Activity Tolerance  Patient tolerated treatment well;Patient limited by fatigue    Behavior During Therapy  [facility] for tasks assessed/performed       Past Medical History:  Diagnosis Date  . Anginal pain (HCC)   . Arthritis   . Asthma   . Depression   . Diabetes mellitus without complication (HCC)   . GERD (gastroesophageal reflux disease)   . Headache   . Sleep apnea     Past Surgical History:  Procedure Laterality Date  . ABDOMINAL HYSTERECTOMY    . BLADDER REPAIR    . CATARACT EXTRACTION Left 02/08/2013  . CHOLECYSTECTOMY    . ESOPHAGOGASTRODUODENOSCOPY (EGD) WITH PROPOFOL N/A 04/01/2016   Procedure: ESOPHAGOGASTRODUODENOSCOPY (EGD) WITH PROPOFOL;  Surgeon: Scot Jun, MD;  Location: [facility] ENDOSCOPY;  Service: Endoscopy;  Laterality: N/A;    There were no vitals filed for this visit.  Subjective Assessment - 05/05/17 1123    Subjective  Patient reports no pain today and wants to join the hospital gym for HEP after her discharge.    Pertinent History  Patient is 67 year old that has had 2 falls without  knowing the cause , she passed out. The second time was november 7th and she was going up the step and had her hands full. She does not use a AD. she hit her left knee.  She had a n occipital block 1 week ago and now her headaches are goine. She has fibromyalgia and has mild pain in  her shoulders, and back . She has DDD in lumbar spine.     How long can you walk comfortably?  She is able to walk the dog and walk for and is unlimited with her time.     Patient Stated Goals  She wants to have better balance.     Currently in Pain?  No/denies    Pain Score  0-No pain    Pain Onset  More than a month ago    Multiple Pain Sites  No     Treatment: Neuromuscular training; Standing tandem 1/2 foam with head turns x 2 mins Standing with GTB hip abd x 15 BLE Standing with GTB hip ext x 15 BLE Single leg bridge x 10 x 2 SLR with intervals x 2 and 10 reps with ER Heel raises x 20  Patient was given written documentation for HEP.                           PT Education - 05/05/17 1118    Education provided  Yes    Education Details  safety with balance    Person(s) Educated  Patient    Methods  Explanation;Verbal cues;Tactile cues    Comprehension  Verbalized understanding  PT Short Term Goals - 05/03/17 1123      PT SHORT TERM GOAL #1   Title  Patient will be independent in home exercise program to improve strength/mobility for better functional independence with ADLs.    Baseline  independent    Time  4    Period  Weeks    Status  Achieved      PT SHORT TERM GOAL #2   Title  Patient (> 67 years old) will complete five times sit to stand test in < 15 seconds indicating an increased LE strength and improved balance.    Baseline  14.66 sec    Time  4    Period  Weeks    Status  Achieved        PT Long Term Goals - 05/03/17 1111      PT LONG TERM GOAL #1   Title  Patient will increase Berg Balance score by > 6 points to demonstrate decreased fall risk during functional activities.    Baseline  56/56    Time  8    Period  Weeks    Status  Achieved    Target Date  05/18/17      PT LONG TERM GOAL #2   Title  Patient will be independent with ascend/descend 12 steps using single UE in step over step pattern without LOB.     Baseline  independent    Time  8    Period  Weeks    Status  Achieved    Target Date  05/18/17      PT LONG TERM GOAL #3   Title  Patient will be require no assist with ascend/descend 3 steps using Least restrictive assistive device.    Baseline  no device and independent    Time  8    Period  Weeks    Status  Achieved    Target Date  05/18/17            Plan - 05/05/17 1128    Clinical Impression Statement  Patient performs standing exercises for HEP with theraband . She performes exercises independently after instruction and education. She does get 2/10 pain in the arch of her foot after challenging dynamic standing balance and fatiguing her foot muscles.  She is interested in joining the hospital gym to exercise. She reached her goals and is able to continue with HEP and will be discharged today.     Rehab Potential  Good    PT Frequency  2x / week    PT Duration  8 weeks    PT Treatment/Interventions  Therapeutic exercise;Therapeutic activities;Neuromuscular re-education;Balance training;Stair training;Functional mobility training    PT Next Visit Plan  balance training and ankle strengthening    PT Home Exercise Plan  heel raises, ankle eversion and resisted DF with RTB; kitchen sink SLS and tandem stance    Consulted and Agree with Plan of Care  Patient       Patient will benefit from skilled therapeutic intervention in order to improve the following deficits and impairments:  Decreased balance, Difficulty walking, Decreased strength  Visit Diagnosis: Muscle weakness (generalized)  Difficulty waking     Problem List There are no active problems to display for this patient.   [address], [place], [place] D[place] 05/05/2017, 2:11 PM  [place] [facility] [address] [place], [place], [address] Phone: [phone]   Fax:  [phone]  Name: Judith [name] MRN: [id number] Date of  Birth: 1950-09-16

## 2017-05-10 ENCOUNTER — Ambulatory Visit: Payer: Medicare Other | Admitting: Physical Therapy

## 2017-05-12 ENCOUNTER — Ambulatory Visit: Payer: Medicare Other | Admitting: Physical Therapy

## 2017-05-17 ENCOUNTER — Ambulatory Visit: Payer: Medicare Other | Admitting: Physical Therapy

## 2017-05-19 ENCOUNTER — Ambulatory Visit: Payer: Medicare Other | Admitting: Physical Therapy

## 2017-09-01 DIAGNOSIS — E1122 Type 2 diabetes mellitus with diabetic chronic kidney disease: Secondary | ICD-10-CM | POA: Insufficient documentation

## 2017-09-01 DIAGNOSIS — E1142 Type 2 diabetes mellitus with diabetic polyneuropathy: Secondary | ICD-10-CM | POA: Insufficient documentation

## 2017-09-03 ENCOUNTER — Other Ambulatory Visit: Payer: Self-pay | Admitting: Physician Assistant

## 2017-09-03 DIAGNOSIS — IMO0001 Reserved for inherently not codable concepts without codable children: Secondary | ICD-10-CM

## 2017-09-03 DIAGNOSIS — H9042 Sensorineural hearing loss, unilateral, left ear, with unrestricted hearing on the contralateral side: Secondary | ICD-10-CM

## 2017-09-11 ENCOUNTER — Ambulatory Visit: Payer: Medicare Other

## 2017-09-14 ENCOUNTER — Ambulatory Visit
Admission: RE | Admit: 2017-09-14 | Discharge: 2017-09-14 | Disposition: A | Discharge status: Home / Self Care | Payer: Medicare Other | Source: Ambulatory Visit | Attending: Physician Assistant | Admitting: Physician Assistant

## 2017-09-14 DIAGNOSIS — G936 Cerebral edema: Secondary | ICD-10-CM | POA: Insufficient documentation

## 2017-09-14 DIAGNOSIS — G319 Degenerative disease of nervous system, unspecified: Secondary | ICD-10-CM | POA: Insufficient documentation

## 2017-09-14 DIAGNOSIS — IMO0001 Reserved for inherently not codable concepts without codable children: Secondary | ICD-10-CM

## 2017-09-14 DIAGNOSIS — H9042 Sensorineural hearing loss, unilateral, left ear, with unrestricted hearing on the contralateral side: Secondary | ICD-10-CM | POA: Diagnosis present

## 2017-09-14 LAB — POCT I-STAT CREATININE: CREATININE: 0.8 mg/dL (ref 0.44–1.00)

## 2017-09-14 MED ORDER — GADOBENATE DIMEGLUMINE 529 MG/ML IV SOLN
20.0000 mL | Freq: Once | INTRAVENOUS | Status: AC | PRN
  Administered 2017-09-14: 20 mL via INTRAVENOUS

## 2017-12-07 DIAGNOSIS — R42 Dizziness and giddiness: Secondary | ICD-10-CM | POA: Insufficient documentation

## 2018-03-09 DIAGNOSIS — M5481 Occipital neuralgia: Secondary | ICD-10-CM | POA: Insufficient documentation

## 2018-05-23 ENCOUNTER — Other Ambulatory Visit: Payer: Self-pay | Admitting: Internal Medicine

## 2018-05-23 DIAGNOSIS — Z1231 Encounter for screening mammogram for malignant neoplasm of breast: Secondary | ICD-10-CM

## 2018-05-24 ENCOUNTER — Ambulatory Visit
Admission: RE | Admit: 2018-05-24 | Discharge: 2018-05-24 | Disposition: A | Discharge status: Home / Self Care | Payer: Medicare Other | Source: Ambulatory Visit | Attending: Internal Medicine | Admitting: Internal Medicine

## 2018-05-24 DIAGNOSIS — Z1231 Encounter for screening mammogram for malignant neoplasm of breast: Secondary | ICD-10-CM | POA: Insufficient documentation

## 2018-06-15 DIAGNOSIS — E66812 Obesity, class 2: Secondary | ICD-10-CM | POA: Insufficient documentation

## 2019-01-12 DIAGNOSIS — Z0000 Encounter for general adult medical examination without abnormal findings: Secondary | ICD-10-CM | POA: Insufficient documentation

## 2019-03-29 ENCOUNTER — Other Ambulatory Visit: Payer: Self-pay

## 2019-03-29 DIAGNOSIS — Z20822 Contact with and (suspected) exposure to covid-19: Secondary | ICD-10-CM

## 2019-04-01 LAB — NOVEL CORONAVIRUS, NAA: SARS-CoV-2, NAA: NOT DETECTED

## 2019-05-04 ENCOUNTER — Other Ambulatory Visit: Payer: Self-pay | Admitting: Family Medicine

## 2019-05-04 DIAGNOSIS — Z1231 Encounter for screening mammogram for malignant neoplasm of breast: Secondary | ICD-10-CM

## 2019-05-22 ENCOUNTER — Other Ambulatory Visit: Payer: Self-pay | Admitting: Physical Medicine and Rehabilitation

## 2019-05-22 DIAGNOSIS — M5412 Radiculopathy, cervical region: Secondary | ICD-10-CM

## 2019-05-29 ENCOUNTER — Ambulatory Visit
Admission: RE | Admit: 2019-05-29 | Discharge: 2019-05-29 | Disposition: A | Discharge status: Home / Self Care | Payer: Medicare PPO | Source: Ambulatory Visit | Attending: Family Medicine | Admitting: Family Medicine

## 2019-05-29 DIAGNOSIS — Z1231 Encounter for screening mammogram for malignant neoplasm of breast: Secondary | ICD-10-CM | POA: Insufficient documentation

## 2019-05-30 ENCOUNTER — Ambulatory Visit: Payer: Medicare Other

## 2019-06-01 ENCOUNTER — Other Ambulatory Visit: Payer: Medicare Other

## 2019-06-02 ENCOUNTER — Other Ambulatory Visit: Payer: Self-pay

## 2019-06-02 ENCOUNTER — Ambulatory Visit
Admission: RE | Admit: 2019-06-02 | Discharge: 2019-06-02 | Disposition: A | Discharge status: Home / Self Care | Payer: Medicare PPO | Source: Ambulatory Visit | Attending: Physical Medicine and Rehabilitation | Admitting: Physical Medicine and Rehabilitation

## 2019-06-02 DIAGNOSIS — M5412 Radiculopathy, cervical region: Secondary | ICD-10-CM

## 2019-06-14 DIAGNOSIS — E1169 Type 2 diabetes mellitus with other specified complication: Secondary | ICD-10-CM | POA: Insufficient documentation

## 2019-07-02 ENCOUNTER — Ambulatory Visit: Payer: Medicare PPO | Attending: Internal Medicine

## 2019-07-02 DIAGNOSIS — Z23 Encounter for immunization: Secondary | ICD-10-CM | POA: Insufficient documentation

## 2019-07-02 NOTE — Progress Notes (Signed)
   Covid-19 Vaccination Clinic  Name:  Judith [name]    MRN: [id number] DOB: Nov 09, 1950  07/02/2019  Judith [name] was observed post Covid-19 immunization for 15 minutes without incident. She was provided with Vaccine Information Sheet and instruction to access the V-Safe system.   Judith [name] was instructed to call 911 with any severe reactions post vaccine: Marland Kitchen Difficulty breathing  . Swelling of face and throat  . A fast heartbeat  . A bad rash all over body  . Dizziness and weakness   Immunizations Administered    Name Date Dose VIS Date Route   Pfizer COVID-19 Vaccine 07/02/2019  1:06 PM 0.3 mL 04/07/2019 Intramuscular   Manufacturer: [employer], [employer]   Lot: [id number]   NDC: [address]-9

## 2019-07-25 ENCOUNTER — Ambulatory Visit: Payer: Medicare PPO | Attending: Internal Medicine

## 2019-07-25 DIAGNOSIS — Z23 Encounter for immunization: Secondary | ICD-10-CM

## 2019-07-25 NOTE — Progress Notes (Signed)
   Covid-19 Vaccination Clinic  Name:  Judith [name]    MRN: [id number] DOB: 1950/06/05  07/25/2019  [name] was observed post Covid-19 immunization for 15 minutes without incident. She was provided with Vaccine Information Sheet and instruction to access the V-Safe system.   [name] was instructed to call 911 with any severe reactions post vaccine: Marland Kitchen Difficulty breathing  . Swelling of face and throat  . A fast heartbeat  . A bad rash all over body  . Dizziness and weakness   Immunizations Administered    Name Date Dose VIS Date Route   Pfizer COVID-19 Vaccine 07/25/2019 11:45 AM 0.3 mL 04/07/2019 Intramuscular   Manufacturer: [employer], [employer]   Lot: [id number]   NDC: [address]-8

## 2019-08-14 DIAGNOSIS — Z87898 Personal history of other specified conditions: Secondary | ICD-10-CM | POA: Insufficient documentation

## 2019-09-29 ENCOUNTER — Other Ambulatory Visit: Payer: Self-pay | Admitting: Student

## 2019-09-29 DIAGNOSIS — R140 Abdominal distension (gaseous): Secondary | ICD-10-CM

## 2019-09-29 DIAGNOSIS — R1084 Generalized abdominal pain: Secondary | ICD-10-CM

## 2019-09-29 DIAGNOSIS — R1013 Epigastric pain: Secondary | ICD-10-CM

## 2019-10-04 ENCOUNTER — Other Ambulatory Visit: Payer: Self-pay

## 2019-10-04 ENCOUNTER — Ambulatory Visit
Admission: RE | Admit: 2019-10-04 | Discharge: 2019-10-04 | Disposition: A | Discharge status: Home / Self Care | Payer: Medicare PPO | Source: Ambulatory Visit | Attending: Student | Admitting: Student

## 2019-10-04 DIAGNOSIS — R1084 Generalized abdominal pain: Secondary | ICD-10-CM | POA: Diagnosis present

## 2019-10-04 DIAGNOSIS — R140 Abdominal distension (gaseous): Secondary | ICD-10-CM | POA: Insufficient documentation

## 2019-10-04 DIAGNOSIS — R1013 Epigastric pain: Secondary | ICD-10-CM | POA: Insufficient documentation

## 2020-01-29 ENCOUNTER — Telehealth (INDEPENDENT_AMBULATORY_CARE_PROVIDER_SITE_OTHER): Payer: Medicare PPO | Admitting: Psychiatry

## 2020-01-29 ENCOUNTER — Encounter: Payer: Self-pay | Admitting: Psychiatry

## 2020-01-29 ENCOUNTER — Other Ambulatory Visit: Payer: Self-pay

## 2020-01-29 ENCOUNTER — Telehealth: Payer: Self-pay

## 2020-01-29 DIAGNOSIS — F251 Schizoaffective disorder, depressive type: Secondary | ICD-10-CM | POA: Diagnosis not present

## 2020-01-29 DIAGNOSIS — Z634 Disappearance and death of family member: Secondary | ICD-10-CM | POA: Diagnosis not present

## 2020-01-29 DIAGNOSIS — F09 Unspecified mental disorder due to known physiological condition: Secondary | ICD-10-CM | POA: Diagnosis not present

## 2020-01-29 DIAGNOSIS — J45909 Unspecified asthma, uncomplicated: Secondary | ICD-10-CM | POA: Insufficient documentation

## 2020-01-29 DIAGNOSIS — E119 Type 2 diabetes mellitus without complications: Secondary | ICD-10-CM | POA: Insufficient documentation

## 2020-01-29 DIAGNOSIS — E785 Hyperlipidemia, unspecified: Secondary | ICD-10-CM | POA: Insufficient documentation

## 2020-01-29 DIAGNOSIS — K219 Gastro-esophageal reflux disease without esophagitis: Secondary | ICD-10-CM | POA: Insufficient documentation

## 2020-01-29 DIAGNOSIS — G4730 Sleep apnea, unspecified: Secondary | ICD-10-CM | POA: Insufficient documentation

## 2020-01-29 MED ORDER — ARIPIPRAZOLE 2 MG PO TABS: 2.0000 mg | ORAL_TABLET | Freq: Every day | ORAL | 1 refills | Status: DC

## 2020-01-29 NOTE — Patient Instructions (Signed)
Aripiprazole tablets What is this medicine? ARIPIPRAZOLE (ay ri PIP ray zole) is an atypical antipsychotic. It is used to treat schizophrenia and bipolar disorder, also known as manic-depression. It is also used to treat Tourette's disorder and some symptoms of autism. This medicine may also be used in combination with antidepressants to treat major depressive disorder. This medicine may be used for other purposes; ask your health care provider or pharmacist if you have questions. COMMON BRAND NAME(S): Abilify What should I tell my health care provider before I take this medicine? They need to know if you have any of these conditions:  dehydration  dementia  diabetes  heart disease  history of stroke  low blood counts, like low white cell, platelet, or red cell counts  Parkinson's disease  seizures  suicidal thoughts, plans, or attempt; a previous suicide attempt by you or a family member  an unusual or allergic reaction to aripiprazole, other medicines, foods, dyes, or preservatives  pregnant or trying to get pregnant  breast-feeding How should I use this medicine? Take this medicine by mouth with a glass of water. Follow the directions on the prescription label. You can take this medicine with or without food. Take your doses at regular intervals. Do not take your medicine more often than directed. Do not stop taking except on the advice of your doctor or health care professional. A special MedGuide will be given to you by the pharmacist with each prescription and refill. Be sure to read this information carefully each time. Talk to your pediatrician regarding the use of this medicine in children. While this drug may be prescribed for children as young as 6 years of age for selected conditions, precautions do apply. Overdosage: If you think you have taken too much of this medicine contact a poison control center or emergency room at once. NOTE: This medicine is only for you. Do  not share this medicine with others. What if I miss a dose? If you miss a dose, take it as soon as you can. If it is almost time for your next dose, take only that dose. Do not take double or extra doses. What may interact with this medicine? Do not take this medicine with any of the following medications:  brexpiprazole  cisapride  dronedarone  metoclopramide  pimozide  thioridazine This medicine may also interact with the following medications:  alcohol  carbamazepine  certain medicines for anxiety or sleep  certain medicines for blood pressure  certain medicines for fungal infections like ketoconazole, fluconazole, posaconazole, and itraconazole  clarithromycin  dofetilide  fluoxetine  other medicines that prolong the QT interval (cause an abnormal heart rhythm)  paroxetine  quinidine  rifampin  ziprasidone This list may not describe all possible interactions. Give your health care provider a list of all the medicines, herbs, non-prescription drugs, or dietary supplements you use. Also tell them if you smoke, drink alcohol, or use illegal drugs. Some items may interact with your medicine. What should I watch for while using this medicine? Visit your health care professional for regular checks on your progress. Tell your health care professional if symptoms do not start to get better or if they get worse. Do not stop taking except on your health care professional's advice. You may develop a severe reaction. Your health care professional will tell you how much medicine to take. Patients and their families should watch out for new or worsening depression or thoughts of suicide. Also watch out for sudden changes in feelings   such as feeling anxious, agitated, panicky, irritable, hostile, aggressive, impulsive, severely restless, overly excited and hyperactive, or not being able to sleep. If this happens, especially at the beginning of antidepressant treatment or after a  change in dose, call your health care professional. You may get dizzy or drowsy. Do not drive, use machinery, or do anything that needs mental alertness until you know how this medicine affects you. Do not stand or sit up quickly, especially if you are an older patient. This reduces the risk of dizzy or fainting spells. Alcohol may interfere with the effect of this medicine. Avoid alcoholic drinks. This drug can cause problems with controlling your body temperature. It can lower the response of your body to cold temperatures. If possible, stay indoors during cold weather. If you must go outdoors, wear warm clothes. It can also lower the response of your body to heat. Do not overheat. Do not over-exercise. Stay out of the sun when possible. If you must be in the sun, wear cool clothing. Drink plenty of water. If you have trouble controlling your body temperature, call your health care provider right away. This medicine may cause dry eyes and blurred vision. If you wear contact lenses, you may feel some discomfort. Lubricating drops may help. See your eye doctor if the problem does not go away or is severe. This medicine may increase blood sugar. Ask your health care provider if changes in diet or medicines are needed if you have diabetes. There are have been reports of increased sexual urges or other strong urges such as gambling while taking this medicine. If you experience any of these while taking this medicine, you should report this to your health care professional as soon as possible. What side effects may I notice from receiving this medicine? Side effects that you should report to your doctor or health care professional as soon as possible:  allergic reactions like skin rash, itching or hives, swelling of the face, lips, or tongue  breathing problems  confusion  fast, irregular heartbeat  fever or chills, sore throat  inability to keep still  males: prolonged or painful erection  new or  increased gambling urges, sexual urges, uncontrolled spending, binge or compulsive eating, or other urges  problems with balance, talking, walking  seizures  signs and symptoms of high blood sugar such as being more thirsty or hungry or having to urinate more than normal. You may also feel very tired or have blurry vision  signs and symptoms of low blood pressure like dizziness; feeling faint or lightheaded, falls; unusually weak or tired  signs and symptoms of neuroleptic malignant syndrome like confusion; fast or irregular heartbeat; high fever; increased sweating; stiff muscles  sudden numbness or weakness of the face, arm, or leg  suicidal thoughts or other mood changes  trouble swallowing  uncontrollable movements of the arms, face, head, mouth, neck, or upper body Side effects that usually do not require medical attention (report to your doctor or health care professional if they continue or are bothersome):  constipation  headache  nausea, vomiting  trouble sleeping  weight gain This list may not describe all possible side effects. Call your doctor for medical advice about side effects. You may report side effects to FDA at 1-800-FDA-1088. Where should I keep my medicine? Keep out of the reach of children. Store at room temperature between 15 and 30 degrees C (59 and 86 degrees F). Throw away any unused medicine after the expiration date. NOTE: This sheet   is a summary. It may not cover all possible information. If you have questions about this medicine, talk to your doctor, pharmacist, or health care provider.  2020 Elsevier/Gold Standard (2019-02-07 16:17:22)  

## 2020-01-29 NOTE — Telephone Encounter (Signed)
faxed and confirmed order for vit b12 dx F09 sent to [facility]
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
## 2020-02-06 ENCOUNTER — Other Ambulatory Visit: Payer: Self-pay

## 2020-02-06 ENCOUNTER — Ambulatory Visit (INDEPENDENT_AMBULATORY_CARE_PROVIDER_SITE_OTHER): Payer: Medicare PPO | Admitting: Licensed Clinical Social Worker

## 2020-02-06 ENCOUNTER — Encounter: Payer: Self-pay | Admitting: Licensed Clinical Social Worker

## 2020-02-06 DIAGNOSIS — Z634 Disappearance and death of family member: Secondary | ICD-10-CM

## 2020-02-06 DIAGNOSIS — F251 Schizoaffective disorder, depressive type: Secondary | ICD-10-CM | POA: Diagnosis not present
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
## 2020-02-13 DIAGNOSIS — Z789 Other specified health status: Secondary | ICD-10-CM | POA: Insufficient documentation

## 2020-02-13 DIAGNOSIS — N183 Chronic kidney disease, stage 3 unspecified: Secondary | ICD-10-CM | POA: Insufficient documentation

## 2020-02-22 ENCOUNTER — Telehealth: Payer: Medicare PPO | Admitting: Child and Adolescent Psychiatry

## 2020-02-27 ENCOUNTER — Other Ambulatory Visit: Payer: Self-pay

## 2020-02-27 ENCOUNTER — Encounter: Payer: Self-pay | Admitting: Psychiatry

## 2020-02-27 ENCOUNTER — Telehealth (INDEPENDENT_AMBULATORY_CARE_PROVIDER_SITE_OTHER): Payer: Medicare PPO | Admitting: Psychiatry

## 2020-02-27 DIAGNOSIS — F251 Schizoaffective disorder, depressive type: Secondary | ICD-10-CM

## 2020-02-27 DIAGNOSIS — F09 Unspecified mental disorder due to known physiological condition: Secondary | ICD-10-CM

## 2020-02-27 DIAGNOSIS — Z634 Disappearance and death of family member: Secondary | ICD-10-CM | POA: Diagnosis not present

## 2020-02-27 NOTE — Progress Notes (Signed)
Virtual Visit via Telephone Note  I connected with Judith SaferPatricia [name] on 02/27/20 at  2:30 PM EDT by telephone and verified that I am speaking with the correct person using two identifiers.  Location Provider Location : [facility] Patient Location : Home  Participants: Patient , Provider   I discussed the limitations, risks, security and privacy concerns of performing an evaluation and management service by telephone and the availability of in person appointments. I also discussed with the patient that there may be a patient responsible charge related to this service. The patient expressed understanding and agreed to proceed.     I discussed the assessment and treatment plan with the patient. The patient was provided an opportunity to ask questions and all were answered. The patient agreed with the plan and demonstrated an understanding of the instructions.   The patient was advised to call back or seek an in-person evaluation if the symptoms worsen or if the condition fails to improve as anticipated.   BH MD OP Progress Note  02/27/2020 2:53 PM Judith [name]  MRN:  [id number]  Chief Complaint:  Chief Complaint    Follow-up     HPI: Judith [name] is a 69 year old Caucasian female, widowed, retired, lives in [place], has a history of schizoaffective disorder, bereavement, cognitive disorder, obstructive sleep apnea on CPAP, diabetes melitis, hyperlipidemia, fibromyalgia, history of surgeries was evaluated by phone today.  Patient today reports she continues to grieve the loss of her grandson who passed away recently.  She reports soon after that she had to put down her dog who was 233 years old and was struggling with medical problems.  Patient however reports she is currently coping better.  She was able to spend time with her son and her other grandson.  She reports she had a good time with them.  That definitely helped.  She reports she is tolerating the Abilify well.  She continues to  be compliant with the venlafaxine.  She denies side effects to her medications.  She reports sleep as restless currently due to her cough from the bronchitis that she is struggling with.  She is currently on Augmentin and prednisone taper.  She reports the cough is getting better and her sleep is also improving.  Patient denies any suicidality, homicidality.  She does have a history of chronic auditory hallucinations and reports it is improving.  Patient denies any other concerns today.  Visit Diagnosis:    ICD-10-CM   1. Schizoaffective disorder, depressive type (HCC)  F25.1   2. Bereavement  Z63.4   3. Cognitive disorder  F09     Past Psychiatric History: I have reviewed past psychiatric history from my progress note on 01/29/2020.  Past trials of Wellbutrin, Effexor.  Past Medical History:  Past Medical History:  Diagnosis Date   Anginal pain (HCC)    Arthritis    Asthma    Depression    Diabetes mellitus without complication (HCC)    Fibromyalgia    Gastroparesis    GERD (gastroesophageal reflux disease)    Headache    Sleep apnea     Past Surgical History:  Procedure Laterality Date   ABDOMINAL HYSTERECTOMY     BLADDER REPAIR     CATARACT EXTRACTION Left 02/08/2013   CHOLECYSTECTOMY     ESOPHAGOGASTRODUODENOSCOPY (EGD) WITH PROPOFOL N/A 04/01/2016   Procedure: ESOPHAGOGASTRODUODENOSCOPY (EGD) WITH PROPOFOL;  Surgeon: Scot Junobert T Elliott, MD;  [facility];  Service: Endoscopy;  Laterality: N/A;   rotator cuff surgery  Family Psychiatric History: I have reviewed family psychiatric history from my progress note on 01/29/2020.  Family History:  Family History  Problem Relation Age of Onset   Bipolar disorder Sister    Bipolar disorder Brother    Breast cancer Neg Hx     Social History: I have reviewed social history from my progress note on 01/29/2020. Social History   Socioeconomic History   Marital status: Widowed    Spouse  name: Not on file   Number of children: 2   Years of education: Not on file   Highest education level: Not on file  Occupational History   Not on file  Tobacco Use   Smoking status: Never Smoker   Smokeless tobacco: Never Used  Substance and Sexual Activity   Alcohol use: Yes    Comment: averages lessthan 1 drink per week   Drug use: No   Sexual activity: Never  Other Topics Concern   Not on file  Social History Narrative   Retired   International aid/development worker of Corporate investment banker Strain:    Difficulty of Paying Living Expenses: Not on file  Food Insecurity:    Worried About Programme researcher, broadcasting/film/video in the Last Year: Not on file   [employer] of Food in the Last Year: Not on file  Transportation Needs:    Lack of Transportation (Medical): Not on file   Lack of Transportation (Non-Medical): Not on file  Physical Activity:    Days of Exercise per Week: Not on file   Minutes of Exercise per Session: Not on file  Stress:    Feeling of Stress : Not on file  Social Connections:    Frequency of Communication with Friends and Family: Not on file   Frequency of Social Gatherings with Friends and Family: Not on file   Attends Religious Services: Not on file   Active Member of Clubs or Organizations: Not on file   Attends Banker Meetings: Not on file   Marital Status: Not on file    Allergies:  Allergies  Allergen Reactions   Metformin Nausea Only   Pravastatin Other (See Comments)    Muscle pain    Metabolic Disorder Labs: No results found for: HGBA1C, MPG No results found for: PROLACTIN No results found for: CHOL, TRIG, HDL, CHOLHDL, VLDL, LDLCALC No results found for: TSH  Therapeutic Level Labs: No results found for: LITHIUM No results found for: VALPROATE No components found for:  CBMZ  Current Medications: Current Outpatient Medications  Medication Sig Dispense Refill   guaiFENesin-codeine (ROBITUSSIN AC) 100-10 MG/5ML  syrup Take by mouth.     lisinopril (ZESTRIL) 2.5 MG tablet Take by mouth.     albuterol (PROVENTIL HFA;VENTOLIN HFA) 108 (90 Base) MCG/ACT inhaler Inhale 2 puffs into the lungs every 6 (six) hours as needed for wheezing or shortness of breath.     ARIPiprazole (ABILIFY) 2 MG tablet Take 1 tablet (2 mg total) by mouth daily. 30 tablet 1   B-D ULTRAFINE III SHORT PEN 31G X 8 MM MISC      cetirizine (ZYRTEC) 10 MG tablet Take 10 mg by mouth daily.     glipiZIDE (GLUCOTROL XL) 10 MG 24 hr tablet Take 10 mg by mouth daily with breakfast.     glucose blood test strip 1 each by Other route. Use as instructed     Insulin Pen Needle (PEN NEEDLES 31GX5/16") 31G X 8 MM MISC See admin instructions.  LANTUS SOLOSTAR 100 UNIT/ML Solostar Pen      metoprolol succinate (TOPROL-XL) 25 MG 24 hr tablet      Multiple Vitamin (MULTI-VITAMIN) tablet Take 1 tablet by mouth daily.     omeprazole (PRILOSEC) 40 MG capsule      pioglitazone (ACTOS) 15 MG tablet      TRULICITY 1.5 MG/0.5ML SOPN      venlafaxine XR (EFFEXOR-XR) 150 MG 24 hr capsule      venlafaxine XR (EFFEXOR-XR) 75 MG 24 hr capsule      No current facility-administered medications for this visit.     Musculoskeletal: Strength & Muscle Tone: UTA Gait & Station: UTA Patient leans: N/A  Psychiatric Specialty Exam: Review of Systems  Respiratory: Positive for cough.   Psychiatric/Behavioral: Positive for dysphoric mood and sleep disturbance (Due to cough).  All other systems reviewed and are negative.   There were no vitals taken for this visit.There is no height or weight on file to calculate BMI.  General Appearance: UTA  Eye Contact:  UTA  Speech:  Clear and Coherent  Volume:  Normal  Mood:  Depressed improving  Affect:  UTA  Thought Process:  Goal Directed and Descriptions of Associations: Intact  Orientation:  Full (Time, Place, and Person)  Thought Content: Logical   Suicidal Thoughts:  No  Homicidal Thoughts:   No  Memory:  Immediate;   Fair Recent;   Fair Remote;   Fair  Judgement:  Fair  Insight:  Fair  Psychomotor Activity:  UTA  Concentration:  Concentration: Fair and Attention Span: Fair  Recall:  [employer] of Knowledge: Fair  Language: Fair  Akathisia:  No  Handed:  Right  AIMS (if indicated): UTA  Assets:  Communication Skills Desire for Improvement Housing Social Support  ADL's:  Intact  Cognition: WNL  Sleep:  Restless due to cough   Screenings: PHQ2-9     Video Visit from [facility]/29/2020 in [facility]  PHQ-2 Total Score 5  PHQ-9 Total Score 19       Assessment and Plan: Anaisa Radi is a 69 year old Caucasian female, widowed, retired, lives in [place], has a history of depression, obstructive sleep apnea on CPAP, diabetes melitis, hypertension, hyperlipidemia was evaluated by telemedicine today.  Patient is biologically predisposed given her family history of mental health problems, her own health problems.  Patient with psychosocial stressors of recent death in her family, death of her husband few years ago, death of her pet recently.  Patient however is currently making progress and does have good social support system.  Plan as noted below.  Plan Schizoaffective disorder-improving Abilify 2 mg p.o. daily. Continue venlafaxine extended release 225 mg p.o. daily.  Bereavement-improving Patient was referred for CBT  Cognitive disorder unspecified-unstable Pending labs-vitamin B12, RPR. Patient was referred for neurology evaluation-pending.  Follow-up in clinic in 1 month or sooner if needed.  I have spent atleast 20 minutes non face to face with patient today. More than 50 % of the time was spent for preparing to see the patient ( e.g., review of test, records ), obtaining and to review and separately obtained history , ordering medications and test ,psychoeducation and supportive psychotherapy and care coordination,as well as  documenting clinical information in electronic health record. This note was generated in part or whole with voice recognition software. Voice recognition is usually quite accurate but there are transcription errors that can and very often do occur. I apologize for any typographical errors that were not  detected and corrected.      Jomarie Longs, MD 02/27/2020, 2:53 PM

## 2020-03-15 ENCOUNTER — Telehealth (INDEPENDENT_AMBULATORY_CARE_PROVIDER_SITE_OTHER): Payer: Medicare PPO | Admitting: Psychiatry

## 2020-03-15 ENCOUNTER — Other Ambulatory Visit: Payer: Self-pay

## 2020-03-15 ENCOUNTER — Encounter: Payer: Self-pay | Admitting: Psychiatry

## 2020-03-15 DIAGNOSIS — Z634 Disappearance and death of family member: Secondary | ICD-10-CM | POA: Diagnosis not present

## 2020-03-15 DIAGNOSIS — F251 Schizoaffective disorder, depressive type: Secondary | ICD-10-CM

## 2020-03-15 MED ORDER — VENLAFAXINE HCL ER 75 MG PO CP24: 75.0000 mg | ORAL_CAPSULE | Freq: Every day | ORAL | 0 refills | Status: DC

## 2020-03-15 MED ORDER — ARIPIPRAZOLE 2 MG PO TABS: 2.0000 mg | ORAL_TABLET | Freq: Every day | ORAL | 0 refills | Status: DC

## 2020-03-15 MED ORDER — VENLAFAXINE HCL ER 150 MG PO CP24: 150.0000 mg | ORAL_CAPSULE | Freq: Every day | ORAL | 0 refills | Status: DC

## 2020-03-15 NOTE — Progress Notes (Signed)
Virtual Visit via Video Note  I connected with Judith [name] on 03/15/20 at 10:40 AM EST by a video enabled telemedicine application and verified that I am speaking with the correct person using two identifiers.  Location Provider Location : [facility] Patient Location : Home  Participants: Patient , Provider   I discussed the limitations of evaluation and management by telemedicine and the availability of in person appointments. The patient expressed understanding and agreed to proceed.  I discussed the assessment and treatment plan with the patient. The patient was provided an opportunity to ask questions and all were answered. The patient agreed with the plan and demonstrated an understanding of the instructions.  The patient was advised to call back or seek an in-person evaluation if the symptoms worsen or if the condition fails to improve as anticipated.  BH MD OP Progress Note  03/15/2020 12:11 PM Judith [name]  MRN:  [id number]  Chief Complaint:  Chief Complaint    Follow-up     HPI: Judith [name] is a 69 year old Caucasian female, widowed, retired, lives in [place], has a history of schizoaffective disorder, bereavement, cognitive disorder, obstructive sleep apnea on CPAP, diabetes melitis, hyperlipidemia, fibromyalgia, history of surgeries multiple was evaluated by telemedicine today.  Patient today reports she is currently making progress with regards to her depressive symptoms.  She reports she is coping with her loss well.  She reports she is able to enjoy certain activities in her life again.  She however did go through an episode of sinusitis and had to take multiple antibiotics as well as prednisone to get better.  She is currently feeling better.  She reports sleep is good.  She does have chronic auditory hallucinations-it does not bother her.  This has been going on since the past 40 years.  Patient is compliant on her Abilify and venlafaxine.  Denies side  effects.  She had her follow-up appointment with neurology, reports cognitive problems were ruled out.  She is currently under their care for treatment of her tremors, headache.  Patient denies any suicidality, homicidality.    Visit Diagnosis:    ICD-10-CM   1. Schizoaffective disorder, depressive type (HCC)  F25.1 venlafaxine XR (EFFEXOR-XR) 75 MG 24 hr capsule    venlafaxine XR (EFFEXOR-XR) 150 MG 24 hr capsule    ARIPiprazole (ABILIFY) 2 MG tablet  2. Bereavement  Z63.4     Past Psychiatric History: I have reviewed past psychiatric history from my progress note on 01/29/2020  Past Medical History:  Past Medical History:  Diagnosis Date  . Anginal pain (HCC)   . Arthritis   . Asthma   . Depression   . Diabetes mellitus without complication (HCC)   . Fibromyalgia   . Gastroparesis   . GERD (gastroesophageal reflux disease)   . Headache   . Sleep apnea     Past Surgical History:  Procedure Laterality Date  . ABDOMINAL HYSTERECTOMY    . BLADDER REPAIR    . CATARACT EXTRACTION Left 02/08/2013  . CHOLECYSTECTOMY    . ESOPHAGOGASTRODUODENOSCOPY (EGD) WITH PROPOFOL N/A 04/01/2016   Procedure: ESOPHAGOGASTRODUODENOSCOPY (EGD) WITH PROPOFOL;  Surgeon: Scot Jun, MD;  Location: [facility] ENDOSCOPY;  Service: Endoscopy;  Laterality: N/A;  . rotator cuff surgery      Family Psychiatric History: Reviewed family psychiatric history from my progress note on 01/29/2020  Family History:  Family History  Problem Relation Age of Onset  . Bipolar disorder Sister   . Bipolar disorder Brother   . Breast cancer  Neg Hx     Social History: Reviewed social history from my progress note on 01/29/2020 Social History   Socioeconomic History  . Marital status: Widowed    Spouse name: Not on file  . Number of children: 2  . Years of education: Not on file  . Highest education level: Not on file  Occupational History  . Not on file  Tobacco Use  . Smoking status: Never Smoker  .  Smokeless tobacco: Never Used  Substance and Sexual Activity  . Alcohol use: Yes    Comment: averages lessthan 1 drink per week  . Drug use: No  . Sexual activity: Never  Other Topics Concern  . Not on file  Social History Narrative   Retired   International aid/development worker of [employer] Strain:   . Difficulty of Paying Living Expenses: Not on file  Food Insecurity:   . Worried About Programme researcher, broadcasting/film/video in the Last Year: Not on file  . Ran Out of Food in the Last Year: Not on file  Transportation Needs:   . Lack of Transportation (Medical): Not on file  . Lack of Transportation (Non-Medical): Not on file  Physical Activity:   . Days of Exercise per Week: Not on file  . Minutes of Exercise per Session: Not on file  Stress:   . Feeling of Stress : Not on file  Social Connections:   . Frequency of Communication with Friends and Family: Not on file  . Frequency of Social Gatherings with Friends and Family: Not on file  . Attends Religious Services: Not on file  . Active Member of Clubs or Organizations: Not on file  . Attends Banker Meetings: Not on file  . Marital Status: Not on file    Allergies:  Allergies  Allergen Reactions  . Metformin Nausea Only  . Pravastatin Other (See Comments)    Muscle pain    Metabolic Disorder Labs: No results found for: HGBA1C, MPG No results found for: PROLACTIN No results found for: CHOL, TRIG, HDL, CHOLHDL, VLDL, LDLCALC No results found for: TSH  Therapeutic Level Labs: No results found for: LITHIUM No results found for: VALPROATE No components found for:  CBMZ  Current Medications: Current Outpatient Medications  Medication Sig Dispense Refill  . azelastine (ASTELIN) 0.1 % nasal spray Place into the nose.    . albuterol (PROVENTIL HFA;VENTOLIN HFA) 108 (90 Base) MCG/ACT inhaler Inhale 2 puffs into the lungs every 6 (six) hours as needed for wheezing or shortness of breath.    . ARIPiprazole (ABILIFY) 2  MG tablet Take 1 tablet (2 mg total) by mouth daily. 90 tablet 0  . B-D ULTRAFINE III SHORT PEN 31G X 8 MM MISC     . cetirizine (ZYRTEC) 10 MG tablet Take 10 mg by mouth daily.    Marland Kitchen glipiZIDE (GLUCOTROL XL) 10 MG 24 hr tablet Take 10 mg by mouth daily with breakfast.    . glucose blood test strip 1 each by Other route. Use as instructed    . guaiFENesin-codeine (ROBITUSSIN AC) 100-10 MG/5ML syrup Take by mouth.    . Insulin Pen Needle (PEN NEEDLES 31GX5/16") 31G X 8 MM MISC See admin instructions.    Marland Kitchen LANTUS SOLOSTAR 100 UNIT/ML Solostar Pen     . lisinopril (ZESTRIL) 2.5 MG tablet Take by mouth.    . metoprolol succinate (TOPROL-XL) 25 MG 24 hr tablet     . Multiple Vitamin (MULTI-VITAMIN) tablet Take 1 tablet  by mouth daily.    Marland Kitchen omeprazole (PRILOSEC) 40 MG capsule     . pioglitazone (ACTOS) 15 MG tablet     . TRULICITY 1.5 MG/0.5ML SOPN     . venlafaxine XR (EFFEXOR-XR) 150 MG 24 hr capsule Take 1 capsule (150 mg total) by mouth daily with breakfast. To be combined with 75 mg daily 90 capsule 0  . venlafaxine XR (EFFEXOR-XR) 75 MG 24 hr capsule Take 1 capsule (75 mg total) by mouth daily with breakfast. To be combined with 150 mg daily 90 capsule 0   No current facility-administered medications for this visit.     Musculoskeletal: Strength & Muscle Tone: UTA Gait & Station: UTA Patient leans: N/A  Psychiatric Specialty Exam: Review of Systems  Psychiatric/Behavioral: Positive for hallucinations. Negative for agitation, behavioral problems, confusion, decreased concentration, dysphoric mood, self-injury, sleep disturbance and suicidal ideas. The patient is not nervous/anxious and is not hyperactive.   All other systems reviewed and are negative.   There were no vitals taken for this visit.There is no height or weight on file to calculate BMI.  General Appearance: Casual  Eye Contact:  Fair  Speech:  Normal Rate  Volume:  Normal  Mood:  Euthymic  Affect:  Congruent  Thought  Process:  Goal Directed and Descriptions of Associations: Intact  Orientation:  Full (Time, Place, and Person)  Thought Content: Hallucinations: Auditory chronic  Suicidal Thoughts:  No  Homicidal Thoughts:  No  Memory:  Immediate;   Fair Recent;   Fair Remote;   Fair  Judgement:  Fair  Insight:  Fair  Psychomotor Activity:  Normal  Concentration:  Concentration: Fair and Attention Span: Fair  Recall:  [employer] of Knowledge: Fair  Language: Fair  Akathisia:  No  Handed:  Right  AIMS (if indicated): UTA  Assets:  Communication Skills Desire for Improvement Housing Social Support  ADL's:  Intact  Cognition: WNL  Sleep:  Fair   Screenings: PHQ2-9     Video Visit [facility]/29/2020 in [facility]  PHQ-2 Total Score 5  PHQ-9 Total Score 19       Assessment and Plan: Annison Birchard is a 69 year old Caucasian female, widowed, retired, lives in [place], has a history of depression, obstructive sleep apnea on CPAP, diabetes melitis, hypertension, hyperlipidemia was evaluated by telemedicine today.  Patient is biologically predisposed given her family history of mental health problems, her own health issues.  Patient with psychosocial stressors of death in her family, current pandemic.  Patient however is currently making progress and will continue to benefit from medication management and psychotherapy sessions.  Plan Schizoaffective disorder-improving Abilify 2 mg p.o. daily Venlafaxine extended release 225 mg p.o. daily  Bereavement-improving Patient was referred for CBT, encouraged to continue therapy with Ms. Juanito Doom.  Cognitive disorder-patient was referred to neurology.  I have reviewed neurology notes dated 03/06/2020-patient's MMSE was 30 out of 30-cognitively she is stable.  Follow-up in clinic in 2 months or sooner if needed.  I have spent atleast 20 minutes face to face by video with patient today. More than 50 % of the time was  spent for preparing to see the patient ( e.g., review of test, records ), obtaining and to review and separately obtained history , ordering medications and test ,psychoeducation and supportive psychotherapy and care coordination,as well as documenting clinical information in electronic health record. This note was generated in part or whole with voice recognition software. Voice recognition is usually quite accurate but  there are transcription errors that can and very often do occur. I apologize for any typographical errors that were not detected and corrected.        Judith LongsSaramma Mana Haberl, MD 03/15/2020, 12:11 PM

## 2020-04-01 ENCOUNTER — Telehealth: Payer: Medicare PPO | Admitting: Psychiatry

## 2020-04-03 ENCOUNTER — Ambulatory Visit (INDEPENDENT_AMBULATORY_CARE_PROVIDER_SITE_OTHER): Payer: Medicare PPO | Admitting: Licensed Clinical Social Worker

## 2020-04-03 ENCOUNTER — Encounter: Payer: Self-pay | Admitting: Licensed Clinical Social Worker

## 2020-04-03 ENCOUNTER — Other Ambulatory Visit: Payer: Self-pay

## 2020-04-03 DIAGNOSIS — Z634 Disappearance and death of family member: Secondary | ICD-10-CM | POA: Diagnosis not present

## 2020-04-03 DIAGNOSIS — F251 Schizoaffective disorder, depressive type: Secondary | ICD-10-CM

## 2020-04-03 NOTE — Progress Notes (Signed)
Virtual Visit via Telephone Note  I connected with Judith [name] on 04/03/20 at 11:00 AM EST by telephone and verified that I am speaking with the correct person using two identifiers.  Participating Parties Patient Provider  Location: Patient: Home Provider: Home Office   I attempted to connect with patient via video session, however due to connection issues session was moved to over the phone. I discussed the limitations, risks, security and privacy concerns of performing an evaluation and management service by telephone and the availability of in person appointments. I also discussed with the patient that there may be a patient responsible charge related to this service. The patient expressed understanding and agreed to proceed.  THERAPY PROGRESS NOTE  Session Time: 30 Minutes  Participation Level: Active  Behavioral Response: AlertDepressed  Type of Therapy: Individual Therapy  Treatment Goals addressed: Coping  Interventions: CBT  Summary: Judith [name] is a 69 y.o. female who presents with depression sxs. Pt reported continuing to experience grief and loss issues and finds herself tearful and "weepy" at times. Pt reported she spent Thanksgiving with her family for a few days and things went better than expected. Pt reported she is growing closer to her other grandson s/ "he and I can joke and carry on". Pt reported the Abilify is helping with reduction in depression sxs and finds herself more outgoing and motivated to socialize. Pt reported she would like to see about joining a bowling league or [place] group at her local church. Pt reported she tried dating, but realized she is not ready for a long-term commitment and believes meeting people in groups would be more beneficial.  Suicidal/Homicidal: No  Therapist Response: Therapist met with patient for follow up after completing CCA. Therapist and patient reviewed treatment plan and goals. Pt in agreement. Therapist and  patient explored grief/loss issues and attempts to cope. Therapist provided psychoeducation on stages of grief and normalized patient feelings around her own losses. Therapist supported patient's efforts to find meaning and purpose. Pt was receptive.  Plan: Return again in 3 weeks.  Diagnosis: Axis I: Bereavement and Schizoaffective Disorder    Axis II: N/A  [name], LCSW, LCAS 04/03/2020

## 2020-04-16 DIAGNOSIS — R200 Anesthesia of skin: Secondary | ICD-10-CM | POA: Insufficient documentation

## 2020-04-16 DIAGNOSIS — R208 Other disturbances of skin sensation: Secondary | ICD-10-CM | POA: Insufficient documentation

## 2020-04-24 ENCOUNTER — Encounter: Payer: Self-pay | Admitting: Licensed Clinical Social Worker

## 2020-04-24 ENCOUNTER — Other Ambulatory Visit: Payer: Self-pay

## 2020-04-24 ENCOUNTER — Ambulatory Visit (INDEPENDENT_AMBULATORY_CARE_PROVIDER_SITE_OTHER): Payer: Medicare PPO | Admitting: Licensed Clinical Social Worker

## 2020-04-24 DIAGNOSIS — Z634 Disappearance and death of family member: Secondary | ICD-10-CM | POA: Diagnosis not present

## 2020-04-24 DIAGNOSIS — F251 Schizoaffective disorder, depressive type: Secondary | ICD-10-CM | POA: Diagnosis not present

## 2020-04-24 NOTE — Progress Notes (Signed)
Virtual Visit via Video Note  I connected with Ermalene Postin on 04/24/20 at 11:00 AM EST by a video enabled telemedicine application and verified that I am speaking with the correct person using two identifiers.  Participating Parties Patient Provider  Location: Patient: Home Provider: Home Office   I discussed the limitations of evaluation and management by telemedicine and the availability of in person appointments. The patient expressed understanding and agreed to proceed.  THERAPY PROGRESS NOTE  Session Time: 30 Minutes  Participation Level: Active  Behavioral Response: CasualAlertDepressed  Type of Therapy: Individual Therapy  Treatment Goals addressed: Coping  Interventions: CBT  Summary: Shaney Deckman is a 69 y.o. female who presents with depression sxs. Pt reported experiencing an "emotional, but a good Christmas. Everyone was on the verge of tears, missing Josh. We couldn't exactly ignore it. The last 2 weeks have not been too good for me. I can just cry at the drop of a hat. Pt reported not knowing why she is having this feeling. Pt reported experiencing lack of motivation to engage in healthier habits and maintain an active lifestyle. Pt identified supports and coping skills utilized. Pt reported "I have been sleeping better, maybe only get up once during the night"..   Suicidal/Homicidal: No  Therapist Response: Therapist met with patient for follow up. Therapist and patient explored sxs, thoughts, feelings and reactions. Therapist provided psychoeducation on habit formation and opposite action to help with lack of motivation. Pt was receptive. Therapist assigned patient homework covering reading material on concept of mini-habits and pleasant activities list.  Plan: Return again in 2 weeks.  Diagnosis: Axis I: Bereavement and Schizoaffective D/O    Axis II: N/A  [name], LCSW, LCAS 04/24/2020

## 2020-05-10 ENCOUNTER — Ambulatory Visit: Payer: Medicare PPO | Admitting: Licensed Clinical Social Worker

## 2020-05-16 ENCOUNTER — Ambulatory Visit (INDEPENDENT_AMBULATORY_CARE_PROVIDER_SITE_OTHER): Payer: Medicare PPO | Admitting: Licensed Clinical Social Worker

## 2020-05-16 ENCOUNTER — Telehealth (INDEPENDENT_AMBULATORY_CARE_PROVIDER_SITE_OTHER): Payer: Medicare PPO | Admitting: Psychiatry

## 2020-05-16 ENCOUNTER — Encounter: Payer: Self-pay | Admitting: Licensed Clinical Social Worker

## 2020-05-16 ENCOUNTER — Encounter: Payer: Self-pay | Admitting: Psychiatry

## 2020-05-16 ENCOUNTER — Other Ambulatory Visit: Payer: Self-pay

## 2020-05-16 DIAGNOSIS — Z634 Disappearance and death of family member: Secondary | ICD-10-CM | POA: Diagnosis not present

## 2020-05-16 DIAGNOSIS — F251 Schizoaffective disorder, depressive type: Secondary | ICD-10-CM | POA: Diagnosis not present

## 2020-05-16 NOTE — Progress Notes (Signed)
Virtual Visit via Video Note  I connected with Judith [name] on 05/16/20 at  4:20 PM EST by a video enabled telemedicine application and verified that I am speaking with the correct person using two identifiers.  Location Provider Location : [facility] Patient Location : Home  Participants: Patient , Provider   I discussed the limitations of evaluation and management by telemedicine and the availability of in person appointments. The patient expressed understanding and agreed to proceed.    I discussed the assessment and treatment plan with the patient. The patient was provided an opportunity to ask questions and all were answered. The patient agreed with the plan and demonstrated an understanding of the instructions.   The patient was advised to call back or seek an in-person evaluation if the symptoms worsen or if the condition fails to improve as anticipated.  BH MD OP Progress Note  05/16/2020 4:55 PM Judith [name]  MRN:  [id number]  Chief Complaint:  Chief Complaint    Follow-up     HPI: Judith [name] is a 70 year old Caucasian female, widowed, retired, lives in [place], has a history of schizoaffective disorder, bereavement, cognitive disorder, obstructive sleep apnea on CPAP, diabetes mellitus, hyperlipidemia, fibromyalgia, history of multiple surgeries was evaluated by telemedicine today.  Patient reports she is currently making progress with regards to her grief.  It has been 4 months since her grandson passed away.  He passed away in 31-Jan-2023.  She reports she is taking it one day at a time.  She reports however her roommates sister passed away this morning.  She reports she is currently trying to support her to get through this.  She is compliant with her therapy sessions and reports it is beneficial.  Patient is compliant on her medications like Abilify, venlafaxine.  Reports mood symptoms are stable.  She continues to have auditory hallucinations of hearing  conversations.  She however reports it does not distress her and it has been going on for a very long time.  Patient denies any sleep problems.  She denies any suicidality, homicidality or perceptual disturbances.  Patient reports since her grandson passed she did not monitor her diet or her blood sugar levels and hence her hemoglobin A1c has been elevated.  She however is currently back on track and has upcoming follow-up sessions with her primary care provider.  She reports her medication dosages were recently readjusted.  Patient denies any other concerns today.  Visit Diagnosis:    ICD-10-CM   1. Schizoaffective disorder, depressive type (HCC)  F25.1   2. Bereavement  Z63.4     Past Psychiatric History: I have reviewed past psychiatric history from my progress note on 01/29/2020  Past Medical History:  Past Medical History:  Diagnosis Date  . Anginal pain (HCC)   . Arthritis   . Asthma   . Depression   . Diabetes mellitus without complication (HCC)   . Fibromyalgia   . Gastroparesis   . GERD (gastroesophageal reflux disease)   . Headache   . Sleep apnea     Past Surgical History:  Procedure Laterality Date  . ABDOMINAL HYSTERECTOMY    . BLADDER REPAIR    . CATARACT EXTRACTION Left 02/08/2013  . CHOLECYSTECTOMY    . ESOPHAGOGASTRODUODENOSCOPY (EGD) WITH PROPOFOL N/A 04/01/2016   Procedure: ESOPHAGOGASTRODUODENOSCOPY (EGD) WITH PROPOFOL;  Surgeon: Scot Jun, MD;  Location: [facility] ENDOSCOPY;  Service: Endoscopy;  Laterality: N/A;  . rotator cuff surgery      Family Psychiatric History: I have reviewed  family psychiatric history from my progress note on 01/29/2020  Family History:  Family History  Problem Relation Age of Onset  . Bipolar disorder Sister   . Bipolar disorder Brother   . Breast cancer Neg Hx     Social History: I have reviewed social history from my progress note on 01/29/2020 Social History   Socioeconomic History  . Marital status: Widowed     Spouse name: Not on file  . Number of children: 2  . Years of education: Not on file  . Highest education level: Not on file  Occupational History  . Not on file  Tobacco Use  . Smoking status: Never Smoker  . Smokeless tobacco: Never Used  Substance and Sexual Activity  . Alcohol use: Yes    Comment: averages lessthan 1 drink per week  . Drug use: No  . Sexual activity: Never  Other Topics Concern  . 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 Metformin Nausea Only  . Pravastatin Other (See Comments)    Muscle pain    Metabolic Disorder Labs: No results found for: HGBA1C, MPG No results found for: PROLACTIN No results found for: CHOL, TRIG, HDL, CHOLHDL, VLDL, LDLCALC No results found for: TSH  Therapeutic Level Labs: No results found for: LITHIUM No results found for: VALPROATE No components found for:  CBMZ  Current Medications: Current Outpatient Medications  Medication Sig Dispense Refill  . albuterol (PROVENTIL HFA;VENTOLIN HFA) 108 (90 Base) MCG/ACT inhaler Inhale 2 puffs into the lungs every 6 (six) hours as needed for wheezing or shortness of breath.    . ARIPiprazole (ABILIFY) 2 MG tablet Take 1 tablet (2 mg total) by mouth daily. 90 tablet 0  . azelastine (ASTELIN) 0.1 % nasal spray Place into the nose.    . B-D ULTRAFINE III SHORT PEN 31G X 8 MM MISC     . cetirizine (ZYRTEC) 10 MG tablet Take 10 mg by mouth daily.    Marland Kitchen glipiZIDE (GLUCOTROL XL) 10 MG 24 hr tablet Take 10 mg by mouth daily with breakfast.    . glucose blood test strip 1 each by Other route. Use as instructed    . guaiFENesin-codeine (ROBITUSSIN AC) 100-10 MG/5ML syrup Take by mouth.    . Insulin Pen Needle  (PEN NEEDLES 31GX5/16") 31G X 8 MM MISC See admin instructions.    Marland Kitchen LANTUS SOLOSTAR 100 UNIT/ML Solostar Pen     . lisinopril (ZESTRIL) 2.5 MG tablet Take by mouth.    . metoprolol succinate (TOPROL-XL) 25 MG 24 hr tablet     . Multiple Vitamin (MULTI-VITAMIN) tablet Take 1 tablet by mouth daily.    Marland Kitchen omeprazole (PRILOSEC) 40 MG capsule     . pioglitazone (ACTOS) 15 MG tablet     . TRULICITY 1.5 MG/0.5ML SOPN     . venlafaxine XR (EFFEXOR-XR) 150 MG 24 hr capsule Take 1 capsule (150 mg total) by mouth daily with breakfast. To be combined with 75 mg daily 90 capsule 0  . venlafaxine XR (EFFEXOR-XR) 75 MG 24 hr capsule Take 1 capsule (75 mg total) by mouth daily with breakfast. To be combined with 150 mg daily 90 capsule  0   No current facility-administered medications for this visit.     Musculoskeletal: Strength & Muscle Tone: UTA Gait & Station: UTA Patient leans: N/A  Psychiatric Specialty Exam: Review of Systems  Psychiatric/Behavioral: Positive for hallucinations. Negative for agitation, behavioral problems, confusion, decreased concentration, dysphoric mood, self-injury, sleep disturbance and suicidal ideas. The patient is not nervous/anxious and is not hyperactive.   All other systems reviewed and are negative.   There were no vitals taken for this visit.There is no height or weight on file to calculate BMI.  General Appearance: Casual  Eye Contact:  Fair  Speech:  Clear and Coherent  Volume:  Normal  Mood:  Euthymic  Affect:  Congruent  Thought Process:  Goal Directed and Descriptions of Associations: Intact  Orientation:  Full (Time, Place, and Person)  Thought Content: Hallucinations: Auditory  Chronic - conversations  Suicidal Thoughts:  No  Homicidal Thoughts:  No  Memory:  Immediate;   Fair Recent;   Fair Remote;   Fair  Judgement:  Fair  Insight:  Fair  Psychomotor Activity:  Normal  Concentration:  Concentration: Fair and Attention Span: Fair  Recall:  [employer] of Knowledge: Fair  Language: Fair  Akathisia:  No  Handed:  Right  AIMS (if indicated):UTA  Assets:  Communication Skills Desire for Improvement Housing Social Support  ADL's:  Intact  Cognition: WNL  Sleep:  Fair   Screenings: PHQ2-9   Flowsheet Row Video Visit from [facility]/29/2020 [facility]  PHQ-2 Total Score 5  PHQ-9 Total Score 19       Assessment and Plan: Judith [name] is a 70 year old Caucasian female, widowed, retired, lives in [place], has a history of depression, obstructive sleep apnea on CPAP, diabetes mellitus, hypertension, hyperlipidemia was evaluated by telemedicine today.  She is biologically predisposed given her family history of mental health problems, her own health issues.  Patient with psychosocial stressors of death in her family, current pandemic.  Patient however is currently making progress.  Plan as noted below.  Plan Schizoaffective disorder-stable Abilify 2 mg p.o. daily Venlafaxine extended release 225 mg p.o. daily  Bereavement-improving Continue CBT with Ms. Juanito Doom  I have reviewed hemoglobin A1c dated 03/28/2020-elevated at 8.4.  Patient advised to manage her diet and monitor her hemoglobin A1c.  Discussed with her that Abilify may need to be changed or replaced if she continues to have problems with her blood sugar level.  Follow-up in clinic in 2 to 3 months or sooner if needed.   I have spent atleast 20 minutes face to face by video with patient today. More than 50 % of the time was spent for preparing to see the patient ( e.g., review of test, records ),ordering medications and test ,psychoeducation and supportive psychotherapy and care coordination,as well as documenting clinical information in electronic health record,interpreting and communication of test results. This note was generated in part or whole with voice recognition software. Voice recognition is usually quite accurate but there  are transcription errors that can and very often do occur. I apologize for any typographical errors that were not detected and corrected.        Jomarie Longs, MD 05/16/2020, 4:55 PM

## 2020-05-16 NOTE — Progress Notes (Signed)
Virtual Visit via Video Note  I connected with Judith [name] on 05/16/20 at  1:00 PM EST by a video enabled telemedicine application and verified that I am speaking with the correct person using two identifiers.  Participating Parties Patient Provider  Location: Patient: Home Provider: Home Office   I discussed the limitations of evaluation and management by telemedicine and the availability of in person appointments. The patient expressed understanding and agreed to proceed.  THERAPY PROGRESS NOTE  Session Time: 12 Minutes  Participation Level: Active  Behavioral Response: CasualAlertDepressed and Tearful  Type of Therapy: Individual Therapy  Treatment Goals addressed: Coping  Interventions: CBT  Summary: Judith [name] is a 70 y.o. female who presents with depression sxs. Pt reported "I am doing fair. My friend's sister died today. I know it is going to be a hard few weeks, but we will get through it". Pt reported one thing she is looking forward to next month is going back to work for 20 hours a week. 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 Pt reported she has read a couple chapters of the mini-habits book discussed last session and so far "I am not sure I understand the concept. How do I really implement it?" Pt identified small and reasonable goals she would like to complete in the next month including getting dressed out of pajamas and making the bed daily. Pt came up with ways to shape her behavior and will review next session. Pt reported that her friend's loss reminded her of grief/loss she is still experiencing over her own family members and became tearful. Pt acknowledged about relationship with deceased spouse "I made him the center of my world. Now that he is gone it's like what do I do now. I have no sense of purpose". Pt identified having job, friends to care for and pets are what  keeps her going and is hopeful about her journey towards healing.  Suicidal/Homicidal: No  Therapist Response: Therapist met with patient for follow up. Therapist and patient reviewed homework assignment. Therapist assisted patient in coming up with mini-habits of her own to alleviate/reduce depression sxs. Therapist and patient explored thoughts, feelings reactions related to grief and loss as well as ways of finding purpose and meaning. Pt was receptive.  Plan: Return again in 2 weeks.  Diagnosis: Axis I: Bereavement and Schizoaffective Disorder    Axis II: N/A  [name], LCSW, LCAS 05/16/2020

## 2020-05-30 ENCOUNTER — Encounter: Payer: Self-pay | Admitting: Licensed Clinical Social Worker

## 2020-05-30 ENCOUNTER — Other Ambulatory Visit: Payer: Self-pay

## 2020-05-30 ENCOUNTER — Ambulatory Visit (INDEPENDENT_AMBULATORY_CARE_PROVIDER_SITE_OTHER): Payer: Medicare PPO | Admitting: Licensed Clinical Social Worker

## 2020-05-30 DIAGNOSIS — F251 Schizoaffective disorder, depressive type: Secondary | ICD-10-CM

## 2020-05-30 DIAGNOSIS — Z634 Disappearance and death of family member: Secondary | ICD-10-CM | POA: Diagnosis not present

## 2020-05-30 NOTE — Progress Notes (Signed)
Virtual Visit via Video Note  I connected with Judith [name] on 05/30/20 at  3:00 PM EST by a video enabled telemedicine application and verified that I am speaking with the correct person using two identifiers.  Participating Parties Patient Provider  Location: Patient: Home Provider: Home Office   I discussed the limitations of evaluation and management by telemedicine and the availability of in person appointments. The patient expressed understanding and agreed to proceed.  THERAPY PROGRESS NOTE  Session Time: 30 Minutes  Participation Level: Active  Behavioral Response: CasualAlertDepressed  Type of Therapy: Individual Therapy  Treatment Goals addressed: Coping  Interventions: CBT  Summary: Judith [name] is a 70 y.o. female who presents with depression sxs. Pt reported "doing okay" since last session. Pt reported she has so far been able to meet goal of dressing out of her pajamas daily, however making the bed about every other day or so. Pt reported she has finalized much of the paperwork process to initiate her new part-time job. Pt reported she is looking forward to the opportunity it will provide to socialize with a long-time friend and help her with engaging in routines. Pt reported no issues with sleep and no current concerns/issues with medication regimen. Pt reported having vivid dreams about loved ones who have passed away, however denies these dreams as distressing. Pt reported she is able to leave the house to run errands, but mostly stays home and is not very physically active due to lack of motivation and knee pain. Pt reported overall some improvement in mood. Pt identified numerical goals for treatment plan update.  Suicidal/Homicidal: No  Therapist Response: Therapist met with patient for follow up. Therapist and patient reviewed progress towards treatment goals and continued barriers for 3 month treatment plan update. Therapist assisted patient in coming up  with measurable goals to monitor progress. Pt was receptive.   Plan: Return again in 1 month.  Diagnosis: Axis I: Bereavement and Schizoaffective Disorder    Axis II: N/A  [name], LCSW, LCAS 05/30/2020

## 2020-06-27 ENCOUNTER — Encounter: Payer: Self-pay | Admitting: Licensed Clinical Social Worker

## 2020-06-27 ENCOUNTER — Ambulatory Visit (INDEPENDENT_AMBULATORY_CARE_PROVIDER_SITE_OTHER): Payer: Medicare PPO | Admitting: Licensed Clinical Social Worker

## 2020-06-27 ENCOUNTER — Other Ambulatory Visit: Payer: Self-pay

## 2020-06-27 DIAGNOSIS — F251 Schizoaffective disorder, depressive type: Secondary | ICD-10-CM

## 2020-06-27 NOTE — Progress Notes (Signed)
Virtual Visit via Video Note  I connected with Judith [name] on 06/27/20 at  4:00 PM EST by a video enabled telemedicine application and verified that I am speaking with the correct person using two identifiers.  Participating Parties Patient Provider  Location: Patient: Home Provider: Home Office   I discussed the limitations of evaluation and management by telemedicine and the availability of in person appointments. The patient expressed understanding and agreed to proceed.  THERAPY PROGRESS NOTE  Session Time: 30 Minutes  Participation Level: Active  Behavioral Response: Well GroomedAlertEuthymic  Type of Therapy: Individual Therapy  Treatment Goals addressed: Coping  Interventions: CBT  Summary: Judith [name] is a 69 y.o. female who presents with presents with minimal depression sxs. Pt reported she started her part-time job last week and s/ "it is going good. I enjoy what I do and get to be in same office with Amy. We don't get a lot of chances to talk, but at least we are together". Pt reported experiencing overall improvement in mood most days, s/ "I am feeling more upbeat" and attributes this to "having a routine and purpose". Pt reported she continues to make her bed daily. Pt completed crisis plan for low risk on C-SRSS due to hx of overdosing in the 1970s.   Suicidal/Homicidal: No  Therapist Response: Therapist met with patient for follow up. Therapist and patient reviewed PHQ2-9, C-SRSS, crisis plan, nutrition, and pain assessments. Therapist and patient explored improvement in mood and patient implementation of tx goals.  Plan: Return again in 3 weeks.  Diagnosis: Axis I: Schizoaffective Disorder    Axis II: N/A  [name], LCSW, LCAS 06/27/2020

## 2020-07-02 ENCOUNTER — Other Ambulatory Visit: Payer: Self-pay | Admitting: Psychiatry

## 2020-07-02 DIAGNOSIS — F251 Schizoaffective disorder, depressive type: Secondary | ICD-10-CM

## 2020-07-12 ENCOUNTER — Other Ambulatory Visit: Payer: Self-pay | Admitting: Family Medicine

## 2020-07-12 DIAGNOSIS — Z1231 Encounter for screening mammogram for malignant neoplasm of breast: Secondary | ICD-10-CM

## 2020-07-17 ENCOUNTER — Other Ambulatory Visit: Payer: Self-pay

## 2020-07-17 ENCOUNTER — Encounter: Payer: Self-pay | Admitting: Licensed Clinical Social Worker

## 2020-07-17 ENCOUNTER — Ambulatory Visit (INDEPENDENT_AMBULATORY_CARE_PROVIDER_SITE_OTHER): Payer: Medicare PPO | Admitting: Licensed Clinical Social Worker

## 2020-07-17 DIAGNOSIS — F251 Schizoaffective disorder, depressive type: Secondary | ICD-10-CM | POA: Diagnosis not present

## 2020-07-17 NOTE — Progress Notes (Signed)
Virtual Visit via Video Note  I connected with Judith [name] on 07/17/20 at  4:00 PM EDT by a video enabled telemedicine application and verified that I am speaking with the correct person using two identifiers.  Participating Parties Patient Provider  Location: Patient: Home Provider: Home Office   I discussed the limitations of evaluation and management by telemedicine and the availability of in person appointments. The patient expressed understanding and agreed to proceed.  THERAPY PROGRESS NOTE  Session Time: 30 Minutes  Participation Level: Active  Behavioral Response: Well GroomedAlertEuthymic  Type of Therapy: Individual Therapy  Treatment Goals addressed: Coping  Interventions: CBT  Summary: Judith [name] is a 70 y.o. female who presents with minimal depression sxs. Pt reported "sleep is okay, except for having to get up several times to use the restroom". Pt identified overall "mood is okay" and continues to enjoy working part-time. Pt reported it has been an "adjustment" and experiencing some body pain in hands from typing all day. Pt reported she usually likes to sit and rest after coming back home from work and is "starting to get back into crocheting". Pt reported some concerns about potential medication side effect from taking Gabapentin after reading pamphlet that it can cause lupus. Pt reported she already experiences "muscle pain and spasms". Pt reported she takes 13 pills per day of all her combined medications. Pt reported since taking antidepressants and adding Abilify "things have improved".  Suicidal/Homicidal: No  Therapist Response: Therapist met with patient for follow up. Therapist and patient discussed sxs, medication compliance, and coping with pain. Therapist informed patient regarding initial phase of terminating services with this therapist due to leaving the practice in the next 5 weeks. Pt denied any concerns around this and had no questions at  this time.  Plan: Return again in 3 weeks.  Diagnosis: Axis I: Schizoaffective Disorder    Axis II: N/A  [name], LCSW, LCAS 07/17/2020

## 2020-07-25 ENCOUNTER — Encounter: Payer: Self-pay | Admitting: Psychiatry

## 2020-07-25 ENCOUNTER — Telehealth (INDEPENDENT_AMBULATORY_CARE_PROVIDER_SITE_OTHER): Payer: Medicare PPO | Admitting: Psychiatry

## 2020-07-25 ENCOUNTER — Other Ambulatory Visit: Payer: Self-pay

## 2020-07-25 DIAGNOSIS — Z634 Disappearance and death of family member: Secondary | ICD-10-CM | POA: Diagnosis not present

## 2020-07-25 DIAGNOSIS — F251 Schizoaffective disorder, depressive type: Secondary | ICD-10-CM

## 2020-07-25 MED ORDER — VENLAFAXINE HCL ER 150 MG PO CP24: 150.0000 mg | ORAL_CAPSULE | Freq: Every day | ORAL | 0 refills | Status: DC

## 2020-07-25 MED ORDER — VENLAFAXINE HCL ER 75 MG PO CP24: 75.0000 mg | ORAL_CAPSULE | Freq: Every day | ORAL | 0 refills | Status: DC

## 2020-07-25 NOTE — Progress Notes (Signed)
Virtual Visit via Video Note  I connected with Judith [name] on 07/25/20 at  4:00 PM EDT by a video enabled telemedicine application and verified that I am speaking with the correct person using two identifiers.  Location Provider Location : [facility] Patient Location : Home  Participants: Patient , Provider   I discussed the limitations of evaluation and management by telemedicine and the availability of in person appointments. The patient expressed understanding and agreed to proceed.     I discussed the assessment and treatment plan with the patient. The patient was provided an opportunity to ask questions and all were answered. The patient agreed with the plan and demonstrated an understanding of the instructions.   The patient was advised to call back or seek an in-person evaluation if the symptoms worsen or if the condition fails to improve as anticipated.   BH MD OP Progress Note  07/25/2020 7:39 PM Judith [name]  MRN:  [id number]  Chief Complaint:  Chief Complaint    Follow-up; Hallucinations; Depression     HPI: Judith [name] is a 70 year old Caucasian female, widowed, retired, lives in [place], has a history of schizoaffective disorder, bereavement, cognitive disorder, obstructive sleep apnea on CPAP, diabetes melitis, hyperlipidemia, fibromyalgia, history of multiple surgeries was evaluated by telemedicine today.  Patient today reports she is currently working part-time at [employer].  She reports she likes working there since she is working there with a friend with whom she has worked in the past.  She also reports it also keeps her occupied during the day and is a good distraction for her.  She reports she is coping with her grief better than before.  She reports her auditory hallucinations are fainter, she does not hear them often or does not pay attention.  Patient denies any suicidality, homicidality or perceptual  disturbances.  Patient denies any concerns with her medication regimen and reports she is tolerating it well.  Patient reports her hemoglobin A1c has come down and she is happy about that.  She has been watching her diet.  Patient denies any other concerns today.  Visit Diagnosis:    ICD-10-CM   1. Schizoaffective disorder, depressive type (HCC)  F25.1 venlafaxine XR (EFFEXOR-XR) 150 MG 24 hr capsule    venlafaxine XR (EFFEXOR-XR) 75 MG 24 hr capsule  2. Bereavement  Z63.4     Past Psychiatric History: I have reviewed past psychiatric history from my progress note on 01/29/2020  Past Medical History:  Past Medical History:  Diagnosis Date  . Anginal pain (HCC)   . Arthritis   . Asthma   . Depression   . Diabetes mellitus without complication (HCC)   . Fibromyalgia   . Gastroparesis   . GERD (gastroesophageal reflux disease)   . Headache   . Sleep apnea     Past Surgical History:  Procedure Laterality Date  . ABDOMINAL HYSTERECTOMY    . BLADDER REPAIR    . CATARACT EXTRACTION Left 02/08/2013  . CHOLECYSTECTOMY    . ESOPHAGOGASTRODUODENOSCOPY (EGD) WITH PROPOFOL N/A 04/01/2016   Procedure: ESOPHAGOGASTRODUODENOSCOPY (EGD) WITH PROPOFOL;  Surgeon: Scot Jun, MD;  Location: [facility] ENDOSCOPY;  Service: Endoscopy;  Laterality: N/A;  . rotator cuff surgery      Family Psychiatric History: I have reviewed family psychiatric history from my progress note on 01/29/2020  Family History:  Family History  Problem Relation Age of Onset  . Bipolar disorder Sister   . Bipolar disorder Brother   . Breast cancer  Neg Hx     Social History: Reviewed social history from my progress note on 01/29/2020 Social History   Socioeconomic History  . Marital status: Widowed    Spouse name: Not on file  . Number of children: 2  . Years of education: Not on file  . Highest education level: Not on file  Occupational History  . Not on file  Tobacco Use  . Smoking status: Never Smoker   . Smokeless tobacco: Never Used  Substance and Sexual Activity  . Alcohol use: Yes    Comment: averages lessthan 1 drink per week  . Drug use: No  . Sexual activity: Never  Other Topics Concern  . 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 Metformin Nausea Only  . Pravastatin Other (See Comments)    Muscle pain  . Topiramate Other (See Comments)    Metabolic Disorder Labs: No results found for: HGBA1C, MPG No results found for: PROLACTIN No results found for: CHOL, TRIG, HDL, CHOLHDL, VLDL, LDLCALC No results found for: TSH  Therapeutic Level Labs: No results found for: LITHIUM No results found for: VALPROATE No components found for:  CBMZ  Current Medications: Current Outpatient Medications  Medication Sig Dispense Refill  . gabapentin (NEURONTIN) 100 MG capsule Take 100mg  gabapentin twice a day for one week, then increase to 200mg  twice a day.    Galcanezumab-gnlm (EMGALITY) 120 MG/ML SOAJ Inject into the skin.    albuterol (PROVENTIL HFA;VENTOLIN HFA) 108 (90 Base) MCG/ACT inhaler Inhale 2 puffs into the lungs every 6 (six) hours as needed for wheezing or shortness of breath.    . ARIPiprazole (ABILIFY) 2 MG tablet TAKE 1 TABLET(2 MG) BY MOUTH DAILY 90 tablet 0  . azelastine (ASTELIN) 0.1 % nasal spray Place into the nose.    . B-D ULTRAFINE III SHORT PEN 31G X 8 MM MISC     . cetirizine (ZYRTEC) 10 MG tablet Take 10 mg by mouth daily.    Marland Kitchen glipiZIDE (GLUCOTROL XL) 10 MG 24 hr tablet Take 10 mg by mouth daily with breakfast.    . glucose blood test strip 1 each by Other route. Use as instructed    . guaiFENesin-codeine (ROBITUSSIN AC) 100-10 MG/5ML syrup Take by mouth.    Marland Kitchen LANTUS  SOLOSTAR 100 UNIT/ML Solostar Pen     . lisinopril (ZESTRIL) 2.5 MG tablet Take by mouth.    . metoprolol succinate (TOPROL-XL) 25 MG 24 hr tablet     . Multiple Vitamin (MULTI-VITAMIN) tablet Take 1 tablet by mouth daily.    Marland Kitchen omeprazole (PRILOSEC) 40 MG capsule     . pioglitazone (ACTOS) 15 MG tablet     . TRULICITY 1.5 MG/0.5ML SOPN     . venlafaxine XR (EFFEXOR-XR) 150 MG 24 hr capsule Take 1 capsule (150 mg total) by mouth daily with breakfast. To be combined with 75 mg daily 90 capsule 0  . venlafaxine XR (EFFEXOR-XR) 75 MG 24 hr capsule Take 1 capsule (75 mg total) by mouth daily with breakfast. To be combined with 150 mg daily 90 capsule 0   No current facility-administered medications for this visit.     Musculoskeletal:  Strength & Muscle Tone: UTA Gait & Station: UTA Patient leans: N/A  Psychiatric Specialty Exam: Review of Systems  Psychiatric/Behavioral: Positive for hallucinations. Negative for agitation, behavioral problems, confusion, decreased concentration, dysphoric mood, self-injury and sleep disturbance. The patient is not nervous/anxious and is not hyperactive.   All other systems reviewed and are negative.   There were no vitals taken for this visit.There is no height or weight on file to calculate BMI.  General Appearance: Casual  Eye Contact:  Fair  Speech:  Clear and Coherent  Volume:  Normal  Mood:  Euthymic  Affect:  Congruent  Thought Process:  Goal Directed and Descriptions of Associations: Intact  Orientation:  Full (Time, Place, and Person)  Thought Content: Hallucinations: Auditory Fainter - not paying attention  Suicidal Thoughts:  No  Homicidal Thoughts:  No  Memory:  Immediate;   Fair Recent;   Fair Remote;   Fair  Judgement:  Fair  Insight:  Fair  Psychomotor Activity:  Normal  Concentration:  Concentration: Fair and Attention Span: Fair  Recall:  [employer] of Knowledge: Fair  Language: Fair  Akathisia:  No  Handed:  Right  AIMS (if  indicated): UTA  Assets:  Communication Skills Desire for Improvement Housing Social Support  ADL's:  Intact  Cognition: WNL  Sleep:  Fair   Screenings: Administrator, sports from 06/27/2020 in [facility] Video Visit from 01/29/2020 in [facility]  PHQ-2 Total Score 0 5  PHQ-9 Total Score -- 19    Flowsheet Row Counselor from 06/27/2020 in [facility]  C-SSRS RISK CATEGORY Low Risk       Assessment and Plan: Jamirah Zelaya is a 70 year old Caucasian female, widowed, retired, lives in [place], has a history of depression, schizoaffective disorder, obstructive sleep apnea on CPAP, diabetes melitis, hypertension, hyperlipidemia was evaluated by telemedicine today.  Patient is biologically predisposed given her family history of mental health problems, her own health issues.  Patient with psychosocial stressors of the current pandemic, death in the family.  Patient however is currently making progress.  Plan as noted below.  Plan Schizoaffective disorder-stable Abilify 2 mg p.o. daily Venlafaxine extended release 225 mg p.o. daily  Bereavement-improving Continue CBT with Ms. Juanito Doom  I have reviewed and discussed hemoglobin A1c dated 07/04/2020-reduced to 6.8.  Patient encouraged to continue to watch her diet and exercise.  Follow-up in clinic in 3 months in person.  This note was generated in part or whole with voice recognition software. Voice recognition is usually quite accurate but there are transcription errors that can and very often do occur. I apologize for any typographical errors that were not detected and corrected.        Judith Longs, MD 07/25/2020, 7:39 PM

## 2020-08-06 ENCOUNTER — Encounter: Payer: Self-pay | Admitting: Licensed Clinical Social Worker

## 2020-08-06 ENCOUNTER — Other Ambulatory Visit: Payer: Self-pay

## 2020-08-06 ENCOUNTER — Ambulatory Visit (INDEPENDENT_AMBULATORY_CARE_PROVIDER_SITE_OTHER): Payer: Medicare PPO | Admitting: Licensed Clinical Social Worker

## 2020-08-06 DIAGNOSIS — F251 Schizoaffective disorder, depressive type: Secondary | ICD-10-CM

## 2020-08-06 NOTE — Progress Notes (Signed)
Virtual Visit via Video Note  I connected with Ermalene Postin on 08/06/20 at  4:00 PM EDT by a video enabled telemedicine application and verified that I am speaking with the correct person using two identifiers.  Participating Parties Patient Provider  Location: Patient: Home Provider: Home Office   I discussed the limitations of evaluation and management by telemedicine and the availability of in person appointments. The patient expressed understanding and agreed to proceed.  THERAPY PROGRESS NOTE  Session Time: 30 Minutes  Participation Level: Active  Behavioral Response: Well GroomedAlertDepressed  Type of Therapy: Individual Therapy  Treatment Goals addressed: Coping  Interventions: CBT  Summary: Judith [name] is a 70 y.o. female who presents with minimal depression sxs. Pt reported "sometimes feeling down and going over old hurts and old worries. But I just keep pushing. I tell myself those are old things, can't do nothing about it and to let it go". Pt identified triggers for ruminations and copes by "I try to keep my mind busy". Pt reported she recently found out her sister was diagnosed with Stage 3 ovarian cancer and is trying to be supportive, however she does not like to be around her often due to "negative attitude". Pt reported she lost her brother and niece to leukemia as well.    Suicidal/Homicidal: No  Therapist Response: Therapist met with patient for follow up. Therapist and patient discussed current stressors and attempts to cope. Therapist and patient explored triggers and family dynamics.  Plan: Return again as needed to resume therapy with new therapist.  Diagnosis: Axis I: Schizoaffective Disorder    Axis II: N/A  [name], LCSW, LCAS 08/06/2020

## 2020-08-15 ENCOUNTER — Other Ambulatory Visit: Payer: Self-pay

## 2020-08-15 ENCOUNTER — Ambulatory Visit
Admission: RE | Admit: 2020-08-15 | Discharge: 2020-08-15 | Disposition: A | Discharge status: Home / Self Care | Payer: Medicare PPO | Source: Ambulatory Visit | Attending: Family Medicine | Admitting: Family Medicine

## 2020-08-15 DIAGNOSIS — Z1231 Encounter for screening mammogram for malignant neoplasm of breast: Secondary | ICD-10-CM | POA: Insufficient documentation

## 2020-09-04 DIAGNOSIS — R2681 Unsteadiness on feet: Secondary | ICD-10-CM | POA: Insufficient documentation

## 2020-10-01 ENCOUNTER — Encounter: Payer: Self-pay | Admitting: Psychiatry

## 2020-10-01 ENCOUNTER — Other Ambulatory Visit: Payer: Self-pay

## 2020-10-01 ENCOUNTER — Telehealth (INDEPENDENT_AMBULATORY_CARE_PROVIDER_SITE_OTHER): Payer: Medicare PPO | Admitting: Psychiatry

## 2020-10-01 DIAGNOSIS — Z634 Disappearance and death of family member: Secondary | ICD-10-CM

## 2020-10-01 DIAGNOSIS — F251 Schizoaffective disorder, depressive type: Secondary | ICD-10-CM

## 2020-10-01 MED ORDER — ARIPIPRAZOLE 2 MG PO TABS: ORAL_TABLET | ORAL | 0 refills | Status: DC

## 2020-10-01 MED ORDER — VENLAFAXINE HCL ER 150 MG PO CP24: 150.0000 mg | ORAL_CAPSULE | Freq: Every day | ORAL | 0 refills | Status: DC

## 2020-10-01 MED ORDER — VENLAFAXINE HCL ER 75 MG PO CP24: 75.0000 mg | ORAL_CAPSULE | Freq: Every day | ORAL | 0 refills | Status: DC

## 2020-10-01 NOTE — Progress Notes (Signed)
Virtual Visit via Video Note  I connected with Judith [name] on 10/01/20 at  9:00 AM EDT by a video enabled telemedicine application and verified that I am speaking with the correct person using two identifiers.  Location Provider Location : Office Patient Location : Home  Participants: Patient , Provider   I discussed the limitations of evaluation and management by telemedicine and the availability of in person appointments. The patient expressed understanding and agreed to proceed.   I discussed the assessment and treatment plan with the patient. The patient was provided an opportunity to ask questions and all were answered. The patient agreed with the plan and demonstrated an understanding of the instructions.   The patient was advised to call back or seek an in-person evaluation if the symptoms worsen or if the condition fails to improve as anticipated.   BH MD OP Progress Note  10/01/2020 9:11 AM Judith [name]  MRN:  [id number]  Chief Complaint:  Chief Complaint    Follow-up; Anxiety; Hallucinations     HPI: Judith [name] is a 70 year old Caucasian female, widowed, employed part-time, lives in [place], has a history of schizoaffective disorder, bereavement, cognitive disorder, obstructive sleep apnea on CPAP, diabetes mellitus, hyperlipidemia, fibromyalgia, history of multiple surgeries was evaluated by telemedicine today.  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  She however reports her contract will end at the end of June.  And she is planning to take a break after that.  She is planning to spend some time with her sister.  She reports she had a good birthday weekend and she spent it with her sister.  She continues to have hallucinations however they are chronic, does not distress her.  Patient reports mood wise she is doing okay.  She is sleeping well.  She does wake up to urinate however she is able to fall  back asleep.  She wakes up feeling rested most days.  Patient reports she is compliant on medications like Abilify, venlafaxine.  Denies side effects.  Patient denies any suicidality, homicidality.  She does have knee pain however follows up with [facility] providers [facility].  Patient denies any other concerns today.  Visit Diagnosis:    ICD-10-CM   1. Schizoaffective disorder, depressive type (HCC)  F25.1 ARIPiprazole (ABILIFY) 2 MG tablet    venlafaxine XR (EFFEXOR-XR) 75 MG 24 hr capsule    venlafaxine XR (EFFEXOR-XR) 150 MG 24 hr capsule  2. Bereavement  Z63.4     Past Psychiatric History: I have reviewed past psychiatric history from progress note on 01/29/2020  Past Medical History:  Past Medical History:  Diagnosis Date  . Anginal pain (HCC)   . Arthritis   . Asthma   . Depression   . Diabetes mellitus without complication (HCC)   . Fibromyalgia   . Gastroparesis   . GERD (gastroesophageal reflux disease)   . Headache   . Sleep apnea     Past Surgical History:  Procedure Laterality Date  . ABDOMINAL HYSTERECTOMY    . BLADDER REPAIR    . CATARACT EXTRACTION Left 02/08/2013  . CHOLECYSTECTOMY    . ESOPHAGOGASTRODUODENOSCOPY (EGD) WITH PROPOFOL N/A 04/01/2016   Procedure: ESOPHAGOGASTRODUODENOSCOPY (EGD) WITH PROPOFOL;  Surgeon: Scot Jun, MD;  Location: [facility] ENDOSCOPY;  Service: Endoscopy;  Laterality: N/A;  . rotator cuff surgery      Family Psychiatric History: Reviewed family psychiatric history from progress note on 01/29/2020  Family History:  Family History  Problem Relation Age of Onset  . Bipolar disorder Sister   . Bipolar disorder Brother   . Breast cancer Neg Hx     Social History: Reviewed social history from progress note on 01/29/2020 Social History   Socioeconomic History  . Marital status: Widowed    Spouse name: Not on file  . Number of children: 2  . Years of education: Not on file  . Highest education level: Not on  file  Occupational History  . Not on file  Tobacco Use  . Smoking status: Never Smoker  . Smokeless tobacco: Never Used  Substance and Sexual Activity  . Alcohol use: Yes    Comment: averages lessthan 1 drink per week  . Drug use: No  . Sexual activity: Never  Other Topics Concern  . 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 Metformin Nausea Only  . Pravastatin Other (See Comments)    Muscle pain  . Topiramate Other (See Comments)    Metabolic Disorder Labs: No results found for: HGBA1C, MPG No results found for: PROLACTIN No results found for: CHOL, TRIG, HDL, CHOLHDL, VLDL, LDLCALC No results found for: TSH  Therapeutic Level Labs: No results found for: LITHIUM No results found for: VALPROATE No components found for:  CBMZ  Current Medications: Current Outpatient Medications  Medication Sig Dispense Refill  . albuterol (PROVENTIL HFA;VENTOLIN HFA) 108 (90 Base) MCG/ACT inhaler Inhale 2 puffs into the lungs every 6 (six) hours as needed for wheezing or shortness of breath.    . B-D ULTRAFINE III SHORT PEN 31G X 8 MM MISC     . cetirizine (ZYRTEC) 10 MG tablet Take 10 mg by mouth daily.    Marland Kitchen gabapentin (NEURONTIN) 100 MG capsule Take 100 mg by mouth 2 (two) times daily.    Marland Kitchen Galcanezumab-gnlm (EMGALITY) 120 MG/ML SOAJ Inject into the skin.    Marland Kitchen glipiZIDE (GLUCOTROL XL) 10 MG 24 hr tablet Take 20 mg by mouth daily with breakfast.    . glucose blood test strip 1 each by Other route. Use as instructed    . LANTUS SOLOSTAR 100 UNIT/ML Solostar Pen     . metoprolol succinate (TOPROL-XL) 25 MG 24 hr tablet     . Multiple Vitamin (MULTI-VITAMIN) tablet Take 1 tablet by mouth daily.    Marland Kitchen  omeprazole (PRILOSEC) 40 MG capsule     . TRULICITY 1.5 MG/0.5ML SOPN     . zonisamide (ZONEGRAN) 100 MG capsule Take by mouth.    . ARIPiprazole (ABILIFY) 2 MG tablet TAKE 1 TABLET(2 MG) BY MOUTH DAILY 90 tablet 0  . azelastine (ASTELIN) 0.1 % nasal spray Place into the nose. (Patient not taking: Reported on 10/01/2020)    . guaiFENesin-codeine (ROBITUSSIN AC) 100-10 MG/5ML syrup Take by mouth. (Patient not taking: Reported on 10/01/2020)    . HYDROcodone-acetaminophen (NORCO/VICODIN) 5-325 MG tablet Take one tablet at night for pain; may take up to every 6 hours as needed for pain if not working or driving (Patient not taking: Reported on 10/01/2020)    . lisinopril (ZESTRIL) 2.5 MG tablet Take by mouth. (Patient not taking: Reported on 10/01/2020)    . pioglitazone (ACTOS) 15  MG tablet  (Patient not taking: Reported on 10/01/2020)    . venlafaxine XR (EFFEXOR-XR) 150 MG 24 hr capsule Take 1 capsule (150 mg total) by mouth daily with breakfast. To be combined with 75 mg daily 90 capsule 0  . venlafaxine XR (EFFEXOR-XR) 75 MG 24 hr capsule Take 1 capsule (75 mg total) by mouth daily with breakfast. To be combined with 150 mg daily 90 capsule 0   No current facility-administered medications for this visit.     Musculoskeletal: Strength & Muscle Tone: UTA Gait & Station: UTA Patient leans: N/A  Psychiatric Specialty Exam: Review of Systems  Musculoskeletal:       Knee pain - left  Psychiatric/Behavioral: Positive for hallucinations.  All other systems reviewed and are negative.   There were no vitals taken for this visit.There is no height or weight on file to calculate BMI.  General Appearance: Casual  Eye Contact:  Fair  Speech:  Normal Rate  Volume:  Normal  Mood:  Euthymic  Affect:  Congruent  Thought Process:  Goal Directed and Descriptions of Associations: Intact  Orientation:  Full (Time, Place, and Person)  Thought Content: Hallucinations: Auditory chronic-does not bother her   Suicidal Thoughts:  No  Homicidal Thoughts:  No  Memory:  Immediate;   Fair Recent;   Fair Remote;   Fair  Judgement:  Fair  Insight:  Fair  Psychomotor Activity:  Normal  Concentration:  Concentration: Fair and Attention Span: Fair  Recall:  [employer]  Fund of Knowledge: Fair  Language: Fair  Akathisia:  No  Handed:  Right  AIMS (if indicated): UTA  Assets:  Communication Skills Desire for Improvement Housing Social Support  ADL's:  Intact  Cognition: WNL  Sleep:  Fair   Screenings: GAD-7   Flowsheet Row Video [facility]/2020 [facility]  Total GAD-7 Score 0    PHQ2-9   Flowsheet Row Video Visit [facility] 10[facility]01/2020 in [facility] Counselor from [facility]/27/2020 [facility] Video Visit from 01/29/2020 in [facility]  PHQ-2 Total Score 0 0 5  PHQ-9 Total Score -- -- 19    Flowsheet Row Video Visit [facility] 10/01/2020 in [facility] Counselor from [facility]/27/2020 in [facility]  C-SSRS RISK CATEGORY Low Risk Low Risk       Assessment and Plan: Judith Saferatricia Phetteplace is a [age] year old Caucasian female, widowed, retired, lives in [place], has a history of depression, schizoaffective disorder, obstructive sleep apnea on CPAP, diabetes melitis, hypertension, hyperlipidemia was evaluated by telemedicine today.  Patient is biologically predisposed given family history.  Patient with psychosocial stressors of the pandemic, death in her family is currently stable.  Plan as noted below.  Plan Schizoaffective disorder-stable Abilify 2 mg p.o. daily Venlafaxine extended release 225 mg p.o. daily  Bereavement-improving I have communicated with the front desk to schedule patient with our therapist Ms. Christina Hussami since her previous therapist left.  I have reviewed the following labs-dated 08/20/2020-hemoglobin A1c-elevated at 8.3,  lipid panel-within normal limits-patient to continue to follow-up with her provider for the same.  Patient to continue to follow-up with pain management for knee pain.  Follow-up in clinic in office in 3 months.  This note was generated in part or whole with voice recognition software. Voice recognition is usually quite accurate but there are transcription errors that can and very often do occur. I apologize for any typographical errors that were not detected and corrected.  Jomarie Longs, MD 10/01/2020, 9:11 AM

## 2020-10-25 DIAGNOSIS — N3941 Urge incontinence: Secondary | ICD-10-CM | POA: Insufficient documentation

## 2020-11-01 ENCOUNTER — Other Ambulatory Visit: Payer: Self-pay

## 2020-11-01 ENCOUNTER — Ambulatory Visit (INDEPENDENT_AMBULATORY_CARE_PROVIDER_SITE_OTHER): Payer: Medicare PPO | Admitting: Licensed Clinical Social Worker

## 2020-11-01 DIAGNOSIS — Z634 Disappearance and death of family member: Secondary | ICD-10-CM

## 2020-11-01 DIAGNOSIS — F251 Schizoaffective disorder, depressive type: Secondary | ICD-10-CM

## 2020-11-01 NOTE — Progress Notes (Addendum)
Virtual Visit via Video Note  I connected with Rivka Safer on 11/01/20 at 11:00 AM EDT by a video enabled telemedicine application and verified that I am speaking with the correct person using two identifiers.  Location: Patient: home Provider: remote office [place], [place])   I discussed the limitations of evaluation and management by telemedicine and the availability of in person appointments. The patient expressed understanding and agreed to proceed.  I discussed the assessment and treatment plan with the patient. The patient was provided an opportunity to ask questions and all were answered. The patient agreed with the plan and demonstrated an understanding of the instructions.   The patient was advised to call back or seek an in-person evaluation if the symptoms worsen or if the condition fails to improve as anticipated.  I provided 60 minutes of non-face-to-face time during this encounter.   Ellison Leisure R Chue Berkovich, LCSW   THERAPIST PROGRESS NOTE  Session Time: 11a-12p  Participation Level: Active  Behavioral Response: Neat and Well GroomedAlert  Type of Therapy: Individual Therapy  Treatment Goals addressed: Anxiety and Coping  Interventions: CBT and Supportive  Summary: Bodhi Stenglein is a 70 y.o. female who presents with continuing symptoms related to bereavement and schizoaffective disorder. This was initial session since patient transferred from another outpatient therapist to this practice. Allowed patient to share any growth and progress that she feels she has achieved with previous outpatient therapy. Helped patient to identify future goals and treatment plan.   Allowed patient safe space to explore and express thoughts and feelings associated with life events in recent external stressors. Discussed patient's relationship with her best friend and roommate, Pam. Patient reports that she has a lot of chronic knee pain, and has had several physicians examine it--pt is  currently receiving knee injections. Patient reports that she's not very active, and watches TV a lot throughout the day. Patient is able to move around from room to room and get household chores completed.   Patient reports that she has a puppy that wakes her up every morning and is ready for breakfast in a walk.   Patient reports that at age 40 she got married and also found out that she had extended family members after thinking that she was an only child for most of her life. Patient accidentally found out that she was adopted after finding adoption papers prior to getting married (age 52--first marriage). Patient adoptive mother was not happy about patient knowing the truth. Patient reports that adoptive mother was very controlling, and wasn't happy about her getting married, especially with 2nd husband.  Patient has two sons and several grandchildren. Patient enjoys spending quality time with her grandchildren..   Continued recommendations are as follows: self care behaviors, positive social engagements, focusing on overall work/home/life balance, and focusing on positive physical and emotional wellness.   Pt requests to explore relationship with late husband, Rosanne Ashing, at next session.   Suicidal/Homicidal: No  Therapist Response: Initial session with pt since transfer from another OPT.  Reviewed treatment goals and treatment plan.   Plan: Return again in 4 weeks.  Diagnosis: Axis I: Bereavement and Schizoaffective Disorder    Axis II: No diagnosis    Ernest Haber Akeiba Axelson, LCSW 11/01/2020

## 2020-12-11 ENCOUNTER — Other Ambulatory Visit: Payer: Self-pay

## 2020-12-11 ENCOUNTER — Ambulatory Visit (INDEPENDENT_AMBULATORY_CARE_PROVIDER_SITE_OTHER): Payer: Medicare PPO | Admitting: Licensed Clinical Social Worker

## 2020-12-11 DIAGNOSIS — F251 Schizoaffective disorder, depressive type: Secondary | ICD-10-CM | POA: Diagnosis not present

## 2020-12-11 DIAGNOSIS — Z634 Disappearance and death of family member: Secondary | ICD-10-CM | POA: Diagnosis not present

## 2020-12-11 NOTE — Progress Notes (Signed)
Virtual Visit via Video Note  I connected with Rivka Safer on 12/11/20 at  1:00 PM EDT by a video enabled telemedicine application and verified that I am speaking with the correct person using two identifiers.  Location: Patient: home Provider: [facility]   I discussed the limitations of evaluation and management by telemedicine and the availability of in person appointments. The patient expressed understanding and agreed to proceed.   I discussed the assessment and treatment plan with the patient. The patient was provided an opportunity to ask questions and all were answered. The patient agreed with the plan and demonstrated an understanding of the instructions.   The patient was advised to call back or seek an in-person evaluation if the symptoms worsen or if the condition fails to improve as anticipated.  I provided 45 minutes of non-face-to-face time during this encounter.   Jurnei Latini R Raiyah Speakman, LCSW   THERAPIST PROGRESS NOTE  Session Time: 1-1:45p  Participation Level: Active  Behavioral Response: NAAlertDepressed  Type of Therapy: Individual Therapy  Treatment Goals addressed: Anxiety  Interventions: CBT and Other: trauma focused  Summary: Alisah Grandberry is a 70 y.o. female who presents with improving symptoms related to diagnosis. Patient reports that she is having some knee pain, and that is one of the biggest contributing factors to stress currently. Patient reports that she feels as she is managing it well. Discussed several other health related concerns, and patient shared thoughts and feelings associated with them. Patient reports good quality and quantity of sleep. Allowed patient to identify coping mechanisms that have been helpful in the past, and encourage continued use..   Suicidal/Homicidal: No  Therapist Response: Patient is reporting that she is engaging in physical and recreational activities that reflect increased energy and interest. Patient is able to  identify and utilize behavioral strategies to overcome depression symptoms. Patient is able to describe current and past experiences with depression symptoms and able to identify how these symptoms impair overall functioning. Patient can also identify attempts to resolve symptoms. These behaviors reflect personal growth and steps towards progress. Treatment to continue as indicated.  Plan: Return again in 4 weeks.  Diagnosis: Axis I: Schizoaffective Disorder    Axis II: No diagnosis    Ernest Haber Gavrielle Streck, LCSW 12/11/2020

## 2020-12-31 ENCOUNTER — Other Ambulatory Visit: Payer: Self-pay

## 2020-12-31 ENCOUNTER — Ambulatory Visit (INDEPENDENT_AMBULATORY_CARE_PROVIDER_SITE_OTHER): Payer: Medicare PPO | Admitting: Psychiatry

## 2020-12-31 ENCOUNTER — Encounter: Payer: Self-pay | Admitting: Psychiatry

## 2020-12-31 VITALS — BP 108/72 | HR 83 | Temp 98.8°F | Ht 62.25 in | Wt 219.0 lb

## 2020-12-31 DIAGNOSIS — F251 Schizoaffective disorder, depressive type: Secondary | ICD-10-CM

## 2020-12-31 DIAGNOSIS — Z634 Disappearance and death of family member: Secondary | ICD-10-CM | POA: Diagnosis not present

## 2020-12-31 MED ORDER — VENLAFAXINE HCL ER 75 MG PO CP24: 75.0000 mg | ORAL_CAPSULE | Freq: Every day | ORAL | 0 refills | Status: DC

## 2020-12-31 MED ORDER — VENLAFAXINE HCL ER 150 MG PO CP24: 150.0000 mg | ORAL_CAPSULE | Freq: Every day | ORAL | 0 refills | Status: DC

## 2020-12-31 MED ORDER — ARIPIPRAZOLE 2 MG PO TABS: ORAL_TABLET | ORAL | 0 refills | Status: DC

## 2020-12-31 NOTE — Progress Notes (Signed)
BH MD OP Progress Note  12/31/2020 10:29 AM Judith [name]  MRN:  [id number]  Chief Complaint:  Chief Complaint   Follow-up; Depression    HPI: Judith [name] is a 70 year old Caucasian female, widowed,lives in [place], currently on SSI who has a history of schizoaffective disorder, bereavement, cognitive disorder, obstructive sleep apnea on CPAP, diabetes mellitus, hyperlipidemia, fibromyalgia, history of multiple surgeries was evaluated in office today.  Patient reports she continues to grieve the loss of her grandson who passed away in a motor vehicle accident almost a year ago.  She reports his death anniversary is coming up end of September.  She has started psychotherapy session and that has been beneficial.  She reports she is currently taking 1 day at a time.  She does have support from her sister and they live together.  Patient reports she is compliant on her medications like venlafaxine and Abilify.  Denies side effects.  Patient denies any auditory hallucinations at this time.  Patient reports sleep is good.  Patient reports she is part of a research group for diet management, weight loss.  She however cannot exercise due to her bilateral knee problems.  She continues to follow-up with orthopedic provider for the same.  Patient denies any other concerns today.  Visit Diagnosis:    ICD-10-CM   1. Schizoaffective disorder, depressive type (HCC)  F25.1     2. Bereavement  Z63.4       Past Psychiatric History: Reviewed past psychiatric history from progress note on 01/29/2020  Past Medical History:  Past Medical History:  Diagnosis Date   Anginal pain (HCC)    Arthritis    Asthma    Depression    Diabetes mellitus without complication (HCC)    Fibromyalgia    Gastroparesis    GERD (gastroesophageal reflux disease)    Headache    Sleep apnea     Past Surgical History:  Procedure Laterality Date   ABDOMINAL HYSTERECTOMY     BLADDER REPAIR     CATARACT  EXTRACTION Left 02/08/2013   CHOLECYSTECTOMY     ESOPHAGOGASTRODUODENOSCOPY (EGD) WITH PROPOFOL N/A 04/01/2016   Procedure: ESOPHAGOGASTRODUODENOSCOPY (EGD) WITH PROPOFOL;  Surgeon: Scot Jun, MD;  Location: [facility] ENDOSCOPY;  Service: Endoscopy;  Laterality: N/A;   rotator cuff surgery      Family Psychiatric History: Reviewed family psychiatric history from progress note on 01/29/2020  Family History:  Family History  Problem Relation Age of Onset   Bipolar disorder Sister    Bipolar disorder Brother    Breast cancer Neg Hx     Social History: Reviewed social history from progress note on 01/29/2020 Social History   Socioeconomic History   Marital status: Widowed    Spouse name: Not on file   Number of children: 2   Years of education: Not on file   Highest education level: Not on file  Occupational History   Not on file  Tobacco Use   Smoking status: Never   Smokeless tobacco: Never  Substance and Sexual Activity   Alcohol use: Not Currently    Comment: averages lessthan 1 drink per week   Drug use: No   Sexual activity: Never  Other Topics Concern   Not on file  Social History Narrative   Retired   International aid/development worker of [employer] Strain: Not on file  Food Insecurity: Not on file  Transportation Needs: Not on file  Physical Activity: Not on file  Stress: Not on file  Social  Connections: Not on file    Allergies:  Allergies  Allergen Reactions   Metformin Nausea Only   Pravastatin Other (See Comments)    Muscle pain   Topiramate Other (See Comments)    Metabolic Disorder Labs: No results found for: HGBA1C, MPG No results found for: PROLACTIN No results found for: CHOL, TRIG, HDL, CHOLHDL, VLDL, LDLCALC No results found for: TSH  Therapeutic Level Labs: No results found for: LITHIUM No results found for: VALPROATE No components found for:  CBMZ  Current Medications: Current Outpatient Medications  Medication Sig Dispense  Refill   albuterol (PROVENTIL HFA;VENTOLIN HFA) 108 (90 Base) MCG/ACT inhaler Inhale 2 puffs into the lungs every 6 (six) hours as needed for wheezing or shortness of breath.     ARIPiprazole (ABILIFY) 2 MG tablet TAKE 1 TABLET(2 MG) BY MOUTH DAILY 90 tablet 0   azelastine (ASTELIN) 0.1 % nasal spray Place into the nose. (Patient not taking: Reported on 10/01/2020)     B-D ULTRAFINE III SHORT PEN 31G X 8 MM MISC      cetirizine (ZYRTEC) 10 MG tablet Take 10 mg by mouth daily.     gabapentin (NEURONTIN) 100 MG capsule Take 100 mg by mouth 2 (two) times daily.     Galcanezumab-gnlm (EMGALITY) 120 MG/ML SOAJ Inject into the skin.     glipiZIDE (GLUCOTROL XL) 10 MG 24 hr tablet Take 20 mg by mouth daily with breakfast.     glucose blood test strip 1 each by Other route. Use as instructed     guaiFENesin-codeine (ROBITUSSIN AC) 100-10 MG/5ML syrup Take by mouth. (Patient not taking: Reported on 10/01/2020)     HYDROcodone-acetaminophen (NORCO/VICODIN) 5-325 MG tablet Take one tablet at night for pain; may take up to every 6 hours as needed for pain if not working or driving (Patient not taking: Reported on 10/01/2020)     LANTUS SOLOSTAR 100 UNIT/ML Solostar Pen      lisinopril (ZESTRIL) 2.5 MG tablet Take by mouth. (Patient not taking: Reported on 10/01/2020)     metoprolol succinate (TOPROL-XL) 25 MG 24 hr tablet      Multiple Vitamin (MULTI-VITAMIN) tablet Take 1 tablet by mouth daily.     omeprazole (PRILOSEC) 40 MG capsule      pioglitazone (ACTOS) 15 MG tablet  (Patient not taking: Reported on 10/01/2020)     TRULICITY 1.5 MG/0.5ML SOPN      venlafaxine XR (EFFEXOR-XR) 150 MG 24 hr capsule Take 1 capsule (150 mg total) by mouth daily with breakfast. To be combined with 75 mg daily 90 capsule 0   venlafaxine XR (EFFEXOR-XR) 75 MG 24 hr capsule Take 1 capsule (75 mg total) by mouth daily with breakfast. To be combined with 150 mg daily 90 capsule 0   zonisamide (ZONEGRAN) 100 MG capsule Take by mouth.      No current facility-administered medications for this visit.     Musculoskeletal: Strength & Muscle Tone: within normal limits Gait & Station: unsteady, due to knee pain Patient leans: N/A  Psychiatric Specialty Exam: Review of Systems  Musculoskeletal:        Left sided Knee pain   Psychiatric/Behavioral:  Negative for hallucinations.   All other systems reviewed and are negative.  Blood pressure 108/72, pulse 83, temperature 98.8 F (37.1 C), height 5' 2.25" (1.581 m), weight 219 lb (99.3 kg), SpO2 97 %.Body mass index is 39.73 kg/m.  General Appearance: Casual  Eye Contact:  Good  Speech:  Clear and Coherent  Volume:  Normal  Mood:  grieving  Affect:  Congruent  Thought Process:  Goal Directed and Descriptions of Associations: Intact  Orientation:  Full (Time, Place, and Person)  Thought Content: Logical   Suicidal Thoughts:  No  Homicidal Thoughts:  No  Memory:  Immediate;   Fair Recent;   Fair Remote;   Fair  Judgement:  Fair  Insight:  Fair  Psychomotor Activity:  Normal  Concentration:  Concentration: Fair and Attention Span: Fair  Recall:  [employer] of Knowledge: Fair  Language: Fair  Akathisia:  No  Handed:  Right  AIMS (if indicated): done  Assets:  Communication Skills Desire for Improvement Housing Social Support  ADL's:  Intact  Cognition: WNL  Sleep:  Fair   Screenings: GAD-7    Flowsheet Row Video Visit from 10/01/2020 in [facility]  Total GAD-7 Score 0      PHQ2-9    Flowsheet Row Video Visit from 10/01/2020 in [facility] Counselor from 06/27/2020 in [facility] Video Visit from 01/29/2020 in [facility]  PHQ-2 Total Score 0 0 5  PHQ-9 Total Score -- -- 19      Flowsheet Row Counselor from 12/11/2020 in [facility] Counselor from 11/01/2020 in [facility] Video Visit  from 10/01/2020 in [facility]  C-SSRS RISK CATEGORY No Risk No Risk Low Risk        Assessment and Plan: Judith [name] is a 70 year old Caucasian female,widowed, lives in [place] was evaluated in office . Patient is grieving , however is making progress. Will continue plan.  Plan Schizoaffective disorder- stable Abilify 2 mg p.o.daily. Effexor XR  225 mg p.o.daily  Bereavement - improving Provided support. Continue CBT with Ms.Christina Hussami  Patient will benefit from sufficient pain management.  She will follow up with her orthopedic provider.  Follow up in clinic in 2 months in office.  This note was generated in part or whole with voice recognition software. Voice recognition is usually quite accurate but there are transcription errors that can and very often do occur. I apologize for any typographical errors that were not detected and corrected.         Jomarie Longs, MD 12/31/2020, 10:29 AM

## 2021-01-10 ENCOUNTER — Ambulatory Visit: Payer: Medicare PPO | Admitting: Psychiatry

## 2021-01-22 ENCOUNTER — Ambulatory Visit (INDEPENDENT_AMBULATORY_CARE_PROVIDER_SITE_OTHER): Payer: Medicare PPO | Admitting: Licensed Clinical Social Worker

## 2021-01-22 ENCOUNTER — Other Ambulatory Visit: Payer: Self-pay

## 2021-01-22 DIAGNOSIS — F251 Schizoaffective disorder, depressive type: Secondary | ICD-10-CM

## 2021-01-22 DIAGNOSIS — Z634 Disappearance and death of family member: Secondary | ICD-10-CM

## 2021-01-24 NOTE — Progress Notes (Signed)
   THERAPIST PROGRESS NOTE  Session Time: 2-3p  Participation Level: Active  Behavioral Response: NeatAlertAnxious  Type of Therapy: Individual Therapy  Treatment Goals addressed: Coping  Interventions: CBT  Summary: Judith [name] is a 70 y.o. female who presents with stable symptoms related to schizoaffective disorder. Pt reports that she is utilizing coping skills as needed.   Allowed pt safe space to explore and express thoughts and feelings associated with recent life events and external stressors. Discussed how pt is continuing to cope with chronic pain and health related concerns. Pt feels that she is doing a good job managing it all. Pt feels well supported.  Continued recommendations are as follows: self care behaviors, positive social engagements, focusing on overall work/home/life balance, and focusing on positive physical and emotional wellness.   Suicidal/Homicidal: No  Therapist Response: Pt is continuing to apply interventions learned in session into daily life situations. Pt is currently on track to meet goals utilizing interventions mentioned above. Personal growth and progress noted. Treatment to continue as indicated.    Plan: Return again in 4 weeks.  Diagnosis: Axis I: schizoaffective disorder, depressive type    Axis II: No diagnosis    Ernest Haber [place]ah Yeats, LCSW 01/24/2021

## 2021-01-29 ENCOUNTER — Other Ambulatory Visit: Payer: Self-pay | Admitting: Orthopedic Surgery

## 2021-01-29 DIAGNOSIS — M1712 Unilateral primary osteoarthritis, left knee: Secondary | ICD-10-CM

## 2021-02-17 ENCOUNTER — Other Ambulatory Visit: Payer: Self-pay

## 2021-02-17 ENCOUNTER — Ambulatory Visit
Admission: RE | Admit: 2021-02-17 | Discharge: 2021-02-17 | Disposition: A | Discharge status: Home / Self Care | Payer: Medicare PPO | Source: Ambulatory Visit | Attending: Orthopedic Surgery | Admitting: Orthopedic Surgery

## 2021-02-17 DIAGNOSIS — M1712 Unilateral primary osteoarthritis, left knee: Secondary | ICD-10-CM | POA: Insufficient documentation

## 2021-02-26 ENCOUNTER — Other Ambulatory Visit: Payer: Self-pay

## 2021-02-26 ENCOUNTER — Ambulatory Visit (INDEPENDENT_AMBULATORY_CARE_PROVIDER_SITE_OTHER): Payer: Medicare PPO | Admitting: Licensed Clinical Social Worker

## 2021-02-26 DIAGNOSIS — F251 Schizoaffective disorder, depressive type: Secondary | ICD-10-CM | POA: Diagnosis not present

## 2021-02-26 DIAGNOSIS — Z634 Disappearance and death of family member: Secondary | ICD-10-CM | POA: Diagnosis not present

## 2021-02-26 NOTE — Progress Notes (Signed)
   THERAPIST PROGRESS NOTE  Session Time: 2-3p  Participation Level: Active  Behavioral Response: Neat and Well GroomedAlertAnxious and Depressed  Type of Therapy: Individual Therapy  Treatment Goals addressed:   LTG: Sustain symptom remission and continue the gains made during the acute phase of treatment:  per pt self report (Develop healthy thinking patterns and beliefs about self, others, and the world that lead to the alleviation and help prevent the relapse of depression and/or other symptoms)    STG: [name] "Pat" WILL PRACTICE BEHAVIORAL ACTIVATION SKILLS 3X PER WEEK FOR THE NEXT 4 WEEKS (Develop healthy thinking patterns and beliefs about self, others, and the world that lead to the alleviation and help prevent the relapse of depression and/or other symptoms)   Interventions:   Reviewed coping skills Allowed pt to explore recent episode of anxiety Summary: [name] is a 70 y.o. female who presents with continuing symptoms related to depression diagnosis.  Allowed pt to explore and express thoughts and feelings associated with recent life situations and external stressors. Discussed stress associated with continuing knee pain. Discussed coping skills for managing chronic pain. Explored relationships with family members and extended family members.  Continued recommendations are as follows: self care behaviors, positive social engagements, focusing on overall work/home/life balance, and focusing on positive physical and emotional wellness.    Suicidal/Homicidal: No  Therapist Response: Pt is continuing to apply interventions learned in session into daily life situations. Pt is currently on track to meet goals utilizing interventions mentioned above. Personal growth and progress noted. Treatment to continue as indicated.   Plan: Return again in 4 weeks.  Diagnosis: Axis I: Schizoaffectived disorder, depressive type    Axis II: No diagnosis    Ernest Haber Zamarian Scarano,  LCSW 02/26/2021

## 2021-03-01 NOTE — Plan of Care (Signed)
Developed new treatment plan

## 2021-03-04 ENCOUNTER — Ambulatory Visit (INDEPENDENT_AMBULATORY_CARE_PROVIDER_SITE_OTHER): Payer: Medicare PPO | Admitting: Psychiatry

## 2021-03-04 ENCOUNTER — Other Ambulatory Visit: Payer: Self-pay

## 2021-03-04 ENCOUNTER — Encounter: Payer: Self-pay | Admitting: Psychiatry

## 2021-03-04 ENCOUNTER — Other Ambulatory Visit
Admission: RE | Admit: 2021-03-04 | Discharge: 2021-03-04 | Disposition: A | Discharge status: Home / Self Care | Payer: Medicare PPO | Attending: Psychiatry | Admitting: Psychiatry

## 2021-03-04 VITALS — BP 131/85 | HR 88 | Temp 97.8°F | Wt 217.4 lb

## 2021-03-04 DIAGNOSIS — Z634 Disappearance and death of family member: Secondary | ICD-10-CM

## 2021-03-04 DIAGNOSIS — Z79899 Other long term (current) drug therapy: Secondary | ICD-10-CM | POA: Insufficient documentation

## 2021-03-04 DIAGNOSIS — F251 Schizoaffective disorder, depressive type: Secondary | ICD-10-CM

## 2021-03-04 MED ORDER — VENLAFAXINE HCL ER 150 MG PO CP24: 150.0000 mg | ORAL_CAPSULE | Freq: Every day | ORAL | 0 refills | Status: DC

## 2021-03-04 MED ORDER — VENLAFAXINE HCL ER 75 MG PO CP24: 75.0000 mg | ORAL_CAPSULE | Freq: Every day | ORAL | 0 refills | Status: DC

## 2021-03-04 MED ORDER — ARIPIPRAZOLE 2 MG PO TABS: ORAL_TABLET | ORAL | 0 refills | Status: DC

## 2021-03-04 NOTE — Progress Notes (Signed)
BH MD OP Progress Note  03/04/2021 12:49 PM Sloka Volante  MRN:  [id number]  Chief Complaint:  Chief Complaint   Follow-up; Depression    HPI: Judith [name] is a 70 year old Caucasian female, widowed, lives in [place], currently on SSI, has a history of schizoaffective disorder, obstructive sleep apnea on CPAP, diabetes mellitus, hyperlipidemia, fibromyalgia, history of multiple surgeries was evaluated in office today.  Patient today reports she continues to struggle with left-sided knee pain, is scheduled for a total knee replacement soon.  She does worry about it.  She does not have any social support system.  Her sister who lives with her also has medical problems and is unable to support her.  Patient otherwise denies any significant sadness, denies any appetite changes, sleep problems.  She reports she is compliant on medications.  Denies side effects.  Patient denies any suicidality, homicidality or perceptual disturbances.  She is coping better with the loss of her grandson who passed away in a motor vehicle accident a year ago.  Patient denies any other concerns today.  Visit Diagnosis:    ICD-10-CM   1. Schizoaffective disorder, depressive type (HCC)  F25.1 ARIPiprazole (ABILIFY) 2 MG tablet    venlafaxine XR (EFFEXOR-XR) 75 MG 24 hr capsule    venlafaxine XR (EFFEXOR-XR) 150 MG 24 hr capsule    2. Bereavement  Z63.4     3. High risk medication use  Z79.899 Prolactin      Past Psychiatric History: Reviewed past psychiatric history from progress note on 01/29/2020  Past Medical History:  Past Medical History:  Diagnosis Date   Anginal pain (HCC)    Arthritis    Asthma    Depression    Diabetes mellitus without complication (HCC)    Fibromyalgia    Gastroparesis    GERD (gastroesophageal reflux disease)    Headache    Sleep apnea     Past Surgical History:  Procedure Laterality Date   ABDOMINAL HYSTERECTOMY     BLADDER REPAIR     CATARACT EXTRACTION  Left 02/08/2013   CHOLECYSTECTOMY     ESOPHAGOGASTRODUODENOSCOPY (EGD) WITH PROPOFOL N/A 04/01/2016   Procedure: ESOPHAGOGASTRODUODENOSCOPY (EGD) WITH PROPOFOL;  Surgeon: Scot Jun, MD;  Location: [facility] ENDOSCOPY;  Service: Endoscopy;  Laterality: N/A;   rotator cuff surgery      Family Psychiatric History: Reviewed family psychiatric history from progress note on 01/29/2020  Family History:  Family History  Problem Relation Age of Onset   Bipolar disorder Sister    Bipolar disorder Brother    Breast cancer Neg Hx     Social History: Reviewed social history from progress note on 01/29/2020 Social History   Socioeconomic History   Marital status: Widowed    Spouse name: Not on file   Number of children: 2   Years of education: Not on file   Highest education level: Not on file  Occupational History   Not on file  Tobacco Use   Smoking status: Never   Smokeless tobacco: Never  Substance and Sexual Activity   Alcohol use: Not Currently    Comment: averages lessthan 1 drink per week   Drug use: No   Sexual activity: Never  Other Topics Concern   Not on file  Social History Narrative   Retired   International aid/development worker of [employer] Strain: Not on file  Food Insecurity: Not on file  Transportation Needs: Not on file  Physical Activity: Not on file  Stress: Not on file  Social Connections: Not on file    Allergies:  Allergies  Allergen Reactions   Metformin Nausea Only   Pravastatin Other (See Comments)    Muscle pain   Topiramate Other (See Comments)    Metabolic Disorder Labs: No results found for: HGBA1C, MPG No results found for: PROLACTIN No results found for: CHOL, TRIG, HDL, CHOLHDL, VLDL, LDLCALC No results found for: TSH  Therapeutic Level Labs: No results found for: LITHIUM No results found for: VALPROATE No components found for:  CBMZ  Current Medications: Current Outpatient Medications  Medication Sig Dispense Refill    albuterol (PROVENTIL HFA;VENTOLIN HFA) 108 (90 Base) MCG/ACT inhaler Inhale 2 puffs into the lungs every 6 (six) hours as needed for wheezing or shortness of breath.     azelastine (ASTELIN) 0.1 % nasal spray Place into the nose.     B-D ULTRAFINE III SHORT PEN 31G X 8 MM MISC      cetirizine (ZYRTEC) 10 MG tablet Take 10 mg by mouth daily.     gabapentin (NEURONTIN) 100 MG capsule Take 100 mg by mouth 2 (two) times daily.     Galcanezumab-gnlm (EMGALITY) 120 MG/ML SOAJ Inject into the skin.     glipiZIDE (GLUCOTROL XL) 10 MG 24 hr tablet Take 20 mg by mouth daily with breakfast.     glucose blood test strip 1 each by Other route. Use as instructed     guaiFENesin-codeine (ROBITUSSIN AC) 100-10 MG/5ML syrup Take by mouth.     HYDROcodone-acetaminophen (NORCO/VICODIN) 5-325 MG tablet Take one tablet at night for pain; may take up to every 6 hours as needed for pain if not working or driving     LANTUS SOLOSTAR 100 UNIT/ML Solostar Pen      metoprolol succinate (TOPROL-XL) 25 MG 24 hr tablet      Multiple Vitamin (MULTI-VITAMIN) tablet Take 1 tablet by mouth daily.     omeprazole (PRILOSEC) 40 MG capsule      oxybutynin (DITROPAN-XL) 5 MG 24 hr tablet Take by mouth.     pioglitazone (ACTOS) 15 MG tablet      TRULICITY 1.5 MG/0.5ML SOPN      zonisamide (ZONEGRAN) 100 MG capsule Take by mouth.     zonisamide (ZONEGRAN) 100 MG capsule Take by mouth.     ARIPiprazole (ABILIFY) 2 MG tablet TAKE 1 TABLET(2 MG) BY MOUTH DAILY 90 tablet 0   linaclotide (LINZESS) 145 MCG CAPS capsule Take 145 mcg by mouth daily.     lisinopril (ZESTRIL) 2.5 MG tablet Take by mouth. (Patient not taking: No sig reported)     venlafaxine XR (EFFEXOR-XR) 150 MG 24 hr capsule Take 1 capsule (150 mg total) by mouth daily with breakfast. To be combined with 75 mg daily 90 capsule 0   venlafaxine XR (EFFEXOR-XR) 75 MG 24 hr capsule Take 1 capsule (75 mg total) by mouth daily with breakfast. To be combined with 150 mg daily 90  capsule 0   No current facility-administered medications for this visit.     Musculoskeletal: Strength & Muscle Tone:  wnl Gait & Station:  walks with a limp Patient leans: N/A  Psychiatric Specialty Exam: Review of Systems  Musculoskeletal:        Left sided knee pain  Psychiatric/Behavioral:  The patient is nervous/anxious.   All other systems reviewed and are negative.  Blood pressure 131/85, pulse 88, temperature 97.8 F (36.6 C), temperature source Temporal, weight 217 lb 6.4 oz (98.6 kg).Body mass index is 39.44 kg/m.  General Appearance: Casual  Eye Contact:  Fair  Speech:  Clear and Coherent  Volume:  Normal  Mood:  Anxious  Affect:  Congruent  Thought Process:  Goal Directed and Descriptions of Associations: Intact  Orientation:  Full (Time, Place, and Person)  Thought Content: Logical   Suicidal Thoughts:  No  Homicidal Thoughts:  No  Memory:  Immediate;   Fair Recent;   Fair Remote;   Fair  Judgement:  Fair  Insight:  Fair  Psychomotor Activity:  Normal  Concentration:  Concentration: Fair and Attention Span: Fair  Recall:  [employer] of Knowledge: Fair  Language: Fair  Akathisia:  No  Handed:  Right  AIMS (if indicated): done, 0  Assets:  Communication Skills Desire for Improvement Housing Social Support  ADL's:  Intact  Cognition: WNL  Sleep:  Fair   Screenings: AIMS    Flowsheet Row Office Visit from 12/31/2020 in [facility]  AIMS Total Score 0      GAD-7    Flowsheet Row Office Visit from 12/31/2020 in [facility] Video Visit from 10/01/2020 in [facility]  Total GAD-7 Score 0 0      PHQ2-9    Flowsheet Row Office Visit from 03/04/2021 in [facility] Office Visit from 12/31/2020 in [facility] Video Visit from 10/01/2020 in [facility] Counselor from 06/27/2020 in [facility] Video Visit from 01/29/2020 in [facility]  PHQ-2 Total Score 0 0 0 0 5  PHQ-9 Total Score -- -- -- -- 19      Flowsheet Row Office Visit from 03/04/2021 in [facility] Counselor from 01/22/2021 in [facility] Office Visit from 12/31/2020 in [facility]  C-SSRS RISK CATEGORY No Risk No Risk No Risk        Assessment and Plan: Judith [name] is a 70 year old Caucasian female, widowed, lives in [place] , has a history of schizoaffective disorder, left knee pain scheduled for knee replacement was evaluated in office today.  Patient does have anxiety about her upcoming knee surgery otherwise is doing well.  Plan as noted below.  Plan Schizoaffective disorder-stable Abilify 2 mg p.o. daily Effexor extended release 225 mg p.o. daily  Bereavement-stable We will monitor closely Continue CBT with her therapist  High risk medication use-will order prolactin level.  She will go to [facility] lab  Patient will continue to need sufficient pain management.  Follow-up in clinic in 3 months or sooner in office.  This note was generated in part or whole with voice recognition software. Voice recognition is usually quite accurate but there are transcription errors that can and very often do occur. I apologize for any typographical errors that were not detected and corrected.     Jomarie Longs, MD 03/04/2021, 12:49 PM

## 2021-03-05 LAB — PROLACTIN: Prolactin: 10.4 ng/mL (ref 4.8–23.3)

## 2021-04-07 ENCOUNTER — Other Ambulatory Visit: Payer: Self-pay | Admitting: Orthopedic Surgery

## 2021-04-10 ENCOUNTER — Ambulatory Visit: Payer: Medicare PPO | Admitting: Licensed Clinical Social Worker

## 2021-04-14 ENCOUNTER — Encounter
Admission: RE | Admit: 2021-04-14 | Discharge: 2021-04-14 | Disposition: A | Discharge status: Home / Self Care | Payer: Medicare PPO | Source: Ambulatory Visit | Attending: Orthopedic Surgery | Admitting: Orthopedic Surgery

## 2021-04-14 ENCOUNTER — Other Ambulatory Visit: Payer: Self-pay

## 2021-04-14 VITALS — BP 112/65 | HR 93 | Temp 98.6°F | Resp 16 | Ht 62.0 in | Wt 215.8 lb

## 2021-04-14 DIAGNOSIS — Z01818 Encounter for other preprocedural examination: Secondary | ICD-10-CM | POA: Diagnosis present

## 2021-04-14 DIAGNOSIS — Z01812 Encounter for preprocedural laboratory examination: Secondary | ICD-10-CM

## 2021-04-14 HISTORY — DX: Cardiac arrhythmia, unspecified: I49.9

## 2021-04-14 LAB — SURGICAL PCR SCREEN
MRSA, PCR: NEGATIVE
Staphylococcus aureus: NEGATIVE

## 2021-04-14 LAB — COMPREHENSIVE METABOLIC PANEL
ALT: 16 U/L (ref 0–44)
AST: 16 U/L (ref 15–41)
Albumin: 3.7 g/dL (ref 3.5–5.0)
Alkaline Phosphatase: 125 U/L (ref 38–126)
Anion gap: 6 (ref 5–15)
BUN: 18 mg/dL (ref 8–23)
CO2: 25 mmol/L (ref 22–32)
Calcium: 9.1 mg/dL (ref 8.9–10.3)
Chloride: 107 mmol/L (ref 98–111)
Creatinine, Ser: 0.82 mg/dL (ref 0.44–1.00)
GFR, Estimated: >60 mL/min (ref >60)
Glucose, Bld: 179 mg/dL — ABNORMAL HIGH (ref 70–99)
Potassium: 4.1 mmol/L (ref 3.5–5.1)
Sodium: 138 mmol/L (ref 135–145)
Total Bilirubin: 0.6 mg/dL (ref 0.3–1.2)
Total Protein: 7.7 g/dL (ref 6.5–8.1)

## 2021-04-14 LAB — URINALYSIS, ROUTINE W REFLEX MICROSCOPIC
Bilirubin Urine: NEGATIVE
Glucose, UA: NEGATIVE mg/dL
Hgb urine dipstick: NEGATIVE
Ketones, ur: NEGATIVE mg/dL
Leukocytes,Ua: NEGATIVE
Nitrite: NEGATIVE
Protein, ur: NEGATIVE mg/dL
Specific Gravity, Urine: 1.016 (ref 1.005–1.030)
pH: 5 (ref 5.0–8.0)

## 2021-04-14 LAB — CBC WITH DIFFERENTIAL/PLATELET
Abs Immature Granulocytes: 0.05 10*3/uL (ref 0.00–0.07)
Basophils Absolute: 0 10*3/uL (ref 0.0–0.1)
Basophils Relative: 0 %
Eosinophils Absolute: 0.1 10*3/uL (ref 0.0–0.5)
Eosinophils Relative: 2 %
HCT: 37.2 % (ref 36.0–46.0)
Hemoglobin: 12.5 g/dL (ref 12.0–15.0)
Immature Granulocytes: 1 %
Lymphocytes Relative: 30 %
Lymphs Abs: 2.5 10*3/uL (ref 0.7–4.0)
MCH: 32 pg (ref 26.0–34.0)
MCHC: 33.6 g/dL (ref 30.0–36.0)
MCV: 95.1 fL (ref 80.0–100.0)
Monocytes Absolute: 0.6 10*3/uL (ref 0.1–1.0)
Monocytes Relative: 7 %
Neutro Abs: 4.9 10*3/uL (ref 1.7–7.7)
Neutrophils Relative %: 60 %
Platelets: 258 10*3/uL (ref 150–400)
RBC: 3.91 MIL/uL (ref 3.87–5.11)
RDW: 13 % (ref 11.5–15.5)
WBC: 8.2 10*3/uL (ref 4.0–10.5)
nRBC: 0 % (ref 0.0–0.2)

## 2021-04-14 LAB — TYPE AND SCREEN
ABO/RH(D): A POS
Antibody Screen: NEGATIVE

## 2021-04-14 NOTE — Patient Instructions (Addendum)
Your procedure is scheduled on: Thursday April 24, 2021. Report to Day Surgery inside [facility] 2nd floor. To find out your arrival time please call [phone] between 1PM - 3PM on Wednesday April 23, 2021.  Remember: Instructions that are not followed completely may result in serious medical risk,  up to and including death, or upon the discretion of your surgeon and anesthesiologist your  surgery may need to be rescheduled.     _X__ 1. Do not eat food after midnight the night before your procedure.                 No chewing gum or hard candies. You may drink clear liquids up to 2 hours                 before you are scheduled to arrive for your surgery- DO not drink clear                 liquids within 2 hours of the start of your surgery.                 Clear Liquids include:  water, Black Coffee or Tea (Do not add                 anything to coffee or tea).  __X__2.  On the morning of surgery brush your teeth with toothpaste and water, you                may rinse your mouth with mouthwash if you wish.  Do not swallow any toothpaste of mouthwash.     _X__ 3.  No Alcohol for 24 hours before or after surgery.   _X__ 4.  Do Not Smoke or use e-cigarettes For 24 Hours Prior to Your Surgery.                 Do not use any chewable tobacco products for at least 6 hours prior to                 Surgery.  _X__  5.  Do not use any recreational drugs (marijuana, cocaine, heroin, ecstasy, MDMA or other)                For at least one week prior to your surgery.  Combination of these drugs with anesthesia                May have life threatening results.  __X__6.  Notify your doctor if there is any change in your medical condition      (cold, fever, infections).     Do not wear jewelry, make-up, hairpins, clips or nail polish. Do not wear lotions, powders, or perfumes. You may wear deodorant. Do not shave 48 hours prior to surgery. Men may shave face and  neck. Do not bring valuables to the hospital.    [facility] is not responsible for any belongings or valuables.  Contacts, dentures or bridgework may not be worn into surgery. Leave your suitcase in the car. After surgery it may be brought to your room. For patients admitted to the hospital, discharge time is determined by your treatment team.   Patients discharged the day of surgery will not be allowed to drive home.   Make arrangements for someone to be with you for the first 24 hours of your Same Day Discharge.    Please read over the following fact sheets that you were given:   Total Joint  Packet    __X__ Take these medicines the morning of surgery with A SIP OF WATER:    1. omeprazole (PRILOSEC) 40 MG   2.   3.   4.   5.   6.   ____ Fleet Enema (as directed)   __X__ Use CHG Soap (or wipes) as directed  ____ Use Benzoyl Peroxide Gel as instructed  ____ Use inhalers on the day of surgery  ____ Stop metformin 2 days prior to surgery    __X__ Take 1/2 of usual insulin dose the night before surgery. No insulin the morning          of surgery. LANTUS SOLOSTAR 100 UNIT/ML Solostar Pen (only take 14 units the night before your surgery)  ____ Call your PCP, cardiologist, or Pulmonologist if taking Coumadin/Plavix/aspirin and ask when to stop before your surgery.   __X__ One Week prior to surgery- Stop Anti-inflammatories such as Ibuprofen, Aleve, Advil, Motrin, meloxicam (MOBIC), diclofenac, etodolac, ketorolac, Toradol, Daypro, piroxicam, Goody's or BC powders. OK TO USE TYLENOL IF NEEDED   __X__ Stop supplements until after surgery.    __X__ Bring C-Pap to the hospital.    If you have any questions regarding your pre-procedure instructions,  Please call Pre-admit Testing at [phone]

## 2021-04-22 ENCOUNTER — Other Ambulatory Visit: Payer: Self-pay

## 2021-04-22 ENCOUNTER — Other Ambulatory Visit
Admission: RE | Admit: 2021-04-22 | Discharge: 2021-04-22 | Disposition: A | Discharge status: Home / Self Care | Payer: Medicare PPO | Source: Ambulatory Visit | Attending: Orthopedic Surgery | Admitting: Orthopedic Surgery

## 2021-04-22 DIAGNOSIS — Z20822 Contact with and (suspected) exposure to covid-19: Secondary | ICD-10-CM | POA: Insufficient documentation

## 2021-04-22 DIAGNOSIS — I700 Atherosclerosis of aorta: Secondary | ICD-10-CM | POA: Insufficient documentation

## 2021-04-22 LAB — SARS CORONAVIRUS 2 (TAT 6-24 HRS): SARS Coronavirus 2: NEGATIVE

## 2021-04-24 ENCOUNTER — Observation Stay
Admission: RE | Admit: 2021-04-24 | Discharge: 2021-04-26 | Disposition: A | Discharge status: Home / Self Care | Payer: Medicare PPO | Attending: Orthopedic Surgery | Admitting: Orthopedic Surgery

## 2021-04-24 ENCOUNTER — Ambulatory Visit: Payer: Medicare PPO | Admitting: Urgent Care

## 2021-04-24 ENCOUNTER — Other Ambulatory Visit: Payer: Self-pay

## 2021-04-24 ENCOUNTER — Encounter
Admission: RE | Disposition: A | Discharge status: Home / Self Care | Payer: Self-pay | Source: Home / Self Care | Attending: Orthopedic Surgery

## 2021-04-24 ENCOUNTER — Encounter: Payer: Self-pay | Admitting: Orthopedic Surgery

## 2021-04-24 ENCOUNTER — Observation Stay: Payer: Medicare PPO

## 2021-04-24 DIAGNOSIS — Z79899 Other long term (current) drug therapy: Secondary | ICD-10-CM | POA: Insufficient documentation

## 2021-04-24 DIAGNOSIS — Z794 Long term (current) use of insulin: Secondary | ICD-10-CM | POA: Diagnosis not present

## 2021-04-24 DIAGNOSIS — N183 Chronic kidney disease, stage 3 unspecified: Secondary | ICD-10-CM | POA: Insufficient documentation

## 2021-04-24 DIAGNOSIS — M1712 Unilateral primary osteoarthritis, left knee: Secondary | ICD-10-CM | POA: Diagnosis not present

## 2021-04-24 DIAGNOSIS — Z20822 Contact with and (suspected) exposure to covid-19: Secondary | ICD-10-CM | POA: Insufficient documentation

## 2021-04-24 DIAGNOSIS — I129 Hypertensive chronic kidney disease with stage 1 through stage 4 chronic kidney disease, or unspecified chronic kidney disease: Secondary | ICD-10-CM | POA: Diagnosis not present

## 2021-04-24 DIAGNOSIS — G8918 Other acute postprocedural pain: Secondary | ICD-10-CM

## 2021-04-24 DIAGNOSIS — Z96652 Presence of left artificial knee joint: Secondary | ICD-10-CM

## 2021-04-24 DIAGNOSIS — J45909 Unspecified asthma, uncomplicated: Secondary | ICD-10-CM | POA: Diagnosis not present

## 2021-04-24 DIAGNOSIS — E1122 Type 2 diabetes mellitus with diabetic chronic kidney disease: Secondary | ICD-10-CM | POA: Insufficient documentation

## 2021-04-24 HISTORY — PX: TOTAL KNEE ARTHROPLASTY: SHX125

## 2021-04-24 LAB — CREATININE, SERUM
Creatinine, Ser: 0.81 mg/dL (ref 0.44–1.00)
GFR, Estimated: >60 mL/min (ref >60)

## 2021-04-24 LAB — GLUCOSE, CAPILLARY
Glucose-Capillary: 148 mg/dL — ABNORMAL HIGH (ref 70–99)
Glucose-Capillary: 120 mg/dL — ABNORMAL HIGH (ref 70–99)
Glucose-Capillary: 196 mg/dL — ABNORMAL HIGH (ref 70–99)
Glucose-Capillary: 216 mg/dL — ABNORMAL HIGH (ref 70–99)

## 2021-04-24 LAB — CBC
HCT: 31.6 % — ABNORMAL LOW (ref 36.0–46.0)
Hemoglobin: 10.5 g/dL — ABNORMAL LOW (ref 12.0–15.0)
MCH: 31.2 pg (ref 26.0–34.0)
MCHC: 33.2 g/dL (ref 30.0–36.0)
MCV: 93.8 fL (ref 80.0–100.0)
Platelets: 228 10*3/uL (ref 150–400)
RBC: 3.37 MIL/uL — ABNORMAL LOW (ref 3.87–5.11)
RDW: 13.1 % (ref 11.5–15.5)
WBC: 12.5 10*3/uL — ABNORMAL HIGH (ref 4.0–10.5)
nRBC: 0 % (ref 0.0–0.2)

## 2021-04-24 SURGERY — ARTHROPLASTY, KNEE, TOTAL
Anesthesia: Spinal | Site: Knee | Laterality: Left

## 2021-04-24 MED ORDER — EPHEDRINE 5 MG/ML INJ
INTRAVENOUS | Status: AC
  Filled 2021-04-24: qty 5

## 2021-04-24 MED ORDER — ZONISAMIDE 100 MG PO CAPS
100.0000 mg | ORAL_CAPSULE | Freq: Every day | ORAL | Status: DC
  Administered 2021-04-24 – 2021-04-26 (×3): 100 mg via ORAL
  Filled 2021-04-24 (×3): qty 1

## 2021-04-24 MED ORDER — CEFAZOLIN SODIUM-DEXTROSE 2-4 GM/100ML-% IV SOLN
INTRAVENOUS | Status: AC
  Filled 2021-04-24: qty 100

## 2021-04-24 MED ORDER — MIDAZOLAM HCL 5 MG/5ML IJ SOLN
INTRAMUSCULAR | Status: DC | PRN
  Administered 2021-04-24: 2 mg via INTRAVENOUS

## 2021-04-24 MED ORDER — ARIPIPRAZOLE 2 MG PO TABS
2.0000 mg | ORAL_TABLET | Freq: Every day | ORAL | Status: DC
  Administered 2021-04-24 – 2021-04-26 (×3): 2 mg via ORAL
  Filled 2021-04-24 (×3): qty 1

## 2021-04-24 MED ORDER — CHLORHEXIDINE GLUCONATE 0.12 % MT SOLN
OROMUCOSAL | Status: AC
  Filled 2021-04-24: qty 15

## 2021-04-24 MED ORDER — SODIUM CHLORIDE 0.9 % IV SOLN: INTRAVENOUS | Status: DC

## 2021-04-24 MED ORDER — METHOCARBAMOL 1000 MG/10ML IJ SOLN
500.0000 mg | Freq: Four times a day (QID) | INTRAVENOUS | Status: DC | PRN
  Filled 2021-04-24: qty 5

## 2021-04-24 MED ORDER — SODIUM CHLORIDE (PF) 0.9 % IJ SOLN
INTRAMUSCULAR | Status: DC | PRN
  Administered 2021-04-24: 09:00:00 91 mL

## 2021-04-24 MED ORDER — FLEET ENEMA 7-19 GM/118ML RE ENEM: 1.0000 | ENEMA | Freq: Once | RECTAL | Status: DC | PRN

## 2021-04-24 MED ORDER — CEFAZOLIN SODIUM-DEXTROSE 2-4 GM/100ML-% IV SOLN
2.0000 g | Freq: Four times a day (QID) | INTRAVENOUS | Status: AC
  Administered 2021-04-24 – 2021-04-25 (×2): 2 g via INTRAVENOUS
  Filled 2021-04-24 (×2): qty 100

## 2021-04-24 MED ORDER — CHLORHEXIDINE GLUCONATE 0.12 % MT SOLN
15.0000 mL | Freq: Once | OROMUCOSAL | Status: AC
  Administered 2021-04-24: 07:00:00 15 mL via OROMUCOSAL

## 2021-04-24 MED ORDER — GABAPENTIN 100 MG PO CAPS
200.0000 mg | ORAL_CAPSULE | Freq: Every day | ORAL | Status: DC
  Administered 2021-04-24 – 2021-04-25 (×2): 200 mg via ORAL
  Filled 2021-04-24 (×3): qty 2

## 2021-04-24 MED ORDER — NEOMYCIN-POLYMYXIN B GU 40-200000 IR SOLN
Status: AC
  Filled 2021-04-24: qty 20

## 2021-04-24 MED ORDER — FENTANYL CITRATE (PF) 100 MCG/2ML IJ SOLN: 25.0000 ug | INTRAMUSCULAR | Status: DC | PRN

## 2021-04-24 MED ORDER — GLYCOPYRROLATE 0.2 MG/ML IJ SOLN
INTRAMUSCULAR | Status: DC | PRN
  Administered 2021-04-24: .2 mg via INTRAVENOUS

## 2021-04-24 MED ORDER — PHENYLEPHRINE HCL-NACL 20-0.9 MG/250ML-% IV SOLN
INTRAVENOUS | Status: DC | PRN
  Administered 2021-04-24: 25 ug/min via INTRAVENOUS

## 2021-04-24 MED ORDER — PRONTOSAN WOUND IRRIGATION OPTIME
TOPICAL | Status: DC | PRN
  Administered 2021-04-24: 1 via TOPICAL

## 2021-04-24 MED ORDER — SODIUM CHLORIDE FLUSH 0.9 % IV SOLN
INTRAVENOUS | Status: AC
  Filled 2021-04-24: qty 40

## 2021-04-24 MED ORDER — ONDANSETRON HCL 4 MG/2ML IJ SOLN: 4.0000 mg | Freq: Four times a day (QID) | INTRAMUSCULAR | Status: DC | PRN

## 2021-04-24 MED ORDER — ENOXAPARIN SODIUM 30 MG/0.3ML IJ SOSY
30.0000 mg | PREFILLED_SYRINGE | Freq: Two times a day (BID) | INTRAMUSCULAR | Status: DC
  Administered 2021-04-25 – 2021-04-26 (×3): 30 mg via SUBCUTANEOUS
  Filled 2021-04-24 (×3): qty 0.3

## 2021-04-24 MED ORDER — MIDAZOLAM HCL 2 MG/2ML IJ SOLN
INTRAMUSCULAR | Status: AC
  Filled 2021-04-24: qty 2

## 2021-04-24 MED ORDER — POLYETHYLENE GLYCOL 3350 17 G PO PACK: 17.0000 g | PACK | Freq: Every day | ORAL | Status: DC | PRN

## 2021-04-24 MED ORDER — ONDANSETRON HCL 4 MG/2ML IJ SOLN: 4.0000 mg | Freq: Once | INTRAMUSCULAR | Status: DC | PRN

## 2021-04-24 MED ORDER — SODIUM CHLORIDE 0.9 % IR SOLN
Status: DC | PRN
  Administered 2021-04-24: 08:00:00 3012 mL

## 2021-04-24 MED ORDER — ZOLPIDEM TARTRATE 5 MG PO TABS: 5.0000 mg | ORAL_TABLET | Freq: Every evening | ORAL | Status: DC | PRN

## 2021-04-24 MED ORDER — ACETAMINOPHEN 10 MG/ML IV SOLN
INTRAVENOUS | Status: AC
  Filled 2021-04-24: qty 100

## 2021-04-24 MED ORDER — ACETAMINOPHEN 10 MG/ML IV SOLN: 1000.0000 mg | Freq: Once | INTRAVENOUS | Status: DC | PRN

## 2021-04-24 MED ORDER — HYDROCODONE-ACETAMINOPHEN 5-325 MG PO TABS
1.0000 | ORAL_TABLET | ORAL | Status: DC | PRN
  Administered 2021-04-24: 15:00:00 1 via ORAL
  Administered 2021-04-24: 20:00:00 2 via ORAL
  Filled 2021-04-24 (×2): qty 2

## 2021-04-24 MED ORDER — EPHEDRINE SULFATE 50 MG/ML IJ SOLN
INTRAMUSCULAR | Status: DC | PRN
  Administered 2021-04-24: 5 mg via INTRAVENOUS

## 2021-04-24 MED ORDER — ALBUTEROL SULFATE (2.5 MG/3ML) 0.083% IN NEBU: 3.0000 mL | INHALATION_SOLUTION | Freq: Four times a day (QID) | RESPIRATORY_TRACT | Status: DC | PRN

## 2021-04-24 MED ORDER — ORAL CARE MOUTH RINSE: 15.0000 mL | Freq: Once | OROMUCOSAL | Status: AC

## 2021-04-24 MED ORDER — MENTHOL 3 MG MT LOZG
1.0000 | LOZENGE | OROMUCOSAL | Status: DC | PRN
  Filled 2021-04-24: qty 9

## 2021-04-24 MED ORDER — METHOCARBAMOL 500 MG PO TABS
500.0000 mg | ORAL_TABLET | Freq: Four times a day (QID) | ORAL | Status: DC | PRN
  Administered 2021-04-24 – 2021-04-25 (×2): 500 mg via ORAL
  Filled 2021-04-24 (×2): qty 1

## 2021-04-24 MED ORDER — MORPHINE SULFATE (PF) 2 MG/ML IV SOLN
0.5000 mg | INTRAVENOUS | Status: DC | PRN
  Administered 2021-04-24 – 2021-04-25 (×2): 1 mg via INTRAVENOUS
  Filled 2021-04-24 (×2): qty 1

## 2021-04-24 MED ORDER — CEFAZOLIN SODIUM-DEXTROSE 2-4 GM/100ML-% IV SOLN
2.0000 g | INTRAVENOUS | Status: AC
  Administered 2021-04-24: 08:00:00 2 g via INTRAVENOUS

## 2021-04-24 MED ORDER — BISACODYL 10 MG RE SUPP: 10.0000 mg | Freq: Every day | RECTAL | Status: DC | PRN

## 2021-04-24 MED ORDER — PROPOFOL 1000 MG/100ML IV EMUL
INTRAVENOUS | Status: AC
  Filled 2021-04-24: qty 100

## 2021-04-24 MED ORDER — ACETAMINOPHEN 10 MG/ML IV SOLN
INTRAVENOUS | Status: DC | PRN
  Administered 2021-04-24: 1000 mg via INTRAVENOUS

## 2021-04-24 MED ORDER — BUPIVACAINE-EPINEPHRINE (PF) 0.25% -1:200000 IJ SOLN
INTRAMUSCULAR | Status: AC
  Filled 2021-04-24: qty 30

## 2021-04-24 MED ORDER — DOCUSATE SODIUM 100 MG PO CAPS
100.0000 mg | ORAL_CAPSULE | Freq: Two times a day (BID) | ORAL | Status: DC
  Administered 2021-04-24 – 2021-04-26 (×5): 100 mg via ORAL
  Filled 2021-04-24 (×5): qty 1

## 2021-04-24 MED ORDER — PANTOPRAZOLE SODIUM 40 MG PO TBEC
40.0000 mg | DELAYED_RELEASE_TABLET | Freq: Every day | ORAL | Status: DC
  Administered 2021-04-25 – 2021-04-26 (×2): 40 mg via ORAL
  Filled 2021-04-24 (×2): qty 1

## 2021-04-24 MED ORDER — MORPHINE SULFATE (PF) 10 MG/ML IV SOLN
INTRAVENOUS | Status: AC
  Filled 2021-04-24: qty 1

## 2021-04-24 MED ORDER — SODIUM CHLORIDE 0.9 % IR SOLN
Status: DC | PRN
  Administered 2021-04-24: 08:00:00 150 mL

## 2021-04-24 MED ORDER — ADULT MULTIVITAMIN W/MINERALS CH
1.0000 | ORAL_TABLET | Freq: Every day | ORAL | Status: DC
Start: 2021-04-24 — End: 2021-04-26
  Administered 2021-04-24 – 2021-04-26 (×3): 1 via ORAL
  Filled 2021-04-24 (×3): qty 1

## 2021-04-24 MED ORDER — VENLAFAXINE HCL ER 75 MG PO CP24
75.0000 mg | ORAL_CAPSULE | Freq: Every day | ORAL | Status: DC
  Administered 2021-04-24 – 2021-04-26 (×3): 75 mg via ORAL
  Filled 2021-04-24 (×3): qty 1

## 2021-04-24 MED ORDER — PROPOFOL 500 MG/50ML IV EMUL
INTRAVENOUS | Status: DC | PRN
Start: 2021-04-24 — End: 2021-04-24
  Administered 2021-04-24: 200 ug/kg/min via INTRAVENOUS

## 2021-04-24 MED ORDER — ALUM & MAG HYDROXIDE-SIMETH 200-200-20 MG/5ML PO SUSP: 30.0000 mL | ORAL | Status: DC | PRN

## 2021-04-24 MED ORDER — ACETAMINOPHEN 325 MG PO TABS: 325.0000 mg | ORAL_TABLET | Freq: Four times a day (QID) | ORAL | Status: DC | PRN

## 2021-04-24 MED ORDER — ONDANSETRON HCL 4 MG PO TABS: 4.0000 mg | ORAL_TABLET | Freq: Four times a day (QID) | ORAL | Status: DC | PRN

## 2021-04-24 MED ORDER — MIDAZOLAM HCL 5 MG/5ML IJ SOLN: INTRAMUSCULAR | Status: DC | PRN

## 2021-04-24 MED ORDER — PHENYLEPHRINE HCL (PRESSORS) 10 MG/ML IV SOLN
INTRAVENOUS | Status: AC
  Filled 2021-04-24: qty 1

## 2021-04-24 MED ORDER — BUPIVACAINE HCL (PF) 0.5 % IJ SOLN
INTRAMUSCULAR | Status: DC | PRN
  Administered 2021-04-24: 2.4 mL

## 2021-04-24 MED ORDER — LORATADINE 10 MG PO TABS
10.0000 mg | ORAL_TABLET | Freq: Every day | ORAL | Status: DC
  Administered 2021-04-24 – 2021-04-26 (×3): 10 mg via ORAL
  Filled 2021-04-24 (×3): qty 1

## 2021-04-24 MED ORDER — PHENOL 1.4 % MT LIQD
1.0000 | OROMUCOSAL | Status: DC | PRN
  Filled 2021-04-24: qty 177

## 2021-04-24 MED ORDER — DIPHENHYDRAMINE HCL 12.5 MG/5ML PO ELIX: 12.5000 mg | ORAL_SOLUTION | ORAL | Status: DC | PRN

## 2021-04-24 MED ORDER — OXYCODONE HCL 5 MG/5ML PO SOLN: 5.0000 mg | Freq: Once | ORAL | Status: DC | PRN

## 2021-04-24 MED ORDER — BUPIVACAINE LIPOSOME 1.3 % IJ SUSP
INTRAMUSCULAR | Status: AC
  Filled 2021-04-24: qty 20

## 2021-04-24 MED ORDER — HYDROCODONE-ACETAMINOPHEN 7.5-325 MG PO TABS
1.0000 | ORAL_TABLET | ORAL | Status: DC | PRN
  Administered 2021-04-25 – 2021-04-26 (×4): 2 via ORAL
  Filled 2021-04-24 (×4): qty 2

## 2021-04-24 MED ORDER — TRAMADOL HCL 50 MG PO TABS
50.0000 mg | ORAL_TABLET | Freq: Four times a day (QID) | ORAL | Status: DC
  Administered 2021-04-24 – 2021-04-26 (×6): 50 mg via ORAL
  Filled 2021-04-24 (×7): qty 1

## 2021-04-24 MED ORDER — GLIPIZIDE ER 10 MG PO TB24
20.0000 mg | ORAL_TABLET | Freq: Every day | ORAL | Status: DC
  Administered 2021-04-25 – 2021-04-26 (×2): 20 mg via ORAL
  Filled 2021-04-24 (×2): qty 2

## 2021-04-24 MED ORDER — METOPROLOL SUCCINATE ER 25 MG PO TB24
25.0000 mg | ORAL_TABLET | Freq: Every day | ORAL | Status: DC
  Administered 2021-04-24 – 2021-04-25 (×2): 25 mg via ORAL
  Filled 2021-04-24 (×2): qty 1

## 2021-04-24 MED ORDER — PIOGLITAZONE HCL 15 MG PO TABS
15.0000 mg | ORAL_TABLET | Freq: Every day | ORAL | Status: DC
  Administered 2021-04-24 – 2021-04-26 (×3): 15 mg via ORAL
  Filled 2021-04-24 (×3): qty 1

## 2021-04-24 MED ORDER — INSULIN GLARGINE-YFGN 100 UNIT/ML ~~LOC~~ SOLN
28.0000 [IU] | Freq: Every day | SUBCUTANEOUS | Status: DC
  Administered 2021-04-24 – 2021-04-25 (×2): 28 [IU] via SUBCUTANEOUS
  Filled 2021-04-24 (×3): qty 0.28

## 2021-04-24 MED ORDER — INSULIN ASPART 100 UNIT/ML IJ SOLN
0.0000 [IU] | Freq: Three times a day (TID) | INTRAMUSCULAR | Status: DC
  Administered 2021-04-24: 17:00:00 3 [IU] via SUBCUTANEOUS
  Administered 2021-04-25 (×3): 5 [IU] via SUBCUTANEOUS
  Administered 2021-04-26: 3 [IU] via SUBCUTANEOUS
  Filled 2021-04-24 (×5): qty 1

## 2021-04-24 MED ORDER — VENLAFAXINE HCL ER 150 MG PO CP24
150.0000 mg | ORAL_CAPSULE | Freq: Every day | ORAL | Status: DC
  Administered 2021-04-25 – 2021-04-26 (×2): 150 mg via ORAL
  Filled 2021-04-24 (×2): qty 1

## 2021-04-24 MED ORDER — OXYCODONE HCL 5 MG PO TABS: 5.0000 mg | ORAL_TABLET | Freq: Once | ORAL | Status: DC | PRN

## 2021-04-24 MED ORDER — FENTANYL CITRATE (PF) 100 MCG/2ML IJ SOLN
INTRAMUSCULAR | Status: AC
  Administered 2021-04-24: 12:00:00 50 ug via INTRAVENOUS
  Filled 2021-04-24: qty 2

## 2021-04-24 MED ORDER — AZELASTINE HCL 0.1 % NA SOLN
1.0000 | Freq: Every day | NASAL | Status: DC | PRN
  Filled 2021-04-24: qty 30

## 2021-04-24 MED ORDER — DULAGLUTIDE 4.5 MG/0.5ML ~~LOC~~ SOAJ
4.5000 mg | SUBCUTANEOUS | Status: DC
Start: 2021-04-24 — End: 2021-04-24

## 2021-04-24 MED ORDER — OXYBUTYNIN CHLORIDE ER 5 MG PO TB24
5.0000 mg | ORAL_TABLET | Freq: Every day | ORAL | Status: DC
  Administered 2021-04-24 – 2021-04-26 (×3): 5 mg via ORAL
  Filled 2021-04-24 (×3): qty 1

## 2021-04-24 SURGICAL SUPPLY — 73 items
BLADE SAGITTAL 25.0X1.19X90 (BLADE) ×2 IMPLANT
BLADE SAW 90X13X1.19 OSCILLAT (BLADE) ×2 IMPLANT
BLOCK CUTTING FEMUR 2+ LT MED (MISCELLANEOUS) ×1 IMPLANT
BLOCK CUTTING TIBIAL 2 LT (MISCELLANEOUS) ×1 IMPLANT
BNDG ELASTIC 6X5.8 VLCR STR LF (GAUZE/BANDAGES/DRESSINGS) ×2 IMPLANT
CANISTER WOUND CARE 500ML ATS (WOUND CARE) ×2 IMPLANT
CEMENT HV SMART SET (Cement) ×4 IMPLANT
CEMENT PATELLA RESURF SZ1 (Cement) ×1 IMPLANT
CHLORAPREP W/TINT 26 (MISCELLANEOUS) ×4 IMPLANT
COOLER POLAR GLACIER W/PUMP (MISCELLANEOUS) ×2 IMPLANT
CUFF TOURN SGL QUICK 24 (TOURNIQUET CUFF)
CUFF TOURN SGL QUICK 34 (TOURNIQUET CUFF)
CUFF TRNQT CYL 24X4X16.5-23 (TOURNIQUET CUFF) IMPLANT
CUFF TRNQT CYL 34X4.125X (TOURNIQUET CUFF) IMPLANT
DRAPE 3/4 80X56 (DRAPES) ×4 IMPLANT
DRSG MEPILEX SACRM 8.7X9.8 (GAUZE/BANDAGES/DRESSINGS) ×2 IMPLANT
ELECT CAUTERY BLADE 6.4 (BLADE) ×2 IMPLANT
ELECT REM PT RETURN 9FT ADLT (ELECTROSURGICAL) ×2
ELECTRODE REM PT RTRN 9FT ADLT (ELECTROSURGICAL) ×1 IMPLANT
FEMERAL COMPONENT LEFT SZ2P (Femur) ×1 IMPLANT
FEMUR BONE MODEL 4.9010 MEDACT (MISCELLANEOUS) ×1 IMPLANT
GAUZE 4X4 16PLY ~~LOC~~+RFID DBL (SPONGE) ×1 IMPLANT
GAUZE SPONGE 4X4 12PLY STRL (GAUZE/BANDAGES/DRESSINGS) ×2 IMPLANT
GAUZE XEROFORM 1X8 LF (GAUZE/BANDAGES/DRESSINGS) ×1 IMPLANT
GLOVE SURG ORTHO LTX SZ8 (GLOVE) ×2 IMPLANT
GLOVE SURG SYN 9.0  PF PI (GLOVE) ×1
GLOVE SURG SYN 9.0 PF PI (GLOVE) ×1 IMPLANT
GLOVE SURG UNDER LTX SZ8 (GLOVE) ×2 IMPLANT
GLOVE SURG UNDER POLY LF SZ9 (GLOVE) ×2 IMPLANT
GOWN SRG 2XL LVL 4 RGLN SLV (GOWNS) ×1 IMPLANT
GOWN STRL NON-REIN 2XL LVL4 (GOWNS) ×1
GOWN STRL REUS W/ TWL LRG LVL3 (GOWN DISPOSABLE) ×1 IMPLANT
GOWN STRL REUS W/ TWL XL LVL3 (GOWN DISPOSABLE) ×1 IMPLANT
GOWN STRL REUS W/TWL LRG LVL3 (GOWN DISPOSABLE) ×1
GOWN STRL REUS W/TWL XL LVL3 (GOWN DISPOSABLE) ×1
HOLDER FOLEY CATH W/STRAP (MISCELLANEOUS) ×2 IMPLANT
INSERT TIBIAL SZ2 LEFT (Insert) ×1 IMPLANT
IV NS IRRIG 3000ML ARTHROMATIC (IV SOLUTION) ×2 IMPLANT
KIT PREVENA INCISION MGT20CM45 (CANNISTER) ×2 IMPLANT
KIT TURNOVER KIT A (KITS) ×2 IMPLANT
MANIFOLD NEPTUNE II (INSTRUMENTS) ×2 IMPLANT
NDL SAFETY ECLIPSE 18X1.5 (NEEDLE) ×1 IMPLANT
NDL SPNL 18GX3.5 QUINCKE PK (NEEDLE) ×1 IMPLANT
NDL SPNL 20GX3.5 QUINCKE YW (NEEDLE) ×1 IMPLANT
NEEDLE HYPO 18GX1.5 SHARP (NEEDLE) ×1
NEEDLE SPNL 18GX3.5 QUINCKE PK (NEEDLE) IMPLANT
NEEDLE SPNL 20GX3.5 QUINCKE YW (NEEDLE) ×2 IMPLANT
NS IRRIG 500ML POUR BTL (IV SOLUTION) ×1 IMPLANT
PACK TOTAL KNEE (MISCELLANEOUS) ×2 IMPLANT
PAD WRAPON POLAR KNEE (MISCELLANEOUS) ×1 IMPLANT
PENCIL SMOKE EVACUATOR COATED (MISCELLANEOUS) ×2 IMPLANT
PULSAVAC PLUS IRRIG FAN TIP (DISPOSABLE) ×2
SCALPEL PROTECTED #10 DISP (BLADE) ×4 IMPLANT
SOLUTION PRONTOSAN WOUND 350ML (IRRIGATION / IRRIGATOR) IMPLANT
SPONGE T-LAP 18X18 ~~LOC~~+RFID (SPONGE) ×6 IMPLANT
STAPLER SKIN PROX 35W (STAPLE) ×2 IMPLANT
STEM EXTENSION 11MMX30MM (Stem) ×1 IMPLANT
SUCTION FRAZIER HANDLE 10FR (MISCELLANEOUS) ×1
SUCTION TUBE FRAZIER 10FR DISP (MISCELLANEOUS) ×1 IMPLANT
SUT DVC 2 QUILL PDO  T11 36X36 (SUTURE) ×1
SUT DVC 2 QUILL PDO T11 36X36 (SUTURE) ×1 IMPLANT
SUT ETHIBOND 2 V 37 (SUTURE) IMPLANT
SUT V-LOC 90 ABS DVC 3-0 CL (SUTURE) ×2 IMPLANT
SYR 20ML LL LF (SYRINGE) ×1 IMPLANT
SYR 50ML LL SCALE MARK (SYRINGE) ×4 IMPLANT
TIBIAL BONE MODEL LEFT (MISCELLANEOUS) ×1 IMPLANT
TIBIAL TRAY FIXED MEDACTA 0207 (Joint) ×1 IMPLANT
TIP FAN IRRIG PULSAVAC PLUS (DISPOSABLE) ×1 IMPLANT
TOWEL OR 17X26 4PK STRL BLUE (TOWEL DISPOSABLE) ×2 IMPLANT
TOWER CARTRIDGE SMART MIX (DISPOSABLE) ×2 IMPLANT
TRAY FOLEY MTR SLVR 16FR STAT (SET/KITS/TRAYS/PACK) ×2 IMPLANT
WATER STERILE IRR 1000ML POUR (IV SOLUTION) ×1 IMPLANT
WRAPON POLAR PAD KNEE (MISCELLANEOUS) ×2

## 2021-04-24 NOTE — H&P (Signed)
Chief Complaint  Patient presents with   Follow-up  H&P Left Total Knee Arthroplasty, 04/24/2021    History of the Present Illness: Judith [name] is a 70 y.o. female here today for history and physical for left total knee arthroplasty with Dr. Hessie Knows on 04/24/2021. Patient has been having pain for at least 8 months in her left knee. She is tried cortisone injections and viscosupplementation with no relief. She is tried bracing and physical therapy with no improvement. Her x-rays show complete loss of joint space in the medial compartment with moderate patellofemoral arthritic changes as well as some mild lateral compartment changes. Pain is severe located along the medial joint line. She has swelling and occasional giving away sensation. She feels catching and grinding. Her pain interferes with quality of life and activities daily living.  The patient is taking insulin. Her last A1c was 8.3 in 07/2020.   The patient is retired. The patient lives in [place], [place].   I have reviewed past medical, surgical, social and family history, and allergies as documented in the EMR.  Past Medical History: Past Medical History:  Diagnosis Date   Allergic state   Anxiety   Arthritis 2000   Asthma without status asthmaticus, unspecified 1965   Atypical chest pain   Cataract cortical, senile 01/2013  surgery on both eyes   CKD (chronic kidney disease) stage 3, Cr 1 and GFR 55 (02/06/20) 02/13/2020   Depression   Depression 1983   Diabetes mellitus type 2, uncomplicated (CMS-HCC)   Essential hypertension 06/17/2019   GERD (gastroesophageal reflux disease)   Hyperlipidemia   Migraine   Sleep apnea 2005  use cpap machine   Past Surgical History: Past Surgical History:  Procedure Laterality Date   BLADDER REPAIR   CATARACT EXTRACTION Left 02/08/13   CATARACT EXTRACTION 02/08/13   CHOLECYSTECTOMY   CHOLECYSTECTOMY 1995   COLONOSCOPY 01/20/2013  Normal Colon   EGD N/A 08/10/2012   Procedure: EGD; Surgeon: Lula Olszewski, MD; Location: [facility]; Service: Gastroenterology; Laterality: N/A;   EGD 04/01/2016  Gastritis: No repeat per RTE   HYSTERECTOMY  partial--prolapsed uterus   [place]   Past Family History: Family History  Problem Relation Age of Onset   Parkinsonism Mother   Bipolar disorder Sister   Depression Sister   Osteoarthritis Sister   Bipolar disorder Sister   Depression Sister   Osteoarthritis Sister   Ovarian cancer Sister   Coronary Artery Disease (Blocked arteries around heart) Brother  76s   Stroke Brother   Asthma Brother   Bipolar disorder Brother   Coronary Artery Disease (Blocked arteries around heart) Brother   Depression Brother   Asthma Brother   Bipolar disorder Brother   Depression Brother   Coronary Artery Disease (Blocked arteries around heart) Brother  Deceased   Stroke Brother  Deceased   Rheum arthritis Maternal Uncle   Osteoarthritis Maternal Uncle   Osteoarthritis Maternal Uncle  Deceased   Medications: Current Outpatient Medications Ordered in Epic  Medication Sig Dispense Refill   ARIPiprazole (ABILIFY) 2 MG tablet Take 2 mg by mouth once daily   blood glucose diagnostic (ONETOUCH ULTRA TEST) test strip Use 4 times daily- e11.65 400 strip 3   blood-glucose meter (ONETOUCH VERIO METER) Misc One touch verio meter 1 each 0   cetirizine (ZYRTEC) 10 MG tablet Take 10 mg by mouth once daily.   dulaglutide 4.5 mg/0.5 mL PnIj Inject 0.5 mLs (4.5 mg total) subcutaneously  every 7 (seven) days 2 mL 5   gabapentin (NEURONTIN) 100 MG capsule Take 2 capsules (200 mg total) by mouth at bedtime   glipiZIDE (GLUCOTROL XL) 10 MG XL tablet Take 2 tablets (20 mg total) by mouth once daily 180 tablet 3   LANTUS SOLOSTAR U-100 INSULIN pen injector (concentration 100 units/mL) Inject 28 Units subcutaneously nightly 15 mL 11   LINZESS 145 mcg capsule Take 1 capsule (145 mcg total)  by mouth once daily 90 capsule 3   metoprolol succinate (TOPROL-XL) 25 MG XL tablet Take 1 tablet (25 mg total) by mouth once daily 90 tablet 3   multivitamin tablet Take 1 tablet by mouth once daily   omeprazole (PRILOSEC) 40 MG DR capsule TAKE 1 CAPSULE(40 MG) BY MOUTH EVERY DAY 90 capsule 1   ONETOUCH ULTRA TEST test strip TEST BLOOD SUGAR THREE TIMES DAILY AS DIRECTED 100 strip 2   oxybutynin (DITROPAN-XL) 5 MG XL tablet TAKE 1 TABLET(5 MG) BY MOUTH EVERY DAY 30 tablet 3   pen needle, diabetic 31 gauge x 5/16" needle Use as directed 100 each 12   pioglitazone (ACTOS) 15 MG tablet Take 1 tablet (15 mg total) by mouth once daily 30 tablet 11   venlafaxine (EFFEXOR-XR) 150 MG XR capsule TAKE 1 CAPSULE(150 MG) BY MOUTH EVERY DAY 90 capsule 1   venlafaxine (EFFEXOR-XR) 75 MG XR capsule TAKE 1 CAPSULE BY MOUTH EVERY DAY IN ADDITION TO 1 CAPSULE OF 150 MG DAILY 30 capsule 3   zonisamide (ZONEGRAN) 100 MG capsule TAKE 1 CAPSULE(100 MG) BY MOUTH EVERY DAY 30 capsule 2   No current Epic-ordered facility-administered medications on file.   Allergies: Allergies  Allergen Reactions   Metformin Nausea   Pravastatin Muscle Pain  Tried 10mg  and 20 mg   Topiramate Dizziness    Body mass index is 39.43 kg/m.  Review of Systems: A comprehensive 14 point ROS was performed, reviewed, and the pertinent orthopaedic findings are documented in the HPI.  Vitals:  04/10/21 1400  BP: 130/78    General Physical Examination:   General:  Well developed, well nourished, no apparent distress, normal affect, antalgic gait  HEENT: Head normocephalic, atraumatic, PERRL.   Abdomen: Soft, non tender, non distended, Bowel sounds present.  Heart: Examination of the heart reveals regular, rate, and rhythm. There is no murmur noted on ascultation. There is a normal apical pulse.  Lungs: Lungs are clear to auscultation. There is no wheeze, rhonchi, or crackles. There is normal expansion of bilateral chest  walls.   Left knee: On exam, left knee has range of motion of 5 to 105 degrees. Left hip has 45 degrees internal rotation.  Radiographs:  X-rays of the left knee reviewed by me today from 11/11/2020 show lateral tracking of the patella in the trochlear groove with sclerotic changes throughout the articular surface of the patella with spurring noted. 95% loss of joint space in the medial compartment with sclerotic changes along the medial tibial plateau. Mild joint space narrowing noted in the lateral compartment with sclerotic changes along the lateral tibial plateau. No evidence of acute bony abnormality.  Assessment: ICD-10-CM  1. Primary osteoarthritis of left knee M17.12   Plan:  10. 70 year old female with advanced left knee osteoarthritis. No relief with conservative treatment over the last 8 months. Pain interfering with quality of life and activities daily living. Risks, benefits, complications of a left total knee arthroplasty have been discussed with the patient. Patient has agreed and consented procedure with Dr.  Kennedy Bucker on 04/24/2021.  Electronically signed by Patience Musca, PA at 04/10/2021 2:23 PM EST  Reviewed  H+P. No changes noted.

## 2021-04-24 NOTE — Transfer of Care (Signed)
Immediate Anesthesia Transfer of Care Note  Patient: Elita Dame  Procedure(s) Performed: TOTAL KNEE ARTHROPLASTY (Left: Knee)  Patient Location: PACU  Anesthesia Type:Spinal  Level of Consciousness: awake, alert  and oriented  Airway & Oxygen Therapy: Patient Spontanous Breathing and Patient connected to face mask oxygen  Post-op Assessment: Report given to RN and Post -op Vital signs reviewed and stable  Post vital signs: Reviewed and stable  Last Vitals:  Vitals Value Taken Time  BP 84/59 04/24/21 0918  Temp    Pulse 106 04/24/21 0919  Resp 14 04/24/21 0919  SpO2 98 % 04/24/21 0919  Vitals shown include unvalidated device data.  Last Pain:  Vitals:   04/24/21 0617  TempSrc: Tympanic         Complications: No notable events documented.

## 2021-04-24 NOTE — Progress Notes (Signed)
Patient admitted from PACU s/p Total Knee Arthroplasty with Dr. Rosita Kea. Bedside report received from [place], [place]. VSS. Dressing to Left knee with wound vac therapy intact, polar care in place. Foley in place. Admission assessment completed. Bed to lowest position, call bell and needs within reach. Will continue to monitor and assess with plan of care.

## 2021-04-24 NOTE — Plan of Care (Signed)
°  Problem: Education: Goal: Knowledge of General Education information will improve Description: Including pain rating scale, medication(s)/side effects and non-pharmacologic comfort measures Outcome: Progressing   Problem: Health Behavior/Discharge Planning: Goal: Ability to manage health-related needs will improve Outcome: Progressing   Problem: Clinical Measurements: Goal: Ability to maintain clinical measurements within normal limits will improve Outcome: Progressing Goal: Will remain free from infection Outcome: Progressing   Problem: Education: Goal: Knowledge of the prescribed therapeutic regimen will improve Outcome: Progressing Goal: Individualized Educational Video(s) Outcome: Progressing   Problem: Activity: Goal: Ability to avoid complications of mobility impairment will improve Outcome: Progressing Goal: Range of joint motion will improve Outcome: Progressing   Problem: Clinical Measurements: Goal: Postoperative complications will be avoided or minimized Outcome: Progressing   Problem: Pain Management: Goal: Pain level will decrease with appropriate interventions Outcome: Progressing   Problem: Skin Integrity: Goal: Will show signs of wound healing Outcome: Progressing

## 2021-04-24 NOTE — Anesthesia Postprocedure Evaluation (Signed)
Anesthesia Post Note  Patient: Judith [name]  Procedure(s) Performed: TOTAL KNEE ARTHROPLASTY (Left: Knee)  Patient location during evaluation: PACU Anesthesia Type: Spinal Level of consciousness: oriented and awake and alert Pain management: pain level controlled Vital Signs Assessment: post-procedure vital signs reviewed and stable Respiratory status: spontaneous breathing, respiratory function stable and patient connected to nasal cannula oxygen Cardiovascular status: blood pressure returned to baseline and stable Postop Assessment: no headache, no backache and no apparent nausea or vomiting Anesthetic complications: no   No notable events documented.   Last Vitals:  Vitals:   04/24/21 1045 04/24/21 1100  BP: (!) 99/49 (!) 104/57  Pulse: 95 96  Resp: 13 14  Temp:  37.2 C  SpO2: 97% 98%    Last Pain:  Vitals:   04/24/21 1100  TempSrc:   PainSc: 0-No pain    LLE Motor Response: Responds to commands (04/24/21 1100) LLE Sensation: No pain (04/24/21 1100) RLE Motor Response: Responds to commands (04/24/21 1100) RLE Sensation: No pain (04/24/21 1100)      Corinda Gubler

## 2021-04-24 NOTE — Anesthesia Procedure Notes (Signed)
Spinal  Patient location during procedure: OR Start time: 04/24/2021 7:20 AM End time: 04/24/2021 7:24 AM Reason for block: surgical anesthesia Staffing Performed: resident/CRNA  Anesthesiologist: Piscitello, Precious Haws, MD Resident/CRNA: Nelda Marseille, CRNA Preanesthetic Checklist Completed: patient identified, IV checked, site marked, risks and benefits discussed, surgical consent, monitors and equipment checked, pre-op evaluation and timeout performed Spinal Block Patient position: sitting Prep: ChloraPrep Patient monitoring: heart rate, continuous pulse ox, blood pressure and cardiac monitor Approach: midline Location: L3-4 Injection technique: single-shot Needle Needle type: Quincke  Needle gauge: 22 G Needle length: 9 cm Assessment Sensory level: T10 Events: CSF return Additional Notes Negative paresthesia. Negative blood return. Positive free-flowing CSF. Expiration date of kit checked and confirmed. Patient tolerated procedure well, without complications.

## 2021-04-24 NOTE — Anesthesia Preprocedure Evaluation (Signed)
Anesthesia Evaluation  Patient identified by MRN, date of birth, ID band Patient awake    Reviewed: Allergy & Precautions, H&P , NPO status , Patient's Chart, lab work & pertinent test results, reviewed documented beta blocker date and time   History of Anesthesia Complications Negative for: history of anesthetic complications  Airway Mallampati: III  TM Distance: >3 FB Neck ROM: full    Dental no notable dental hx. (+) Teeth Intact   Pulmonary neg shortness of breath, asthma , sleep apnea and Continuous Positive Airway Pressure Ventilation , neg COPD, neg recent URI, Patient abstained from smoking.Not current smoker,  Well controlled asthma, hasn't needed inhalers for years   Pulmonary exam normal breath sounds clear to auscultation       Cardiovascular Exercise Tolerance: Good METS(-) hypertension(-) CAD and (-) Past MI Normal cardiovascular exam(-) dysrhythmias  Rhythm:regular Rate:Normal - Systolic murmurs Hx palpitations   Neuro/Psych  Headaches, PSYCHIATRIC DISORDERS (Depression) Depression Schizophrenia    GI/Hepatic Neg liver ROS, GERD  Medicated and Controlled,  Endo/Other  diabetes, Well Controlled, Type 2, Insulin Dependent  Renal/GU CRFRenal disease  negative genitourinary   Musculoskeletal  (+) Arthritis , Fibromyalgia -  Abdominal   Peds  Hematology negative hematology ROS (+)   Anesthesia Other Findings Past Medical History: No date: Anginal pain (HCC) No date: Arthritis No date: Asthma No date: Depression No date: Diabetes mellitus without complication (HCC) No date: GERD (gastroesophageal reflux disease) No date: Headache No date: Sleep apnea   Reproductive/Obstetrics negative OB ROS                             Anesthesia Physical  Anesthesia Plan  ASA: 3  Anesthesia Plan: Spinal   Post-op Pain Management: Ofirmev IV (intra-op)   Induction: Intravenous  PONV  Risk Score and Plan: 2 and Ondansetron, Dexamethasone, Propofol infusion, TIVA, Midazolam and Treatment may vary due to age or medical condition  Airway Management Planned: Natural Airway  Additional Equipment: None  Intra-op Plan:   Post-operative Plan:   Informed Consent: I have reviewed the patients History and Physical, chart, labs and discussed the procedure including the risks, benefits and alternatives for the proposed anesthesia with the patient or authorized representative who has indicated his/her understanding and acceptance.     Dental Advisory Given  Plan Discussed with: Anesthesiologist, CRNA and Surgeon  Anesthesia Plan Comments: (Discussed R/B/A of neuraxial anesthesia technique with patient: - rare risks of spinal/epidural hematoma, nerve damage, infection - Risk of PDPH - Risk of nausea and vomiting - Risk of conversion to general anesthesia and its associated risks, including sore throat, damage to lips/eyes/teeth/oropharynx, and rare risks such as cardiac and respiratory events. - Risk of allergic reactions  Discussed the role of CRNA in patient's perioperative care.  Patient voiced understanding.)        Anesthesia Quick Evaluation

## 2021-04-24 NOTE — Evaluation (Signed)
Physical Therapy Evaluation Patient Details Name: Judith [name] MRN: [id number] DOB: May 18, 1950 Today's Date: 04/24/2021  History of Present Illness  70 y/o female s/p L TKA. PMH includes DM type 2, CKD stage 3, anxiety and depression.  Clinical Impression  Pt received supine in bed reporting pain. RN notified and session delayed for administration of pain meds. Upon second arrival, pt reports 3/10 pain in L knee at rest. Pt lives with her sister in a single level home, 1 STE. She does have an elevated bed at home and has to navigate 2 steps in order to get into/out of bed. She was generally independent prior to surgery, only requiring assist with IADL's due to knee pain.   Pt performed bed mobility with MIN A (HHA to pull trunk to upright), STS and 30' ambulation with CGA using RW. Ambulation limited by pain. Pt did perform a straight leg lift (x3 reps) prior to standing. As PT set up recliner, pt performed seated therex in TKA packet. Will review supine therex in next session.  Pt remained in recliner at end of session, heel propped to encourage knee extension and polar ice reapplied. Pt presents with deficits strength, ROM, mobility and stability related to L TKA. Rec HHPT upon d/c. Would benefit from skilled PT to address above deficits and promote optimal return to PLOF.      Recommendations for follow up therapy are one component of a multi-disciplinary discharge planning process, led by the attending physician.  Recommendations may be updated based on patient status, additional functional criteria and insurance authorization.  Follow Up Recommendations Home health PT    Assistance Recommended at Discharge Intermittent Supervision/Assistance  Functional Status Assessment Patient has had a recent decline in their functional status and demonstrates the ability to make significant improvements in function in a reasonable and predictable amount of time.  Equipment Recommendations  Rolling  walker (2 wheels);BSC/3in1    Recommendations for Other Services       Precautions / Restrictions Precautions Precautions: Fall Restrictions Weight Bearing Restrictions: Yes LLE Weight Bearing: Weight bearing as tolerated      Mobility  Bed Mobility Overal bed mobility: Needs Assistance Bed Mobility: Supine to Sit     Supine to sit: Min assist     General bed mobility comments: Performed SUP until final trunk management where pt reached out for HHA to pull trunk to upright    Transfers Overall transfer level: Needs assistance Equipment used: Rolling walker (2 wheels) Transfers: Sit to/from Stand;Bed to chair/wheelchair/BSC Sit to Stand: Min guard Stand pivot transfers: Min guard         General transfer comment: CGA for safety. No steadying required. Stand pivot performed after ambulating to end session in recliner.    Ambulation/Gait Ambulation/Gait assistance: Min guard Gait Distance (Feet): 30 Feet Assistive device: Rolling walker (2 wheels) Gait Pattern/deviations: Step-through pattern;Decreased step length - right;Decreased stance time - left;Decreased weight shift to left Gait velocity: decreased     General Gait Details: UE support to decrease weightshift onto LLE. Decreased stride length however pt does ambulate with step-through pattern.  Stairs            Wheelchair Mobility    Modified Rankin (Stroke Patients Only)       Balance Overall balance assessment: Needs assistance Sitting-balance support: No upper extremity supported;Feet supported Sitting balance-Leahy Scale: Normal     Standing balance support: Bilateral upper extremity supported;During functional activity;Reliant on assistive device for balance Standing balance-Leahy Scale: Poor Standing balance comment:  reliant on RW during ambulation.                             Pertinent Vitals/Pain Pain Assessment: 0-10 Pain Score: 3  Pain Location: L knee Pain  Descriptors / Indicators: Aching;Discomfort Pain Intervention(s): Limited activity within patient's tolerance;Monitored during session;Premedicated before session;Repositioned;Ice applied    Home Living Family/patient expects to be discharged to:: Private residence Living Arrangements: Other relatives (sister) Available Help at Discharge: Family;Available 24 hours/day Type of Home: House Home Access: Stairs to enter Entrance Stairs-Rails: None Entrance Stairs-Number of Steps: 1   Home Layout: One level Home Equipment: Cane - single point;Grab bars - tub/shower;Shower seat Additional Comments: was not using AD prior to surgery    Prior Function Prior Level of Function : Independent/Modified Independent             Mobility Comments: Independent with ambulation and transfers. Recently household distances only due to knee pain. ADLs Comments: Independent with ADL's; recently has required assist with IADL's such as cooking and cleaning due to knee pain     Hand Dominance        Extremity/Trunk Assessment   Upper Extremity Assessment Upper Extremity Assessment: Overall WFL for tasks assessed    Lower Extremity Assessment Lower Extremity Assessment: LLE deficits/detail LLE Deficits / Details: s/p L TKA LLE: Unable to fully assess due to pain       Communication   Communication: No difficulties  Cognition Arousal/Alertness: Awake/alert Behavior During Therapy: WFL for tasks assessed/performed Overall Cognitive Status: Within Functional Limits for tasks assessed                                 General Comments: A&Ox4        General Comments      Exercises Total Joint Exercises Ankle Circles/Pumps: AROM;Left;15 reps Long Arc Quad: Strengthening;AROM;Left;10 reps;Seated Knee Flexion: AROM;Strengthening;Left;10 reps;Seated   Assessment/Plan    PT Assessment Patient needs continued PT services  PT Problem List Decreased strength;Decreased  mobility;Decreased safety awareness;Decreased range of motion;Decreased activity tolerance;Decreased balance       PT Treatment Interventions DME instruction;Therapeutic activities;Gait training;Therapeutic exercise;Patient/family education;Stair training;Balance training;Functional mobility training;Neuromuscular re-education    PT Goals (Current goals can be found in the Care Plan section)  Acute Rehab PT Goals Patient Stated Goal: to walk without AD PT Goal Formulation: With patient Time For Goal Achievement: 05/08/21 Potential to Achieve Goals: Good    Frequency BID   Barriers to discharge        Co-evaluation               AM-PAC PT "6 Clicks" Mobility  Outcome Measure Help needed turning from your back to your side while in a flat bed without using bedrails?: A Little Help needed moving from lying on your back to sitting on the side of a flat bed without using bedrails?: A Little Help needed moving to and from a bed to a chair (including a wheelchair)?: A Little Help needed standing up from a chair using your arms (e.g., wheelchair or bedside chair)?: A Little Help needed to walk in hospital room?: A Little Help needed climbing 3-5 steps with a railing? : A Little 6 Click Score: 18    End of Session Equipment Utilized During Treatment: Gait belt Activity Tolerance: Patient tolerated treatment well;Patient limited by pain Patient left: in chair;with call bell/phone within reach;with  chair alarm set;with SCD's reapplied Nurse Communication: Mobility status;Weight bearing status;Precautions PT Visit Diagnosis: Unsteadiness on feet (R26.81);Other abnormalities of gait and mobility (R26.89);Muscle weakness (generalized) (M62.81);Difficulty in walking, not elsewhere classified (R26.2)    Time: [id number] PT Time Calculation (min) (ACUTE ONLY): 32 min   Charges:   PT Evaluation $PT Eval Low Complexity: 1 Low PT Treatments $Gait Training: 8-22 mins $Therapeutic  Exercise: 8-22 mins        Patrina Levering PT, DPT 04/24/21 4:48 PM [phone]

## 2021-04-24 NOTE — Progress Notes (Signed)
Met with the patient and her sister at the bedside to discuss DC plan and needs She lives at home with her Sister She does not have DME at home and will need a RW and a 3 in 1 Will be delivered to the room prior to DC by [employer] She is set up with [facility] for [facility] services She has transportation and can afford her medications No additional needs at this time

## 2021-04-24 NOTE — Op Note (Signed)
04/24/2021  9:25 AM  PATIENT:  Judith [name]   MRN: [id number]  PRE-OPERATIVE DIAGNOSIS:  Primary localized osteoarthritis of left knee   POST-OPERATIVE DIAGNOSIS:  Same   PROCEDURE:  Procedure(s): Left TOTAL KNEE ARTHROPLASTY   SURGEON: Leitha Schuller, MD   ASSISTANTS: Cranston Neighbor, PA-C   ANESTHESIA:   spinal   EBL: 150   BLOOD ADMINISTERED:none   DRAINS:  Incisional wound VAC     LOCAL MEDICATIONS USED:  MARCAINE    and OTHER morphine and Exparel   SPECIMEN:  No Specimen   DISPOSITION OF SPECIMEN:  N/A   COUNTS:  YES   TOURNIQUET: 56 minutes at 300 mm Hg   IMPLANTS: Medacta  GMK sphere system with 2+ left femur, 2 left tibia with short stem and 12 mm insert.  Size 1 patella, all components cemented.   DICTATION: Reubin Milan Dictation   patient was brought to the operating room and spinal anesthesia was obtained.  After prepping and draping the left leg in sterile fashion, and after patient identification and timeout procedures were completed, tourniquet was raised  and midline skin incision was made followed by medial parapatellar arthrotomy with severe medial compartment osteoarthritis, severe patellofemoral arthritis and mild lateral compartment arthritis, partial synovectomy was also carried out.   The ACL and PCL and fat pad were excised along with anterior horns of the meniscus. The proximal tibia cutting guide from  the [facility] system was applied and the proximal tibia cut carried out.  The distal femoral cut was carried out in a similar fashion     The 2+ femoral cutting guide applied with anterior posterior and chamfer cuts made.  The posterior horns of the menisci were removed at this point.   Injection of the above medication was carried out after the femoral and tibial cuts were carried out.  The 2 baseplate trial was placed pinned into position and proximal tibial preparation carried out with drilling hand reaming and the keel punch followed by placement of the 2+  femur and sizing the tibial insert size 12 millimeter gave the best fit with stability and full extension.  The distal femoral drill holes were made in the notch cut for the trochlear groove was then carried out with trials were then removed the patella was cut using the patellar cutting guide and it sized to a size 1 after drill holes have been made  The knee was irrigated with pulsatile lavage and the bony surfaces dried the tibial component was cemented into place first.  Excess cement was removed and the polyethylene insert placed with a torque screw placed with a torque screwdriver tightened.  The distal femoral component was placed and the knee was held in extension as the patellar button was clamped into place.  After the cement was set, excess cement was removed and the knee was again irrigated thoroughly thoroughly irrigated.  The tourniquet was let down and hemostasis checked with electrocautery. The arthrotomy was repaired with a heavy Quill suture,  followed by 3-0 V lock subcuticular closure, skin staples followed by incisional wound VAC and Polar Care.Marland Kitchen   PLAN OF CARE: Admit for overnight observation   PATIENT DISPOSITION:  PACU - hemodynamically stable.

## 2021-04-24 NOTE — Anesthesia Procedure Notes (Signed)
Date/Time: 04/24/2021 7:20 AM Performed by: Junious Silk, CRNA Oxygen Delivery Method: Simple face mask

## 2021-04-25 DIAGNOSIS — M1712 Unilateral primary osteoarthritis, left knee: Secondary | ICD-10-CM | POA: Diagnosis not present

## 2021-04-25 LAB — BASIC METABOLIC PANEL
Anion gap: 9 (ref 5–15)
BUN: 10 mg/dL (ref 8–23)
CO2: 21 mmol/L — ABNORMAL LOW (ref 22–32)
Calcium: 8.3 mg/dL — ABNORMAL LOW (ref 8.9–10.3)
Chloride: 97 mmol/L — ABNORMAL LOW (ref 98–111)
Creatinine, Ser: 0.65 mg/dL (ref 0.44–1.00)
GFR, Estimated: >60 mL/min (ref >60)
Glucose, Bld: 209 mg/dL — ABNORMAL HIGH (ref 70–99)
Potassium: 3.9 mmol/L (ref 3.5–5.1)
Sodium: 127 mmol/L — ABNORMAL LOW (ref 135–145)

## 2021-04-25 LAB — GLUCOSE, CAPILLARY
Glucose-Capillary: 207 mg/dL — ABNORMAL HIGH (ref 70–99)
Glucose-Capillary: 203 mg/dL — ABNORMAL HIGH (ref 70–99)
Glucose-Capillary: 213 mg/dL — ABNORMAL HIGH (ref 70–99)
Glucose-Capillary: 253 mg/dL — ABNORMAL HIGH (ref 70–99)

## 2021-04-25 LAB — CBC
HCT: 32.7 % — ABNORMAL LOW (ref 36.0–46.0)
Hemoglobin: 11.1 g/dL — ABNORMAL LOW (ref 12.0–15.0)
MCH: 30.1 pg (ref 26.0–34.0)
MCHC: 33.9 g/dL (ref 30.0–36.0)
MCV: 88.6 fL (ref 80.0–100.0)
Platelets: 278 10*3/uL (ref 150–400)
RBC: 3.69 MIL/uL — ABNORMAL LOW (ref 3.87–5.11)
RDW: 12.4 % (ref 11.5–15.5)
WBC: 16 10*3/uL — ABNORMAL HIGH (ref 4.0–10.5)
nRBC: 0 % (ref 0.0–0.2)

## 2021-04-25 MED ORDER — HYDROCODONE-ACETAMINOPHEN 5-325 MG PO TABS
1.0000 | ORAL_TABLET | ORAL | 0 refills | Status: DC | PRN
Start: 2021-04-25 — End: 2021-12-15

## 2021-04-25 MED ORDER — METHOCARBAMOL 500 MG PO TABS: 500.0000 mg | ORAL_TABLET | Freq: Four times a day (QID) | ORAL | 0 refills | Status: DC | PRN

## 2021-04-25 MED ORDER — ENOXAPARIN SODIUM 40 MG/0.4ML IJ SOSY: 40.0000 mg | PREFILLED_SYRINGE | INTRAMUSCULAR | 0 refills | Status: DC

## 2021-04-25 MED ORDER — TRAMADOL HCL 50 MG PO TABS: 50.0000 mg | ORAL_TABLET | Freq: Four times a day (QID) | ORAL | 0 refills | Status: DC | PRN

## 2021-04-25 MED ORDER — POLYETHYLENE GLYCOL 3350 17 G PO PACK: 17.0000 g | PACK | Freq: Every day | ORAL | 0 refills | Status: DC | PRN

## 2021-04-25 MED ORDER — DOCUSATE SODIUM 100 MG PO CAPS: 100.0000 mg | ORAL_CAPSULE | Freq: Two times a day (BID) | ORAL | 0 refills | Status: DC

## 2021-04-25 NOTE — Progress Notes (Signed)
° °  Subjective: 1 Day Post-Op Procedure(s) (LRB): TOTAL KNEE ARTHROPLASTY (Left) Patient reports pain as 8 on 0-10 scale.   Patient is well, and has had no acute complaints or problems Denies any CP, SOB, ABD pain. We will continue therapy today.  Plan is to go Home after hospital stay.  Objective: Vital signs in last 24 hours: Temp:  [97.7 F (36.5 C)-99.1 F (37.3 C)] 99.1 F (37.3 C) (12/30 0745) Pulse Rate:  [92-108] 100 (12/30 0745) Resp:  [13-20] 18 (12/30 0745) BP: (92-161)/(49-87) 139/83 (12/30 0745) SpO2:  [97 %-100 %] 99 % (12/30 0745)  Intake/Output from previous day: 12/29 0701 - 12/30 0700 In: 3160 [P.O.:960; I.V.:2000; IV Piggyback:200] Out: 2900 [Urine:2850; Blood:50] Intake/Output this shift: No intake/output data recorded.  Recent Labs    04/24/21 1259  HGB 10.5*   Recent Labs    04/24/21 1259  WBC 12.5*  RBC 3.37*  HCT 31.6*  PLT 228   Recent Labs    04/24/21 1259  CREATININE 0.81   No results for input(s): LABPT, INR in the last 72 hours.  EXAM General - Patient is Alert and Appropriate Extremity - Neurovascular intact Sensation intact distally Intact pulses distally Dorsiflexion/Plantar flexion intact Dressing - dressing C/D/I, provena intact with out drainage Motor Function - intact, moving foot and toes well on exam.   Past Medical History:  Diagnosis Date   Anginal pain (HCC)    Arthritis    Asthma    Depression    Diabetes mellitus without complication (HCC)    Dysrhythmia    Fibromyalgia    Gastroparesis    GERD (gastroesophageal reflux disease)    Headache    Sleep apnea     Assessment/Plan:   1 Day Post-Op Procedure(s) (LRB): TOTAL KNEE ARTHROPLASTY (Left) Principal Problem:   S/P TKR (total knee replacement) using cement, left  Estimated body mass index is 39.47 kg/m as calculated from the following:   Height as of 04/14/21: 5\' 2"  (1.575 m).   Weight as of 04/14/21: 97.9 kg. Advance diet Up with  therapy Labs and VSS Pain well controlled CM to assist with discharge to home with HHPT DVT Prophylaxis - Lovenox, TED hose, and SCDs Weight-Bearing as tolerated to left leg   T. 04/16/21, PA-C [facility] 04/25/2021, 7:51 AM

## 2021-04-25 NOTE — Evaluation (Signed)
Occupational Therapy Evaluation Patient Details Name: Judith [name] MRN: [id number] DOB: 04-09-1951 Today's Date: 04/25/2021   History of Present Illness 70 y/o female s/p L TKA. PMH includes DM type 2, CKD stage 3, anxiety and depression.   Clinical Impression   Patient presenting with decreased Ind in self care, balance, functional mobility/transfers, endurance, and safety awareness. Patient reports living at home with sister and being independent at baseline. Patient currently functioning at supervision- min guard with use of RW for mobility. Pt needing min A for LB self care with education on use of AE to increase independence with self care. OT educating pt on polar care system as well with paper handout provided. Patient will benefit from acute OT to increase overall independence in the areas of ADLs, functional mobility, and safety awareness in order to safely discharge home with family.     Recommendations for follow up therapy are one component of a multi-disciplinary discharge planning process, led by the attending physician.  Recommendations may be updated based on patient status, additional functional criteria and insurance authorization.   Follow Up Recommendations  No OT follow up    Assistance Recommended at Discharge Intermittent Supervision/Assistance  Functional Status Assessment  Patient has had a recent decline in their functional status and demonstrates the ability to make significant improvements in function in a reasonable and predictable amount of time.  Equipment Recommendations  None recommended by OT       Precautions / Restrictions Precautions Precautions: Fall Restrictions Weight Bearing Restrictions: Yes LLE Weight Bearing: Weight bearing as tolerated      Mobility Bed Mobility Overal bed mobility: Needs Assistance Bed Mobility: Supine to Sit;Sit to Supine     Supine to sit: Supervision;HOB elevated Sit to supine: Supervision   General bed  mobility comments: using bed rails and increased time    Transfers Overall transfer level: Needs assistance Equipment used: Rolling walker (2 wheels) Transfers: Sit to/from Stand Sit to Stand: Min guard Stand pivot transfers: Min guard         General transfer comment: CGA for safety and light steadying upon inital weight placement through LLE      Balance Overall balance assessment: Needs assistance Sitting-balance support: No upper extremity supported;Feet supported Sitting balance-Leahy Scale: Normal     Standing balance support: Bilateral upper extremity supported;During functional activity;Reliant on assistive device for balance Standing balance-Leahy Scale: Poor Standing balance comment: reliant on RW during standing and ambulation.                           ADL either performed or assessed with clinical judgement   ADL Overall ADL's : Needs assistance/impaired     Grooming: Wash/dry hands;Wash/dry face;Sitting;Supervision/safety;Set up               Lower Body Dressing: Minimal assistance               Functional mobility during ADLs: Min guard;Rolling walker (2 wheels)       Vision Patient Visual Report: No change from baseline              Pertinent Vitals/Pain Pain Assessment: 0-10 Pain Score: 3  Pain Location: L knee Pain Descriptors / Indicators: Aching;Discomfort Pain Intervention(s): Limited activity within patient's tolerance;Monitored during session;Repositioned;Ice applied     Hand Dominance Right   Extremity/Trunk Assessment Upper Extremity Assessment Upper Extremity Assessment: Overall WFL for tasks assessed   Lower Extremity Assessment Lower Extremity Assessment: Defer to PT  evaluation       Communication Communication Communication: No difficulties   Cognition Arousal/Alertness: Awake/alert Behavior During Therapy: WFL for tasks assessed/performed Overall Cognitive Status: Within Functional Limits for  tasks assessed                                 General Comments: A&Ox4        Exercises Total Joint Exercises Ankle Circles/Pumps: AROM;Both;20 reps;Supine Quad Sets: AROM;Strengthening;Left;15 reps;Supine Short Arc Quad: AAROM;Strengthening;Left;15 reps;Supine Heel Slides: AAROM;Strengthening;Left;15 reps;Supine Hip ABduction/ADduction: AAROM;Strengthening;Left;15 reps;Supine Straight Leg Raises: AAROM;Strengthening;Left;15 reps;Supine Long Arc Quad: AROM;Strengthening;Left;15 reps;Seated Knee Flexion: AROM;Strengthening;Left;Seated;15 reps        Home Living Family/patient expects to be discharged to:: Private residence Living Arrangements: Other relatives (sister) Available Help at Discharge: Family;Available 24 hours/day Type of Home: House Home Access: Stairs to enter [employer] of Steps: 1 Entrance Stairs-Rails: None Home Layout: One level     Bathroom Shower/Tub: Walk-in shower;Tub/shower unit   Bathroom Toilet: Standard     Home Equipment: Cane - single point;Grab bars - tub/shower;Shower seat   Additional Comments: was not using AD prior to surgery      Prior Functioning/Environment Prior Level of Function : Independent/Modified Independent             Mobility Comments: Independent with ambulation and transfers. Recently household distances only due to knee pain. ADLs Comments: Independent with ADL's; recently has required assist with IADL's such as cooking and cleaning due to knee pain        OT Problem List: Decreased strength;Decreased range of motion;Decreased activity tolerance;Impaired balance (sitting and/or standing);Decreased safety awareness;Pain;Decreased knowledge of use of DME or AE      OT Treatment/Interventions: Self-care/ADL training;Manual therapy;Therapeutic exercise;Patient/family education;Balance training;Energy conservation;Therapeutic activities;DME and/or AE instruction    OT Goals(Current goals  can be found in the care plan section) Acute Rehab OT Goals Patient Stated Goal: to go home OT Goal Formulation: With patient Time For Goal Achievement: 05/09/21 Potential to Achieve Goals: Good ADL Goals Pt Will Perform Grooming: with modified independence;standing Pt Will Perform Lower Body Dressing: with modified independence;sit to/from stand Pt Will Transfer to Toilet: with modified independence;ambulating Pt Will Perform Toileting - Clothing Manipulation and hygiene: with modified independence;sit to/from stand  OT Frequency: Min 2X/week   Barriers to D/C:    none known at this time          AM-PAC OT "6 Clicks" Daily Activity     Outcome Measure Help from another person eating meals?: None Help from another person taking care of personal grooming?: None Help from another person toileting, which includes using toliet, bedpan, or urinal?: A Little Help from another person bathing (including washing, rinsing, drying)?: A Little Help from another person to put on and taking off regular upper body clothing?: None Help from another person to put on and taking off regular lower body clothing?: A Little 6 Click Score: 21   End of Session Equipment Utilized During Treatment: Rolling walker (2 wheels) Nurse Communication: Mobility status  Activity Tolerance: Patient tolerated treatment well Patient left: in bed;with call bell/phone within reach;with bed alarm set  OT Visit Diagnosis: Unsteadiness on feet (R26.81);Muscle weakness (generalized) (M62.81)                Time: 2549-8264 OT Time Calculation (min): 16 min Charges:  OT General Charges $OT Visit: 1 Visit OT Evaluation $OT Eval Low Complexity: 1 Low OT Treatments $Self  Care/Home Management : 8-22 mins  Jackquline Denmark, MS, OTR/L , CBIS ascom [phone]  04/25/21, 12:54 PM

## 2021-04-25 NOTE — Progress Notes (Signed)
Physical Therapy Treatment Patient Details Name: Judith [name] MRN: [id number] DOB: [birth year] Today's Date: 04/25/2021   History of Present Illness 70 y/o female s/p L TKA. PMH includes DM type 2, CKD stage 3, anxiety and depression.    PT Comments    Pt received supine in bed, agreeable to therapy. Session initiated with supine exercises in packet. Active assist provided for most of the exercises for increased range. Pt then navigated 1 step using RW and CGA to steady and assist with lift of walker. After ascending/descending x1 rep, PT offered a second to ensure pt felt safe and confident going home - although pt reports she did not feel "comfortable" she declined second rep. Will offer at later session. Pt stating she is fatigued and would like to return to bed. Longer distance ambulation to be practiced this afternoon as well. Would benefit from skilled PT to address above deficits and promote optimal return to PLOF.    Recommendations for follow up therapy are one component of a multi-disciplinary discharge planning process, led by the attending physician.  Recommendations may be updated based on patient status, additional functional criteria and insurance authorization.  Follow Up Recommendations  Home health PT     Assistance Recommended at Discharge Intermittent Supervision/Assistance  Equipment Recommendations  Rolling walker (2 wheels);BSC/3in1    Recommendations for Other Services       Precautions / Restrictions Precautions Precautions: Fall Restrictions Weight Bearing Restrictions: Yes LLE Weight Bearing: Weight bearing as tolerated     Mobility  Bed Mobility Overal bed mobility: Needs Assistance Bed Mobility: Supine to Sit;Sit to Supine     Supine to sit: Supervision;HOB elevated Sit to supine: Supervision   General bed mobility comments: using bed rails and increased time    Transfers Overall transfer level: Needs assistance Equipment used: Rolling  walker (2 wheels) Transfers: Sit to/from Stand Sit to Stand: Min guard           General transfer comment: CGA for safety and light steadying upon inital weight placement through LLE    Ambulation/Gait Ambulation/Gait assistance: Min guard Gait Distance (Feet): 10 Feet Assistive device: Rolling walker (2 wheels) Gait Pattern/deviations: Decreased stance time - left;Decreased weight shift to left;Step-to pattern       General Gait Details: UE support to decrease weightshift onto LLE. Decreased WB through LLE compared to previous session   Stairs Stairs: Yes Stairs assistance: Min guard Stair Management: No rails;With walker;Forwards Number of Stairs: 1 General stair comments: 1 step (curb) with CGA to steady. PT assisted with lifting RW due to weight of wound vac.   Wheelchair Mobility    Modified Rankin (Stroke Patients Only)       Balance Overall balance assessment: Needs assistance Sitting-balance support: No upper extremity supported;Feet supported Sitting balance-Leahy Scale: Normal     Standing balance support: Bilateral upper extremity supported;During functional activity;Reliant on assistive device for balance Standing balance-Leahy Scale: Poor Standing balance comment: reliant on RW during standing and ambulation.                            Cognition Arousal/Alertness: Awake/alert Behavior During Therapy: WFL for tasks assessed/performed Overall Cognitive Status: Within Functional Limits for tasks assessed                                          Exercises Total  Joint Exercises Ankle Circles/Pumps: AROM;Both;20 reps;Supine Quad Sets: AROM;Strengthening;Left;15 reps;Supine Short Arc Quad: AAROM;Strengthening;Left;15 reps;Supine Heel Slides: AAROM;Strengthening;Left;15 reps;Supine Hip ABduction/ADduction: AAROM;Strengthening;Left;15 reps;Supine Straight Leg Raises: AAROM;Strengthening;Left;15 reps;Supine Long Arc Quad:  AROM;Strengthening;Left;15 reps;Seated Knee Flexion: AROM;Strengthening;Left;Seated;15 reps    General Comments        Pertinent Vitals/Pain Pain Assessment: 0-10 Pain Score: 5  Pain Location: L knee Pain Descriptors / Indicators: Aching;Discomfort Pain Intervention(s): Limited activity within patient's tolerance;Monitored during session;Ice applied;Premedicated before session;Repositioned    Home Living                          Prior Function            PT Goals (current goals can now be found in the care plan section) Acute Rehab PT Goals Patient Stated Goal: to walk without AD PT Goal Formulation: With patient Time For Goal Achievement: 05/08/21 Potential to Achieve Goals: Good    Frequency    BID      PT Plan      Co-evaluation              AM-PAC PT "6 Clicks" Mobility   Outcome Measure  Help needed turning from your back to your side while in a flat bed without using bedrails?: A Little Help needed moving from lying on your back to sitting on the side of a flat bed without using bedrails?: A Little Help needed moving to and from a bed to a chair (including a wheelchair)?: A Little Help needed standing up from a chair using your arms (e.g., wheelchair or bedside chair)?: A Little Help needed to walk in hospital room?: A Little Help needed climbing 3-5 steps with a railing? : A Little 6 Click Score: 18    End of Session Equipment Utilized During Treatment: Gait belt Activity Tolerance: Patient tolerated treatment well;Patient limited by pain Patient left: with call bell/phone within reach;in bed;with bed alarm set Nurse Communication: Mobility status;Weight bearing status;Precautions PT Visit Diagnosis: Unsteadiness on feet (R26.81);Other abnormalities of gait and mobility (R26.89);Muscle weakness (generalized) (M62.81);Difficulty in walking, not elsewhere classified (R26.2)     Time: [id number] PT Time Calculation (min) (ACUTE ONLY): 38  min  Charges:  $Therapeutic Exercise: 8-22 mins $Therapeutic Activity: 23-37 mins                     Patrina Levering PT, DPT 04/25/21 11:46 AM [id number]

## 2021-04-25 NOTE — Discharge Summary (Signed)
Physician Discharge Summary  Patient ID: Judith [name] MRN: [id number] DOB/AGE: July 20, 1950 70 y.o.  Admit date: 04/24/2021 Discharge date: 04/26/2021  Admission Diagnoses:  S/P TKR (total knee replacement) using cement, left [Z96.652]   Discharge Diagnoses: Patient Active Problem List   Diagnosis Date Noted   S/P TKR (total knee replacement) using cement, left 04/24/2021   High risk medication use 03/04/2021   Urge incontinence of urine 10/25/2020   Unstable gait 09/04/2020   Numbness of left foot 04/16/2020   Statin intolerance 02/13/2020   CKD (chronic kidney disease) stage 3, GFR 30-59 ml/min (HCC) 02/13/2020   Diabetes (HCC) 01/29/2020   Sleep apnea 01/29/2020   Asthma 01/29/2020   Hyperlipidemia 01/29/2020   GERD (gastroesophageal reflux disease) 01/29/2020   Schizoaffective disorder, depressive type (HCC) 01/29/2020   Cognitive disorder 01/29/2020   Bereavement 01/29/2020   History of palpitations 08/14/2019   Hyperlipidemia associated with type 2 diabetes mellitus (HCC) 06/14/2019   Encounter for general adult medical examination without abnormal findings 01/12/2019   Class 2 severe obesity due to excess calories with serious comorbidity and body mass index (BMI) of 36.0 to 36.9 in adult (HCC) 06/15/2018   Morbid obesity with BMI of 40.0-44.9, adult (HCC) 06/15/2018   Bilateral occipital neuralgia 03/09/2018   Dizziness 12/07/2017   Controlled type 2 diabetes mellitus with stage 3 chronic kidney disease, without long-term current use of insulin (HCC) 09/01/2017   Diabetic polyneuropathy associated with type 2 diabetes mellitus (HCC) 09/01/2017   Bilateral carpal tunnel syndrome 03/23/2017   Tremor 10/13/2016   Memory loss due to medical condition 10/13/2016   Headache disorder 10/13/2016   Imbalance 08/23/2016   H/O vitamin D deficiency 10/02/2015   DDD (degenerative disc disease), cervical 05/22/2011   Obstructive sleep apnea on CPAP 01/08/2011   Rosacea  01/08/2011   Migraine headache 01/08/2011   Fibromyalgia 01/08/2011    Past Medical History:  Diagnosis Date   Anginal pain (HCC)    Arthritis    Asthma    Depression    Diabetes mellitus without complication (HCC)    Dysrhythmia    Fibromyalgia    Gastroparesis    GERD (gastroesophageal reflux disease)    Headache    Sleep apnea      Transfusion: none   Consultants (if any):   Discharged Condition: Improved  Hospital Course: Judith [name] is an 70 y.o. female who was admitted 04/24/2021 with a diagnosis of S/P TKR (total knee replacement) using cement, left and went to the operating room on 04/24/2021 and underwent the above named procedures.    Surgeries: Procedure(s): TOTAL KNEE ARTHROPLASTY on 04/24/2021 Patient tolerated the surgery well. Taken to PACU where she was stabilized and then transferred to the orthopedic floor.  Started on Lovenox 30 mg q 12 hrs. Foot pumps applied bilaterally at 80 mm. Heels elevated on bed with rolled towels. No evidence of DVT. Negative Homan. Physical therapy started on day #1 for gait training and transfer. OT started day #1 for ADL and assisted devices.  Patient's foley was d/c on day #1. Patient's IV was d/c on day #2.  On post op day #2 patient was stable and ready for discharge to home with HHPT.  Implants: Medacta  GMK sphere system with 2+ left femur, 2 left tibia with short stem and 12 mm insert.  Size 1 patella, all components cemented.    She was given perioperative antibiotics:  Anti-infectives (From admission, onward)    Start     Dose/Rate Route Frequency  Ordered Stop   04/24/21 1345  ceFAZolin (ANCEF) IVPB 2g/100 mL premix        2 g 200 mL/hr over 30 Minutes Intravenous Every 6 hours 04/24/21 1250 04/25/21 0102   04/24/21 0636  ceFAZolin (ANCEF) 2-4 GM/100ML-% IVPB       Note to Pharmacy: Judith [name] F: cabinet override      04/24/21 0636 04/24/21 0745   04/24/21 0600  ceFAZolin (ANCEF) IVPB 2g/100 mL premix         2 g 200 mL/hr over 30 Minutes Intravenous On call to O.R. 04/24/21 0344 04/24/21 0740     .  She was given sequential compression devices, early ambulation, and Lovenox TEDs for DVT prophylaxis.  She benefited maximally from the hospital stay and there were no complications.    Recent vital signs:  Vitals:   04/26/21 0326 04/26/21 0737  BP: (!) 143/75 134/67  Pulse: 100 91  Resp: 15 16  Temp: 98.1 F (36.7 C) 98.7 F (37.1 C)  SpO2: 98% 99%    Recent laboratory studies:  Lab Results  Component Value Date   HGB 11.1 (L) 04/25/2021   HGB 10.5 (L) 04/24/2021   HGB 12.5 04/14/2021   Lab Results  Component Value Date   WBC 16.0 (H) 04/25/2021   PLT 278 04/25/2021   No results found for: INR Lab Results  Component Value Date   NA 127 (L) 04/25/2021   K 3.9 04/25/2021   CL 97 (L) 04/25/2021   CO2 21 (L) 04/25/2021   BUN 10 04/25/2021   CREATININE 0.65 04/25/2021   GLUCOSE 209 (H) 04/25/2021    Discharge Medications:   Allergies as of 04/26/2021       Reactions   Pravastatin Other (See Comments)   Muscle pain   Topiramate Other (See Comments)   Metformin Nausea Only        Medication List     TAKE these medications    albuterol 108 (90 Base) MCG/ACT inhaler Commonly known as: VENTOLIN HFA Inhale 2 puffs into the lungs every 6 (six) hours as needed for wheezing or shortness of breath.   ARIPiprazole 2 MG tablet Commonly known as: ABILIFY TAKE 1 TABLET(2 MG) BY MOUTH DAILY   azelastine 0.1 % nasal spray Commonly known as: ASTELIN Place 1 spray into both nostrils daily as needed for allergies.   B-D ULTRAFINE III SHORT PEN 31G X 8 MM Misc Generic drug: Insulin Pen Needle   cetirizine 10 MG tablet Commonly known as: ZYRTEC Take 10 mg by mouth daily.   docusate sodium 100 MG capsule Commonly known as: COLACE Take 1 capsule (100 mg total) by mouth 2 (two) times daily.   Dulaglutide 4.5 MG/0.5ML Sopn Inject 4.5 mg into the skin once a  week.   enoxaparin 40 MG/0.4ML injection Commonly known as: LOVENOX Inject 0.4 mLs (40 mg total) into the skin daily for 14 days.   gabapentin 100 MG capsule Commonly known as: NEURONTIN Take 200 mg by mouth at bedtime.   glipiZIDE 10 MG 24 hr tablet Commonly known as: GLUCOTROL XL Take 20 mg by mouth daily with breakfast.   glucose blood test strip 1 each by Other route. Use as instructed   HYDROcodone-acetaminophen 5-325 MG tablet Commonly known as: NORCO/VICODIN Take 1-2 tablets by mouth every 4 (four) hours as needed for moderate pain (pain score 4-6).   Lantus SoloStar 100 UNIT/ML Solostar Pen Generic drug: insulin glargine Inject 28 Units into the skin at bedtime.  linaclotide 145 MCG Caps capsule Commonly known as: LINZESS Take 145 mcg by mouth every other day.   lisinopril 2.5 MG tablet Commonly known as: ZESTRIL Take by mouth.   methocarbamol 500 MG tablet Commonly known as: ROBAXIN Take 1 tablet (500 mg total) by mouth every 6 (six) hours as needed for muscle spasms.   metoprolol succinate 25 MG 24 hr tablet Commonly known as: TOPROL-XL 25 mg daily.   Multi-Vitamin tablet Take 1 tablet by mouth daily.   omeprazole 40 MG capsule Commonly known as: PRILOSEC Take 40 mg by mouth daily.   oxybutynin 5 MG 24 hr tablet Commonly known as: DITROPAN-XL Take 5 mg by mouth daily.   pioglitazone 15 MG tablet Commonly known as: ACTOS Take 15 mg by mouth daily.   polyethylene glycol 17 g packet Commonly known as: MIRALAX / GLYCOLAX Take 17 g by mouth daily as needed for mild constipation.   traMADol 50 MG tablet Commonly known as: ULTRAM Take 1 tablet (50 mg total) by mouth every 6 (six) hours as needed.   venlafaxine XR 75 MG 24 hr capsule Commonly known as: EFFEXOR-XR Take 1 capsule (75 mg total) by mouth daily with breakfast. To be combined with 150 mg daily   venlafaxine XR 150 MG 24 hr capsule Commonly known as: EFFEXOR-XR Take 1 capsule (150 mg  total) by mouth daily with breakfast. To be combined with 75 mg daily   zonisamide 100 MG capsule Commonly known as: ZONEGRAN Take 100 mg by mouth daily.               Durable Medical Equipment  (From admission, onward)           Start     Ordered   04/24/21 1251  DME Walker rolling  Once       Question Answer Comment  Walker: With 5 Inch Wheels   Patient needs a walker to treat with the following condition S/P TKR (total knee replacement) using cement, left      04/24/21 1250   04/24/21 1251  DME 3 n 1  Once        04/24/21 1250   04/24/21 1251  DME Bedside commode  Once       Question:  Patient needs a bedside commode to treat with the following condition  Answer:  S/P TKR (total knee replacement) using cement, left   04/24/21 1250            Diagnostic Studies: DG Knee 1-2 Views Left  Result Date: 04/24/2021 CLINICAL DATA:  Status post left total knee replacement. EXAM: LEFT KNEE - 1-2 VIEW COMPARISON:  None. FINDINGS: The left femoral and tibial components are well situated. Expected postoperative changes are noted in the soft tissues anteriorly. IMPRESSION: Status post left total knee arthroplasty. Electronically Signed   By: Marijo Conception M.D.   On: 04/24/2021 09:45    Disposition:      Follow-up Information     Duanne Guess, PA-C Follow up in 2 week(s).   Specialties: Orthopedic Surgery, Emergency Medicine Contact information: [place] [address] [phone]                  Signed: Prescott Parma, Sherren Mocha 04/26/2021, 8:14 AM

## 2021-04-25 NOTE — Progress Notes (Signed)
Physical Therapy Treatment Patient Details Name: Judith [name] MRN: [id number] DOB: [birth year] Today's Date: 04/25/2021   History of Present Illness 70 y/o female s/p L TKA. PMH includes DM type 2, CKD stage 3, anxiety and depression.    PT Comments    Pt received supine in bed, states she is tired however pain has improved since this morning. 4/10 at rest, 6/10 with activity. Pt continues to perform bed mobility with SUP, PT addressed heavy use of bed rails and pt states she will "figure something out" at home as she does not have bed rails. Pt progressed ambulation distance from 97ft to 49ft. Pt ends session in recliner. PT handed pt exercise packet and encouraged pt to perform. All questions addressed regarding d/c home. Sequencing reviewed for 1 step as pt declined performing again this session. Continues to present with deficits in strength, ROM, pain and endurance. Would benefit from skilled PT to address above deficits and promote optimal return to PLOF.   Recommendations for follow up therapy are one component of a multi-disciplinary discharge planning process, led by the attending physician.  Recommendations may be updated based on patient status, additional functional criteria and insurance authorization.  Follow Up Recommendations  Home health PT     Assistance Recommended at Discharge Intermittent Supervision/Assistance  Equipment Recommendations  Rolling walker (2 wheels);BSC/3in1    Recommendations for Other Services       Precautions / Restrictions Precautions Precautions: Fall Restrictions Weight Bearing Restrictions: Yes LLE Weight Bearing: Weight bearing as tolerated     Mobility  Bed Mobility Overal bed mobility: Needs Assistance Bed Mobility: Supine to Sit     Supine to sit: Supervision;HOB elevated Sit to supine: Supervision   General bed mobility comments: using bed rails and increased time    Transfers Overall transfer level: Needs  assistance Equipment used: Rolling walker (2 wheels) Transfers: Sit to/from Stand Sit to Stand: Min guard Stand pivot transfers: Min guard         General transfer comment: CGA for safety. Increased time to initiate stand    Ambulation/Gait Ambulation/Gait assistance: Min guard Gait Distance (Feet): 50 Feet Assistive device: Rolling walker (2 wheels) Gait Pattern/deviations: Decreased stance time - left;Decreased weight shift to left;Step-to pattern Gait velocity: decreased     General Gait Details: UE support to decrease weightshift onto LLE. Step-to leading with LLE. VC for closer proximity to walker.   Stairs Stairs: Yes Stairs assistance: Min guard Stair Management: No rails;With walker;Forwards Number of Stairs: 1 General stair comments: 1 step (curb) with CGA to steady. PT assisted with lifting RW due to weight of wound vac.   Wheelchair Mobility    Modified Rankin (Stroke Patients Only)       Balance Overall balance assessment: Needs assistance Sitting-balance support: No upper extremity supported;Feet supported Sitting balance-Leahy Scale: Normal     Standing balance support: Bilateral upper extremity supported;During functional activity;Reliant on assistive device for balance Standing balance-Leahy Scale: Poor Standing balance comment: reliant on RW during standing and ambulation. able to don mask in standing (no UE support)                            Cognition Arousal/Alertness: Awake/alert Behavior During Therapy: WFL for tasks assessed/performed Overall Cognitive Status: Within Functional Limits for tasks assessed  General Comments: A&Ox4        Exercises Total Joint Exercises Ankle Circles/Pumps: AROM;Both;20 reps;Supine Quad Sets: AROM;Strengthening;Left;15 reps;Supine Short Arc Quad: AAROM;Strengthening;Left;15 reps;Supine Heel Slides: AAROM;Strengthening;Left;15 reps;Supine Hip  ABduction/ADduction: AAROM;Strengthening;Left;15 reps;Supine Straight Leg Raises: AAROM;Strengthening;Left;15 reps;Supine Long Arc Quad: AROM;Strengthening;Left;15 reps;Seated Knee Flexion: AROM;Strengthening;Left;Seated;15 reps    General Comments        Pertinent Vitals/Pain Pain Assessment: 0-10 Pain Score: 4  Pain Location: L knee Pain Descriptors / Indicators: Aching;Discomfort Pain Intervention(s): Limited activity within patient's tolerance;Monitored during session;Premedicated before session;Repositioned;Ice applied    Home Living Family/patient expects to be discharged to:: Private residence Living Arrangements: Other relatives (sister) Available Help at Discharge: Family;Available 24 hours/day Type of Home: House Home Access: Stairs to enter Entrance Stairs-Rails: None Entrance Stairs-Number of Steps: 1   Home Layout: One level Home Equipment: Cane - single point;Grab bars - tub/shower;Shower seat Additional Comments: was not using AD prior to surgery    Prior Function            PT Goals (current goals can now be found in the care plan section) Acute Rehab PT Goals Patient Stated Goal: to walk without AD PT Goal Formulation: With patient Time For Goal Achievement: 05/08/21 Potential to Achieve Goals: Good    Frequency    BID      PT Plan      Co-evaluation              AM-PAC PT "6 Clicks" Mobility   Outcome Measure  Help needed turning from your back to your side while in a flat bed without using bedrails?: A Little Help needed moving from lying on your back to sitting on the side of a flat bed without using bedrails?: A Little Help needed moving to and from a bed to a chair (including a wheelchair)?: A Little Help needed standing up from a chair using your arms (e.g., wheelchair or bedside chair)?: A Little Help needed to walk in hospital room?: A Little Help needed climbing 3-5 steps with a railing? : A Little 6 Click Score: 18     End of Session Equipment Utilized During Treatment: Gait belt Activity Tolerance: Patient tolerated treatment well;Patient limited by pain Patient left: with call bell/phone within reach;in chair;with chair alarm set Nurse Communication: Mobility status;Weight bearing status;Precautions PT Visit Diagnosis: Unsteadiness on feet (R26.81);Other abnormalities of gait and mobility (R26.89);Muscle weakness (generalized) (M62.81);Difficulty in walking, not elsewhere classified (R26.2)     Time: [id number] PT Time Calculation (min) (ACUTE ONLY): 18 min  Charges:  $Therapeutic Exercise: 8-22 mins $Therapeutic Activity: 23-37 mins                     Patrina Levering PT, DPT 04/25/21 3:02 PM [id number]

## 2021-04-25 NOTE — Discharge Instructions (Signed)

## 2021-04-26 DIAGNOSIS — M1712 Unilateral primary osteoarthritis, left knee: Secondary | ICD-10-CM | POA: Diagnosis not present

## 2021-04-26 LAB — GLUCOSE, CAPILLARY: Glucose-Capillary: 197 mg/dL — ABNORMAL HIGH (ref 70–99)

## 2021-04-26 NOTE — TOC Transition Note (Signed)
Transition of Care [facility]) - CM/SW Discharge Note   Patient Details  Name: Judith [name] MRN: [id number] Date of Birth: 01-Sep-1950  Transition of Care [facility]) CM/SW Contact:  Liliana Cline, LCSW Phone Number: 04/26/2021, 9:45 AM   Clinical Narrative:    Patient has orders to DC home today. CSW notified [place] with [facility].   Final next level of care: Home w Home Health Services Barriers to Discharge: Barriers Resolved   Patient Goals and CMS Choice        Discharge Placement                       Discharge Plan and Services   Discharge Planning Services: CM Consult            DME Arranged: Dan Humphreys rolling, 3-N-1 DME Agency: [employer] Date DME Agency Contacted: 04/24/21 Time DME Agency Contacted: [phone] Representative spoke with at DME Agency: intake HH Arranged: PT HH Agency: [facility] Date HH Agency Contacted: 04/26/21 Time HH Agency Contacted: 1333 Representative spoke with at [facility] Agency: [place]  Social Determinants of Health (SDOH) Interventions     Readmission Risk Interventions No flowsheet data found.

## 2021-04-26 NOTE — Progress Notes (Signed)
Discharge instructions gone over with patient and patient verbally expressed understanding. IV was taken out. All belongings were sent with the patient. Patient wheeled to medical mall by staff.

## 2021-04-26 NOTE — Progress Notes (Signed)
Physical Therapy Treatment Patient Details Name: Judith [name] MRN: [id number] DOB: 1950-10-05 Today's Date: 04/26/2021   History of Present Illness 70 y/o female s/p L TKA. PMH includes DM type 2, CKD stage 3, anxiety and depression.    PT Comments    Patient agreeable to PT. States she just got back to bed from going to the bathroom and rates L knee pain at 7/10. Patient requested pain meds from RN but had been given her last dose 30 min ago so none were administered. Patient was mod I to go supine to sit with use of bed rails. She states her sister can help her at home since she does not have bed rails. She required supervision for sit <> stand transfers using RW mostly due to needing cuing to reach back while going to sit. She increased ambulation distance with RW to 100 feet with supervision. She declined to practice step again, stating she felt confident she can perform at home. She also reported confidence with HEP handout. Patient appears to be ready to discharge home from a PT perspective. Patient would benefit from skilled physical therapy to address impairments and functional limitations to work towards stated goals and return to PLOF or maximal functional independence.    Recommendations for follow up therapy are one component of a multi-disciplinary discharge planning process, led by the attending physician.  Recommendations may be updated based on patient status, additional functional criteria and insurance authorization.  Follow Up Recommendations  Home health PT     Assistance Recommended at Discharge Intermittent Supervision/Assistance  Equipment Recommendations  Rolling walker (2 wheels);BSC/3in1    Recommendations for Other Services       Precautions / Restrictions Restrictions Weight Bearing Restrictions: Yes LLE Weight Bearing: Weight bearing as tolerated     Mobility  Bed Mobility Overal bed mobility: Needs Assistance Bed Mobility: Supine to Sit     Supine  to sit: HOB elevated;Modified independent (Device/Increase time)     General bed mobility comments: using bed rails and increased time    Transfers Overall transfer level: Needs assistance Equipment used: Rolling walker (2 wheels) Transfers: Sit to/from Stand Sit to Stand: Supervision           General transfer comment: good body/AD positioning except needed cuing to remember to reach back before sitting. no LOB    Ambulation/Gait Ambulation/Gait assistance: Min guard;Supervision Gait Distance (Feet): 100 Feet Assistive device: Rolling walker (2 wheels) Gait Pattern/deviations: Decreased stance time - left;Decreased weight shift to left;Step-to pattern Gait velocity: decreased     General Gait Details: UE support to decrease weightshift onto LLE. Step-to leading with LLE. appeared out of breath and pulling at mask at times but SpO2 in high 90s when measured.   Stairs         General stair comments: patient declined to practice step today. States she feels comfortable doing it at home.   Wheelchair Mobility    Modified Rankin (Stroke Patients Only)       Balance Overall balance assessment: Needs assistance Sitting-balance support: No upper extremity supported;Feet supported Sitting balance-Leahy Scale: Normal     Standing balance support: Bilateral upper extremity supported;During functional activity;Reliant on assistive device for balance Standing balance-Leahy Scale: Fair Standing balance comment: reliant on RW during standing and ambulation. able to take hands of RW when standing.                            Cognition Arousal/Alertness:  Awake/alert Behavior During Therapy: WFL for tasks assessed/performed Overall Cognitive Status: Within Functional Limits for tasks assessed                                          Exercises Total Joint Exercises Goniometric ROM: L knee PROM: lacks ~ 15 degrees extension; Flexion: 90 degrees  with end rang pain and stiffness.    General Comments        Pertinent Vitals/Pain Pain Score: 7  Pain Location: L knee Pain Descriptors / Indicators: Discomfort Pain Intervention(s): Limited activity within patient's tolerance;Monitored during session;Premedicated before session;Patient requesting pain meds-RN notified;Other (comment) (patient had had pain meds 30 min prior)    Home Living                          Prior Function            PT Goals (current goals can now be found in the care plan section) Acute Rehab PT Goals Patient Stated Goal: to walk without AD PT Goal Formulation: With patient Time For Goal Achievement: 05/08/21 Potential to Achieve Goals: Good Progress towards PT goals: Progressing toward goals    Frequency    BID      PT Plan      Co-evaluation              AM-PAC PT "6 Clicks" Mobility   Outcome Measure  Help needed turning from your back to your side while in a flat bed without using bedrails?: A Little Help needed moving from lying on your back to sitting on the side of a flat bed without using bedrails?: A Little Help needed moving to and from a bed to a chair (including a wheelchair)?: A Little Help needed standing up from a chair using your arms (e.g., wheelchair or bedside chair)?: A Little Help needed to walk in hospital room?: A Little Help needed climbing 3-5 steps with a railing? : A Little 6 Click Score: 18    End of Session Equipment Utilized During Treatment: Gait belt Activity Tolerance: Patient tolerated treatment well;Patient limited by pain Patient left: with call bell/phone within reach;in chair;with chair alarm set;with nursing/sitter in room Nurse Communication: Mobility status;Other (comment) (requested for pain meds) PT Visit Diagnosis: Unsteadiness on feet (R26.81);Other abnormalities of gait and mobility (R26.89);Muscle weakness (generalized) (M62.81);Difficulty in walking, not elsewhere  classified (R26.2)     Time: [id number] PT Time Calculation (min) (ACUTE ONLY): 30 min  Charges:  $Gait Training: 8-22 mins $Therapeutic Activity: 8-22 mins                     Everlean Alstrom. Graylon Good, PT, DPT 04/26/21, 10:14 AM

## 2021-04-26 NOTE — Progress Notes (Signed)
° °  Subjective: 2 Days Post-Op Procedure(s) (LRB): TOTAL KNEE ARTHROPLASTY (Left) Patient reports pain as mild Patient is well, and has had no acute complaints or problems Denies any CP, SOB, ABD pain. We will continue therapy today.  Plan is to go Home after hospital stay.  Objective: Vital signs in last 24 hours: Temp:  [97.8 F (36.6 C)-98.7 F (37.1 C)] 98.7 F (37.1 C) (12/31 0737) Pulse Rate:  [89-101] 91 (12/31 0737) Resp:  [15-17] 16 (12/31 0737) BP: (134-152)/(62-75) 134/67 (12/31 0737) SpO2:  [96 %-99 %] 99 % (12/31 0737)  Intake/Output from previous day: 12/30 0701 - 12/31 0700 In: 480 [P.O.:480] Out: 2600 [Urine:2600] Intake/Output this shift: No intake/output data recorded.  Recent Labs    04/24/21 1259 04/25/21 0723  HGB 10.5* 11.1*   Recent Labs    04/24/21 1259 04/25/21 0723  WBC 12.5* 16.0*  RBC 3.37* 3.69*  HCT 31.6* 32.7*  PLT 228 278   Recent Labs    04/24/21 1259 04/25/21 0723  NA  --  127*  K  --  3.9  CL  --  97*  CO2  --  21*  BUN  --  10  CREATININE 0.81 0.65  GLUCOSE  --  209*  CALCIUM  --  8.3*   No results for input(s): LABPT, INR in the last 72 hours.  EXAM General - Patient is Alert and Appropriate Extremity - Neurovascular intact Sensation intact distally Intact pulses distally Dorsiflexion/Plantar flexion intact Dressing - dressing C/D/I, provena intact with out drainage Motor Function - intact, moving foot and toes well on exam.  Ambulated 50 feet.  Past Medical History:  Diagnosis Date   Anginal pain (HCC)    Arthritis    Asthma    Depression    Diabetes mellitus without complication (HCC)    Dysrhythmia    Fibromyalgia    Gastroparesis    GERD (gastroesophageal reflux disease)    Headache    Sleep apnea     Assessment/Plan:   2 Days Post-Op Procedure(s) (LRB): TOTAL KNEE ARTHROPLASTY (Left) Principal Problem:   S/P TKR (total knee replacement) using cement, left  Estimated body mass index is  39.47 kg/m as calculated from the following:   Height as of 04/14/21: 5\' 2"  (1.575 m).   Weight as of 04/14/21: 97.9 kg. Advance diet Up with therapy Labs and VSS Pain well controlled CM to assist with discharge to home with HHPT, planning for today. DVT Prophylaxis - Lovenox, TED hose, and SCDs Weight-Bearing as tolerated to left leg   04/16/21 PA-C [facility] 04/26/2021, 8:12 AM

## 2021-06-04 ENCOUNTER — Other Ambulatory Visit: Payer: Self-pay

## 2021-06-04 ENCOUNTER — Ambulatory Visit (INDEPENDENT_AMBULATORY_CARE_PROVIDER_SITE_OTHER): Payer: Medicare PPO | Admitting: Psychiatry

## 2021-06-04 ENCOUNTER — Encounter: Payer: Self-pay | Admitting: Psychiatry

## 2021-06-04 VITALS — BP 142/83 | HR 105 | Temp 97.9°F | Wt 209.4 lb

## 2021-06-04 DIAGNOSIS — F251 Schizoaffective disorder, depressive type: Secondary | ICD-10-CM | POA: Diagnosis not present

## 2021-06-04 DIAGNOSIS — Z79899 Other long term (current) drug therapy: Secondary | ICD-10-CM

## 2021-06-04 DIAGNOSIS — Z634 Disappearance and death of family member: Secondary | ICD-10-CM | POA: Diagnosis not present

## 2021-06-04 MED ORDER — VENLAFAXINE HCL ER 150 MG PO CP24: 150.0000 mg | ORAL_CAPSULE | Freq: Every day | ORAL | 0 refills | Status: DC

## 2021-06-04 MED ORDER — ARIPIPRAZOLE 2 MG PO TABS: ORAL_TABLET | ORAL | 0 refills | Status: DC

## 2021-06-04 MED ORDER — VENLAFAXINE HCL ER 75 MG PO CP24: 75.0000 mg | ORAL_CAPSULE | Freq: Every day | ORAL | 0 refills | Status: DC

## 2021-06-04 NOTE — Progress Notes (Signed)
[place] MD OP Progress Note  06/04/2021 1:41 PM Judith [name]  MRN:  [id number]  Chief Complaint:  Chief Complaint   Follow-up 71 year old Caucasian female, who has a history of schizoaffective disorder, obstructive sleep apnea on CPAP, diabetes mellitus, hyperlipidemia was evaluated for medication management.    HPI: Judith [name] is a 71 year old Caucasian female, widowed, lives in [place], currently on [place], has a history of schizoaffective disorder, obstructive sleep apnea on CPAP, diabetes mellitus, hyperlipidemia, fibromyalgia, history of multiple surgeries was evaluated in office today.  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  She is currently in physical therapy.  She reports she is recovering well.  She currently rates her pain as very minimal at 3 out of 10 and it does not bother her much.   Patient reports mood symptoms are stable.  Denies any significant mood lability or anxiety symptoms.  Denies any perceptual disturbances.  Reports sleep is overall good.  Reports her sister is supportive .  Her sister has been driving to her appointments since her surgery.  However patient was able to drive here today for her appointment and feels good about it.  Denies any side effects to medications and reports compliance with Abilify and venlafaxine.  Denies any other concerns today.  Visit Diagnosis:    ICD-10-CM   1. Schizoaffective disorder, depressive type (HCC)  F25.1 venlafaxine XR (EFFEXOR-XR) 75 MG 24 hr capsule    venlafaxine XR (EFFEXOR-XR) 150 MG 24 hr capsule    ARIPiprazole (ABILIFY) 2 MG tablet    2. Bereavement  Z63.4     3. High risk medication use  Z79.899       Past Psychiatric History: Reviewed past psychiatric history from progress note on 01/29/2020.  Past Medical History:  Past Medical History:  Diagnosis Date   Anginal pain ([place])    Arthritis    Asthma    Depression    Diabetes mellitus without complication (HCC)     Dysrhythmia    Fibromyalgia    Gastroparesis    GERD (gastroesophageal reflux disease)    Headache    Sleep apnea     Past Surgical History:  Procedure Laterality Date   ABDOMINAL HYSTERECTOMY     BLADDER REPAIR     CATARACT EXTRACTION Bilateral 02/08/2013   CHOLECYSTECTOMY     ESOPHAGOGASTRODUODENOSCOPY (EGD) WITH PROPOFOL N/A 04/01/2016   Procedure: ESOPHAGOGASTRODUODENOSCOPY (EGD) WITH PROPOFOL;  Surgeon: Manya Silvas, MD;  Location: [facility];  Service: Endoscopy;  Laterality: N/A;   rotator cuff surgery Right    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  Family History:  Family History  Problem Relation Age of Onset   Bipolar disorder Sister    Bipolar disorder Brother    Breast cancer Neg Hx     Social History: Reviewed social history from progress note on 01/29/2020. Social History   Socioeconomic History   Marital status: Widowed    Spouse name: Not on file   Number of children: 2   Years of education: Not on file   Highest education level: Not on file  Occupational History   Not on file  Tobacco Use   Smoking status: Never   Smokeless tobacco: Never  Vaping Use   Vaping Use: Never used  Substance and Sexual Activity   Alcohol use: Yes    Comment: averages lessthan 1 drink per week   Drug use: No   Sexual activity: Never  Other Topics Concern   Not on file  Social History Narrative   Retired   Investment banker, operational of Radio broadcast assistant Strain: Not on file  Food Insecurity: Not on file  Transportation Needs: Not on file  Physical Activity: Not on file  Stress: Not on file  Social Connections: Not on file    Allergies:  Allergies  Allergen Reactions   Pravastatin Other (See  Comments)    Muscle pain   Topiramate Other (See Comments)   Metformin Nausea Only    Metabolic Disorder Labs: No results found for: HGBA1C, MPG Lab Results  Component Value Date   PROLACTIN 10.4 03/04/2021   No results found for: CHOL, TRIG, HDL, CHOLHDL, VLDL, LDLCALC No results found for: TSH  Therapeutic Level Labs: No results found for: LITHIUM No results found for: VALPROATE No components found for:  CBMZ  Current Medications: Current Outpatient Medications  Medication Sig Dispense Refill   albuterol (PROVENTIL HFA;VENTOLIN HFA) 108 (90 Base) MCG/ACT inhaler Inhale 2 puffs into the lungs every 6 (six) hours as needed for wheezing or shortness of breath.     azelastine (ASTELIN) 0.1 % nasal spray Place 1 spray into both nostrils daily as needed for allergies.     B-D ULTRAFINE III SHORT PEN 31G X 8 MM MISC      cetirizine (ZYRTEC) 10 MG tablet Take 10 mg by mouth daily.     docusate sodium (COLACE) 100 MG capsule Take 1 capsule (100 mg total) by mouth 2 (two) times daily. 10 capsule 0   gabapentin (NEURONTIN) 100 MG capsule Take 200 mg by mouth at bedtime.     glipiZIDE (GLUCOTROL XL) 10 MG 24 hr tablet Take 20 mg by mouth daily with breakfast.     glucose blood test strip 1 each by Other route. Use as instructed     HYDROcodone-acetaminophen (NORCO/VICODIN) 5-325 MG tablet Take 1-2 tablets by mouth every 4 (four) hours as needed for moderate pain (pain score 4-6). 30 tablet 0   LANTUS SOLOSTAR 100 UNIT/ML Solostar Pen Inject 28 Units into the skin at bedtime.     liraglutide (VICTOZA) 18 MG/3ML SOPN Inject into the skin.     methocarbamol (ROBAXIN) 500 MG tablet Take 1 tablet (500 mg total) by mouth every 6 (six) hours as needed for muscle spasms. 30 tablet 0   metoprolol succinate (TOPROL-XL) 25 MG 24 hr tablet 25 mg daily.     Multiple Vitamin (MULTI-VITAMIN) tablet Take 1 tablet by mouth daily.     omeprazole (PRILOSEC) 40 MG capsule Take 40 mg by mouth daily.      oxybutynin (DITROPAN-XL) 5 MG 24 hr tablet Take 5 mg by mouth daily.     pioglitazone (ACTOS) 15 MG tablet Take 15 mg by mouth daily.     polyethylene glycol (MIRALAX / GLYCOLAX) 17 g packet Take 17 g by mouth daily as needed for mild constipation. 14 each 0   polyethylene glycol powder (GLYCOLAX/MIRALAX) 17 GM/SCOOP powder Take by mouth.     traMADol (ULTRAM) 50 MG tablet Take 1 tablet (50 mg total) by mouth every 6 (six) hours as needed. 30 tablet 0   zonisamide (ZONEGRAN) 100 MG capsule Take 100 mg by mouth daily.     ARIPiprazole (ABILIFY) 2 MG tablet TAKE 1 TABLET(2 MG) BY MOUTH  DAILY 90 tablet 0   Dulaglutide 4.5 MG/0.5ML SOPN Inject 4.5 mg into the skin once a week. (Patient not taking: Reported on 06/04/2021)     enoxaparin (LOVENOX) 40 MG/0.4ML injection Inject 0.4 mLs (40 mg total) into the skin daily for 14 days. 5.6 mL 0   linaclotide (LINZESS) 145 MCG CAPS capsule Take 145 mcg by mouth every other day.     lisinopril (ZESTRIL) 2.5 MG tablet Take by mouth. (Patient not taking: No sig reported)     venlafaxine XR (EFFEXOR-XR) 150 MG 24 hr capsule Take 1 capsule (150 mg total) by mouth daily with breakfast. To be combined with 75 mg daily 90 capsule 0   venlafaxine XR (EFFEXOR-XR) 75 MG 24 hr capsule Take 1 capsule (75 mg total) by mouth daily with breakfast. To be combined with 150 mg daily 90 capsule 0   VICTOZA 18 MG/3ML SOPN Inject into the skin.     No current facility-administered medications for this visit.     Musculoskeletal: Strength & Muscle Tone: within normal limits Gait & Station:  walks with cane - s/p knee surgery Patient leans: N/A  Psychiatric Specialty Exam: Review of Systems  Musculoskeletal:        Left knee - s/p surgery - mild pain  Psychiatric/Behavioral:  Negative for agitation, behavioral problems, confusion, decreased concentration, dysphoric mood, self-injury, sleep disturbance and suicidal ideas. The patient is not nervous/anxious.   All other systems  reviewed and are negative.  Blood pressure (!) 142/83, pulse (!) 105, temperature 97.9 F (36.6 C), temperature source Temporal, weight 209 lb 6.4 oz (95 kg).Body mass index is 38.3 kg/m.  General Appearance: Casual  Eye Contact:  Fair  Speech:  Clear and Coherent  Volume:  Normal  Mood:  Euthymic  Affect:  Congruent  Thought Process:  Goal Directed and Descriptions of Associations: Intact  Orientation:  Full (Time, Place, and Person)  Thought Content: Logical   Suicidal Thoughts:  No  Homicidal Thoughts:  No  Memory:  Immediate;   Fair Recent;   Fair Remote;   Fair  Judgement:  Fair  Insight:  Fair  Psychomotor Activity:  Normal  Concentration:  Concentration: Fair and Attention Span: Fair  Recall:  [employer] of Knowledge: Fair  Language: Fair  Akathisia:  No  Handed:  Right  AIMS (if indicated): done, 0  Assets:  Communication Skills Desire for Improvement Housing Social Support  ADL's:  Intact  Cognition: WNL  Sleep:  Fair   Screenings: [place] Office Visit from 06/04/2021 in [facility] Office Visit from 12/31/2020 in [facility] Total Score 0 0      GAD-7    [place] Visit from 06/04/2021 in [facility] Visit from 12/31/2020 in [facility] Video Visit from 10/01/2020 in [facility]  Total GAD-7 Score 0 0 0      PHQ2-9    [place] Visit from 06/04/2021 in [facility] from 03/04/2021 in [place] [facility] Visit from 12/31/2020 in [facility] Video Visit from 10/01/2020 in [facility] from 06/27/2020 in [place] [facility]  PHQ-2 Total Score 1 0 0 0 0      [place] Admission (Discharged) from 04/24/2021 in [facility] (1A) Pre-Admission Testing 60 from 04/14/2021 in [facility]  TESTING Office Visit from 03/04/2021 in [facility] RISK CATEGORY No Risk No Risk No Risk        Assessment and Plan: Judith [name] is a 71 year old Caucasian female, widowed, lives in [place], has a history of schizoaffective disorder, recent left knee total replacement surgery was evaluated in office today.  Patient is currently stable.  Plan Schizoaffective disorder-stable Abilify 2 mg p.o. daily Effexor extended release 225 mg p.o. daily  Bereavement-stable We will monitor closely Continue CBT with therapist  High risk medication use-reviewed and discussed prolactin level-dated 03/04/2021-10.4-within normal limits  Follow-up in clinic in 3 months or sooner if needed.  This note was generated in part or whole with voice recognition software. Voice recognition is usually quite accurate but there are transcription errors that can and very often do occur. I apologize for any typographical errors that were not detected and corrected.      Ursula Alert, MD 06/04/2021, 1:41 PM

## 2021-07-15 DIAGNOSIS — M8588 Other specified disorders of bone density and structure, other site: Secondary | ICD-10-CM | POA: Insufficient documentation

## 2021-07-26 IMAGING — MG MM DIGITAL SCREENING BILAT W/ TOMO AND CAD
6 of 12 series · 6 of 36 positions shown · non-contrast
Comparison: Previous exam(s).

CLINICAL DATA: Screening.

EXAM:
DIGITAL SCREENING BILATERAL MAMMOGRAM WITH TOMOSYNTHESIS AND CAD
TECHNIQUE: Bilateral screening digital craniocaudal and mediolateral oblique
mammograms were obtained. Bilateral screening digital breast
tomosynthesis was performed. The images were evaluated with
computer-aided detection.

[L MLO synth-2D]
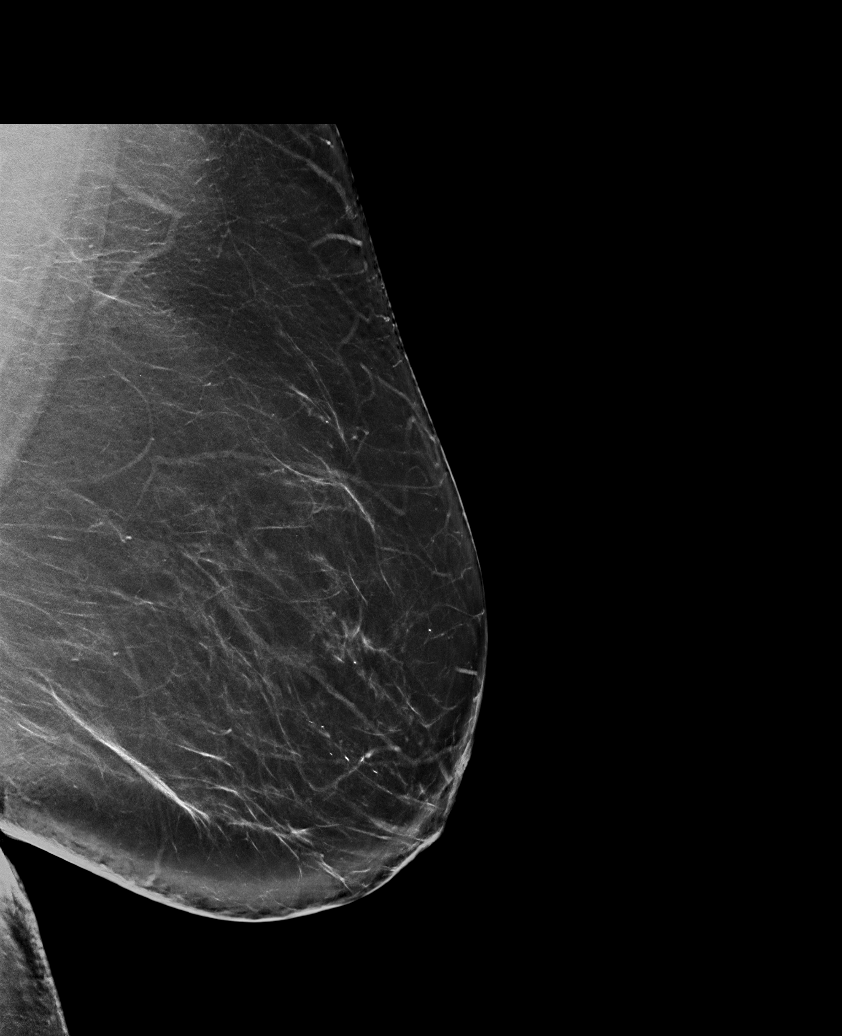

[R CV synth-2D]
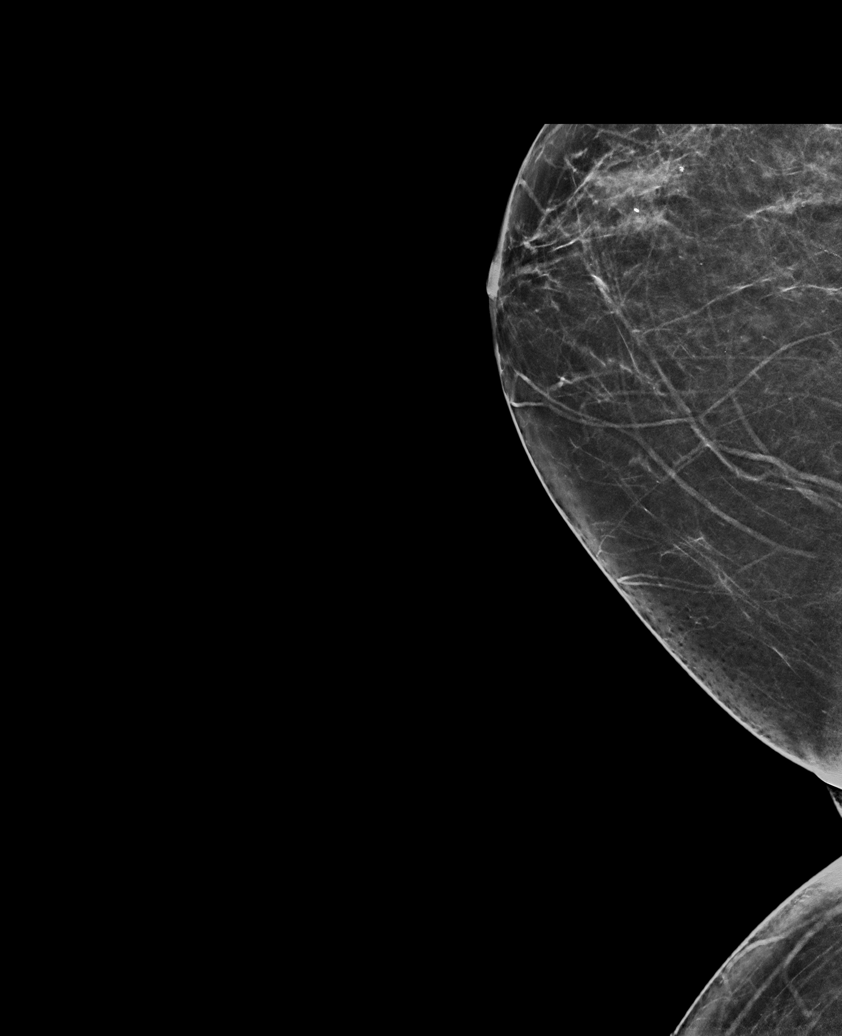

[L CC synth-2D]
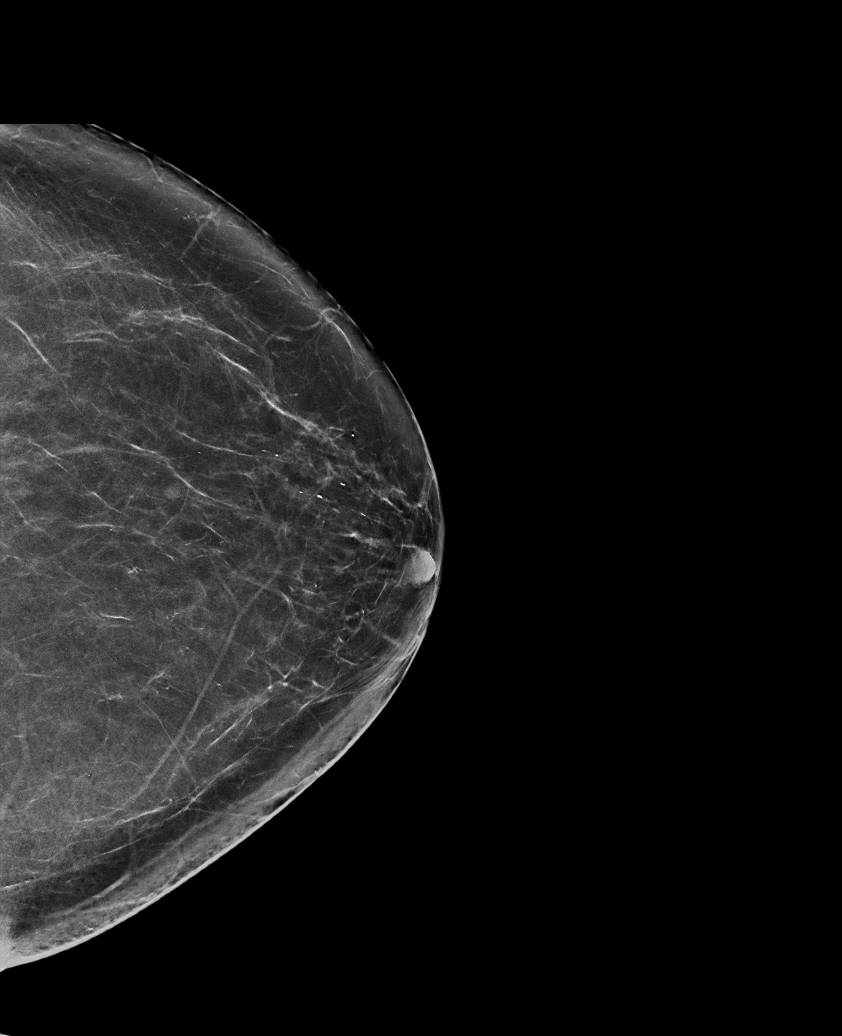

[R CC synth-2D]
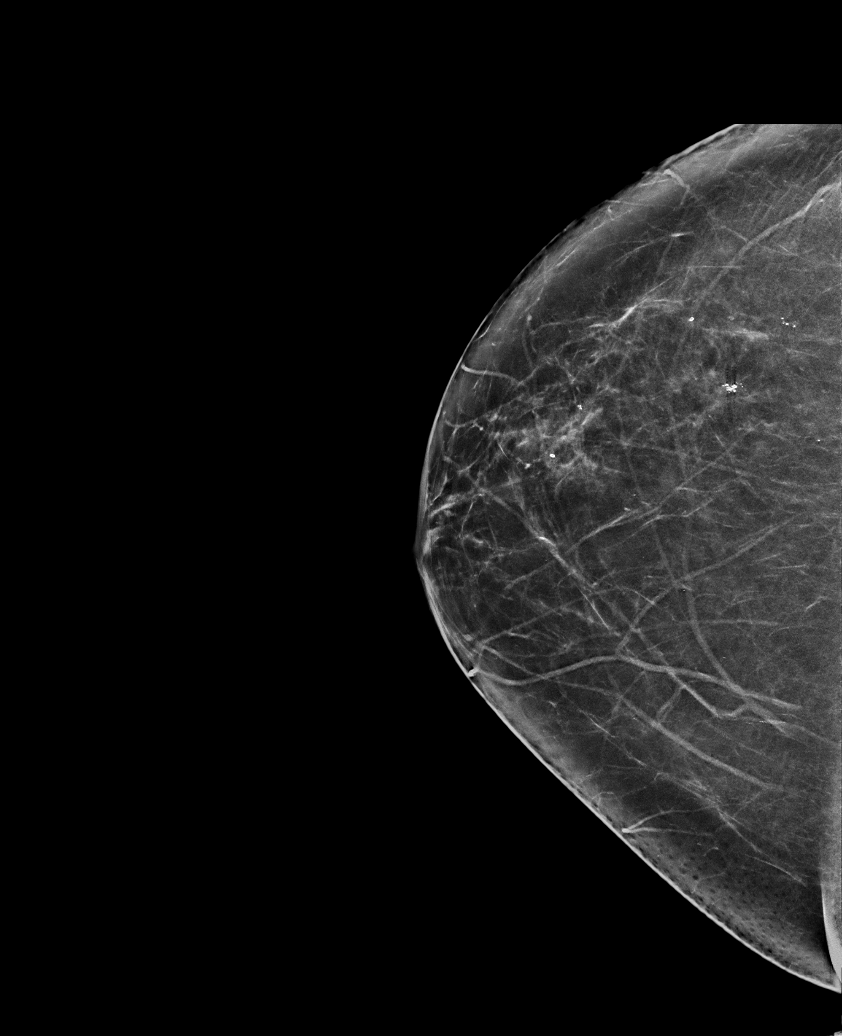

[R MLO synth-2D (1 of 2)]
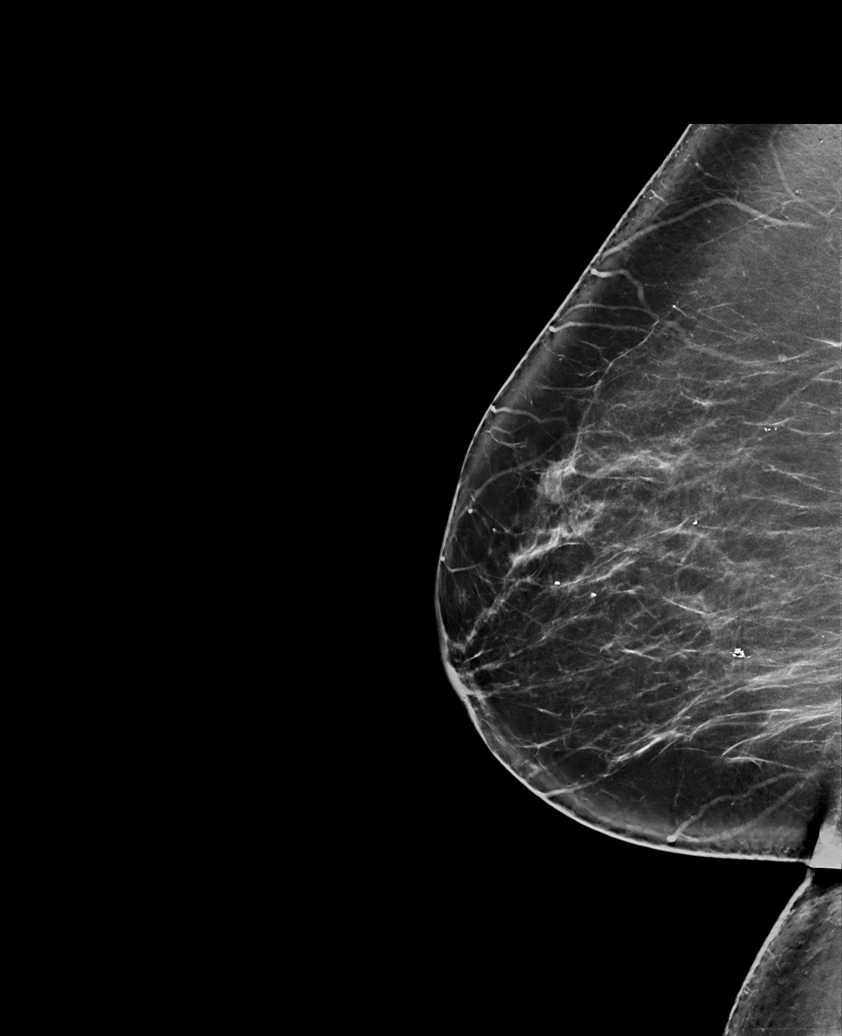

[R MLO synth-2D (2 of 2)]
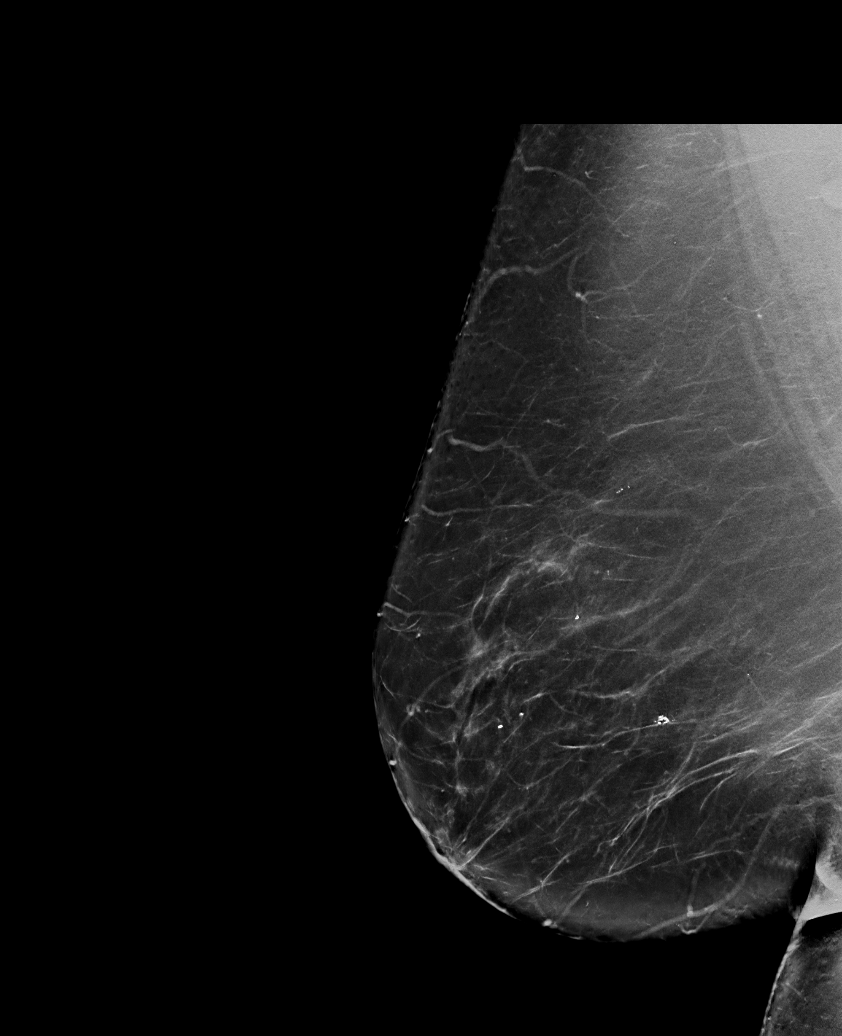

[6 of 36 positions shown; findings below may reference images not displayed]

ACR Breast Density Category b: There are scattered areas of
fibroglandular density.
FINDINGS: There are no findings suspicious for malignancy. The images were
evaluated with computer-aided detection.
IMPRESSION: No mammographic evidence of malignancy. A result letter of this
screening mammogram will be mailed directly to the patient.

RECOMMENDATION:
Screening mammogram in one year. (Code:[id number])

BI-RADS CATEGORY  1: Negative.

## 2021-08-25 ENCOUNTER — Telehealth: Payer: Self-pay

## 2021-08-25 DIAGNOSIS — F251 Schizoaffective disorder, depressive type: Secondary | ICD-10-CM

## 2021-08-25 MED ORDER — VENLAFAXINE HCL ER 150 MG PO CP24: 150.0000 mg | ORAL_CAPSULE | Freq: Every day | ORAL | 0 refills | Status: DC

## 2021-08-25 MED ORDER — VENLAFAXINE HCL ER 75 MG PO CP24: 75.0000 mg | ORAL_CAPSULE | Freq: Every day | ORAL | 0 refills | Status: DC

## 2021-08-25 MED ORDER — ARIPIPRAZOLE 2 MG PO TABS: ORAL_TABLET | ORAL | 0 refills | Status: DC

## 2021-08-25 NOTE — Telephone Encounter (Signed)
Contacted patient to discuss.  Patient okay with sending her medications to [facility]. ? ?I have sent venlafaxine as well as Abilify to [facility]. ?

## 2021-08-25 NOTE — Telephone Encounter (Signed)
received fax requesting a refill on the venlafaxine er 75mg   and the 150mg   ?

## 2021-08-26 ENCOUNTER — Telehealth: Payer: Self-pay

## 2021-08-26 NOTE — Telephone Encounter (Signed)
Please let patient know to contact her primary doctor.  I did not prescribe her that medication. ?

## 2021-08-26 NOTE — Telephone Encounter (Signed)
received fax requesting a refill on the linzess.  ?

## 2021-08-28 NOTE — Telephone Encounter (Signed)
Notified the pharmacy and left message for patient also ?

## 2021-09-10 ENCOUNTER — Encounter: Payer: Self-pay | Admitting: Psychiatry

## 2021-09-10 ENCOUNTER — Ambulatory Visit (INDEPENDENT_AMBULATORY_CARE_PROVIDER_SITE_OTHER): Payer: Medicare PPO | Admitting: Psychiatry

## 2021-09-10 VITALS — BP 132/65 | HR 96 | Temp 97.9°F | Wt 220.0 lb

## 2021-09-10 DIAGNOSIS — Z634 Disappearance and death of family member: Secondary | ICD-10-CM

## 2021-09-10 DIAGNOSIS — F251 Schizoaffective disorder, depressive type: Secondary | ICD-10-CM

## 2021-09-10 NOTE — Progress Notes (Signed)
[facility] MD OP Progress Note  09/10/2021 2:26 PM [name]  MRN:  [id number]  Chief Complaint:  Chief Complaint  Patient presents with   Follow-up: 71 year old Caucasian female has a history of schizoaffective disorder, obstructive sleep apnea on CPAP, presented for medication management.   HPI: Judith [name] is a 71 year old Caucasian female, widowed, lives in [place], currently on S[place], has a history of schizoaffective disorder, obstructive sleep apnea on CPAP, diabetes melitis, hyperlipidemia, fibromyalgia, history of multiple surgeries was evaluated in office today.  Patient is currently recovering from left-sided total knee replacement.  Patient reports she is trying to get her 38-month-old dog out for walks when she can.  Patient reports she lost her previous dog and that did not make her sad.  However she has been trying to spend more time with her new dog and that helps.  Patient is currently compliant on her Abilify, venlafaxine.  Denies side effects.  Patient reports sleep is overall okay.  Does use CPAP.  She does have obstructive sleep apnea and that does make her tired during the day at times.  She tries to take naps as needed.  Denies suicidality, homicidality or perceptual disturbances.  Patient denies any other concerns today.  Visit Diagnosis:    ICD-10-CM   1. Schizoaffective disorder, depressive type (HCC)  F25.1     2. Bereavement  Z63.4       Past Psychiatric History: Reviewed past psychiatric history from progress note on 01/29/2020.  Past Medical History:  Past Medical History:  Diagnosis Date   Anginal pain (HCC)    Arthritis    Asthma    Depression    Diabetes mellitus without complication (HCC)    Dysrhythmia    Fibromyalgia    Gastroparesis    GERD (gastroesophageal reflux disease)    Headache    Sleep apnea     Past Surgical History:  Procedure Laterality Date   ABDOMINAL HYSTERECTOMY     BLADDER REPAIR     CATARACT EXTRACTION Bilateral  02/08/2013   CHOLECYSTECTOMY     ESOPHAGOGASTRODUODENOSCOPY (EGD) WITH PROPOFOL N/A 04/01/2016   Procedure: ESOPHAGOGASTRODUODENOSCOPY (EGD) WITH PROPOFOL;  Surgeon: Scot Jun, MD;  Location: [facility];  Service: Endoscopy;  Laterality: N/A;   rotator cuff surgery Right    TOTAL KNEE ARTHROPLASTY Left 04/24/2021   Procedure: TOTAL KNEE ARTHROPLASTY;  Surgeon: Kennedy Bucker, MD;  Location: [facility];  Service: Orthopedics;  Laterality: Left;   TUBAL LIGATION      Family Psychiatric History: Reviewed family psychiatric history from progress note on 01/29/2020.  Family History:  Family History  Problem Relation Age of Onset   Bipolar disorder Sister    Bipolar disorder Brother    Breast cancer Neg Hx     Social History: Reviewed social history from progress note on 01/29/2020. Social History   Socioeconomic History   Marital status: Widowed    Spouse name: Not on file   Number of children: 2   Years of education: Not on file   Highest education level: Not on file  Occupational History   Not on file  Tobacco Use   Smoking status: Never   Smokeless tobacco: Never  Vaping Use   Vaping Use: Never used  Substance and Sexual Activity   Alcohol use: Yes    Comment: averages lessthan 1 drink per week   Drug use: No   Sexual activity: Never  Other Topics Concern   Not on file  Social History Narrative   Retired  Social Determinants of Health   Financial Resource Strain: Not on file  Food Insecurity: Not on file  Transportation Needs: Not on file  Physical Activity: Not on file  Stress: Not on file  Social Connections: Not on file    Allergies:  Allergies  Allergen Reactions   Pravastatin Other (See Comments)    Muscle pain   Topiramate Other (See Comments)   Metformin Nausea Only    Metabolic Disorder Labs: No results found for: HGBA1C, MPG Lab Results  Component Value Date   PROLACTIN 10.4 03/04/2021   No results found for: CHOL, TRIG, HDL,  CHOLHDL, VLDL, LDLCALC No results found for: TSH  Therapeutic Level Labs: No results found for: LITHIUM No results found for: VALPROATE No components found for:  CBMZ  Current Medications: Current Outpatient Medications  Medication Sig Dispense Refill   albuterol (PROVENTIL HFA;VENTOLIN HFA) 108 (90 Base) MCG/ACT inhaler Inhale 2 puffs into the lungs every 6 (six) hours as needed for wheezing or shortness of breath.     alendronate (FOSAMAX) 70 MG tablet Take by mouth.     ARIPiprazole (ABILIFY) 2 MG tablet TAKE 1 TABLET(2 MG) BY MOUTH DAILY 90 tablet 0   azelastine (ASTELIN) 0.1 % nasal spray Place 1 spray into both nostrils daily as needed for allergies.     cetirizine (ZYRTEC) 10 MG tablet Take 10 mg by mouth daily.     docusate sodium (COLACE) 100 MG capsule Take 1 capsule (100 mg total) by mouth 2 (two) times daily. 10 capsule 0   Dulaglutide 4.5 MG/0.5ML SOPN Inject 4.5 mg into the skin once a week.     ezetimibe (ZETIA) 10 MG tablet Take 1 tablet by mouth daily.     gabapentin (NEURONTIN) 100 MG capsule Take 200 mg by mouth at bedtime.     glipiZIDE (GLUCOTROL XL) 10 MG 24 hr tablet Take 20 mg by mouth daily with breakfast.     glucose blood test strip 1 each by Other route. Use as instructed     HYDROcodone-acetaminophen (NORCO/VICODIN) 5-325 MG tablet Take 1-2 tablets by mouth every 4 (four) hours as needed for moderate pain (pain score 4-6). 30 tablet 0   Insulin Pen Needle (PEN NEEDLES 31GX5/16") 31G X 8 MM MISC See admin instructions.     LANTUS SOLOSTAR 100 UNIT/ML Solostar Pen Inject 28 Units into the skin at bedtime.     liraglutide (VICTOZA) 18 MG/3ML SOPN Inject into the skin.     methocarbamol (ROBAXIN) 500 MG tablet Take 1 tablet (500 mg total) by mouth every 6 (six) hours as needed for muscle spasms. 30 tablet 0   metoprolol succinate (TOPROL-XL) 25 MG 24 hr tablet 25 mg daily.     Multiple Vitamin (MULTI-VITAMIN) tablet Take 1 tablet by mouth daily.     omeprazole  (PRILOSEC) 40 MG capsule Take 40 mg by mouth daily.     oxybutynin (DITROPAN-XL) 5 MG 24 hr tablet Take 5 mg by mouth daily.     pioglitazone (ACTOS) 15 MG tablet Take 15 mg by mouth daily.     polyethylene glycol (MIRALAX / GLYCOLAX) 17 g packet Take 17 g by mouth daily as needed for mild constipation. 14 each 0   polyethylene glycol powder (GLYCOLAX/MIRALAX) 17 GM/SCOOP powder Take by mouth.     rizatriptan (MAXALT) 10 MG tablet Take by mouth.     traMADol (ULTRAM) 50 MG tablet Take 1 tablet (50 mg total) by mouth every 6 (six) hours as needed. 30 tablet  0   venlafaxine XR (EFFEXOR-XR) 150 MG 24 hr capsule Take 1 capsule (150 mg total) by mouth daily with breakfast. To be combined with 75 mg daily 90 capsule 0   venlafaxine XR (EFFEXOR-XR) 75 MG 24 hr capsule Take 1 capsule (75 mg total) by mouth daily with breakfast. To be combined with 150 mg daily 90 capsule 0   VICTOZA 18 MG/3ML SOPN Inject into the skin.     enoxaparin (LOVENOX) 40 MG/0.4ML injection Inject 0.4 mLs (40 mg total) into the skin daily for 14 days. 5.6 mL 0   linaclotide (LINZESS) 145 MCG CAPS capsule Take 145 mcg by mouth every other day.     lisinopril (ZESTRIL) 2.5 MG tablet Take by mouth. (Patient not taking: No sig reported)     zonisamide (ZONEGRAN) 100 MG capsule Take 100 mg by mouth daily.     No current facility-administered medications for this visit.     Musculoskeletal: Strength & Muscle Tone: within normal limits Gait & Station: normal Patient leans: N/A  Psychiatric Specialty Exam: Review of Systems  Psychiatric/Behavioral:  Negative for agitation, behavioral problems, confusion, decreased concentration, dysphoric mood, hallucinations, self-injury, sleep disturbance and suicidal ideas. The patient is not nervous/anxious and is not hyperactive.   All other systems reviewed and are negative.  Blood pressure 132/65, pulse 96, temperature 97.9 F (36.6 C), temperature source Oral, weight 220 lb (99.8  kg).Body mass index is 40.24 kg/m.  General Appearance: Casual  Eye Contact:  Fair  Speech:  Clear and Coherent  Volume:  Normal  Mood:  Euthymic  Affect:  Congruent  Thought Process:  Goal Directed and Descriptions of Associations: Intact  Orientation:  Full (Time, Place, and Person)  Thought Content: Logical   Suicidal Thoughts:  No  Homicidal Thoughts:  No  Memory:  Immediate;   Fair Recent;   Fair Remote;   Fair  Judgement:  Fair  Insight:  Fair  Psychomotor Activity:  Normal  Concentration:  Concentration: Fair and Attention Span: Fair  Recall:  [employer]  Fund of Knowledge: Fair  Language: Fair  Akathisia:  No  Handed:  Right  AIMS (if indicated): done  Assets:  Communication Skills Desire for Improvement Housing Social Support  ADL's:  Intact  Cognition: WNL  Sleep:  Fair   Screenings: AIMS    Flowsheet Row [facility] Office Visit from [facility]04[facility]2021 [facility] Office Visit [facility]  AIMS Total Score 0 0 0      GAD-7    Flowsheet Row [facility] Office Visit from [facility]2020 [facility] Video Visit from 10/01/2020 [facility]  Total GAD-7 Score 0 0 0      PHQ2-9    Flowsheet Row [facility] Office Visit [facility] Office Visit from [facility]04/2021 [facility] Office [facility] Video Visit from [facility]  PHQ-2 Total Score 0 1 0 0 0  PHQ-9 Total Score 2 -- -- -- --      Flowsheet Row Admission (Discharged) from [facility] (1A)  Pre-Admission Testing 60 from 04/14/2021 in [facility] PRE ADMISSION TESTING Office Visit from [facility]/04[facility]20[facility]  C-SSRS RISK CATEGORY No  Risk No Risk No Risk        Assessment and Plan: Margaree Sandhu is a 71 year old Caucasian female, widowed, lives in [place], has a history of schizoaffective disorder, recent left knee total replacement surgery was evaluated in office today.  Patient is currently stable.  Plan Schizoaffective disorder-stable Abilify 2 mg p.o. daily Effexor extended release 225 mg p.o. daily  Bereavement-stable Patient is currently coping better.  Follow-up in clinic in 3 months or sooner if needed.  This note was generated in part or whole with voice recognition software. Voice recognition is usually quite accurate but there are transcription errors that can and very often do occur. I apologize for any typographical errors that were not detected and corrected.        Jomarie Longs, MD 09/11/2021, 8:24 AM

## 2021-12-15 ENCOUNTER — Ambulatory Visit (INDEPENDENT_AMBULATORY_CARE_PROVIDER_SITE_OTHER): Payer: Medicare PPO | Admitting: Psychiatry

## 2021-12-15 ENCOUNTER — Encounter: Payer: Self-pay | Admitting: Psychiatry

## 2021-12-15 VITALS — BP 122/63 | HR 85 | Temp 98.5°F | Wt 223.2 lb

## 2021-12-15 DIAGNOSIS — F4381 Prolonged grief disorder: Secondary | ICD-10-CM

## 2021-12-15 DIAGNOSIS — F251 Schizoaffective disorder, depressive type: Secondary | ICD-10-CM | POA: Diagnosis not present

## 2021-12-15 MED ORDER — CLONAZEPAM 0.5 MG PO TABS: 0.5000 mg | ORAL_TABLET | ORAL | 1 refills | Status: DC

## 2021-12-15 MED ORDER — ARIPIPRAZOLE 2 MG PO TABS: ORAL_TABLET | ORAL | 0 refills | Status: DC

## 2021-12-15 MED ORDER — VENLAFAXINE HCL ER 150 MG PO CP24: 150.0000 mg | ORAL_CAPSULE | Freq: Every day | ORAL | 0 refills | Status: DC

## 2021-12-15 MED ORDER — VENLAFAXINE HCL ER 75 MG PO CP24: 75.0000 mg | ORAL_CAPSULE | Freq: Every day | ORAL | 0 refills | Status: DC

## 2021-12-15 NOTE — Progress Notes (Unsigned)
BH MD OP Progress Note  12/15/2021 3:11 PM Judith [name]  MRN:  [id number]  Chief Complaint:  Chief Complaint  Patient presents with   Follow-up: 71 year old Caucasian female who has a history of schizoaffective disorder, obstructive sleep apnea on CPAP, bereavement, presented for medication management.   HPI: Judith [name] is a 71 year old Caucasian female, widowed, lives in [place], currently on SSI, has a history of schizoaffective disorder, obstructive sleep apnea on CPAP, diabetes mellitus, hyperlipidemia, fibromyalgia, history of multiple surgeries was evaluated in office today.  Patient today reports she is currently grieving her husband who passed away 7 to 8 years ago.  Patient reports anything including a song triggers her memories.  Patient became tearful when she discussed this.  Patient reports she does have good support system especially from a friend and she talks to her.  She also has dogs at home and that is therapeutic for her.  Has not been following up with a therapist since she relocated to [place].  However motivated to call her to schedule a virtual appointment.  Patient otherwise denies any significant depressive symptoms.  Denies any sleep problems.  Denies any significant anxiety, worrying.  Denies any side effects to medications and reports she is compliant.  Denies any suicidality, homicidality or perceptual disturbances.  Patient denies any other concerns today.  Visit Diagnosis:    ICD-10-CM   1. 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  Past Medical History:  Past Medical History:  Diagnosis Date   Anginal pain (HCC)     Arthritis    Asthma    Depression    Diabetes mellitus without complication (HCC)    Dysrhythmia    Fibromyalgia    Gastroparesis    GERD (gastroesophageal reflux disease)    Headache    Sleep apnea     Past Surgical History:  Procedure Laterality Date   ABDOMINAL HYSTERECTOMY     BLADDER REPAIR     CATARACT EXTRACTION Bilateral 02/08/2013   CHOLECYSTECTOMY     ESOPHAGOGASTRODUODENOSCOPY (EGD) WITH PROPOFOL N/A 04/01/2016   Procedure: ESOPHAGOGASTRODUODENOSCOPY (EGD) WITH PROPOFOL;  Surgeon: Scot Jun, MD;  Location: [facility];  Service: Endoscopy;  Laterality: N/A;   rotator cuff surgery Right    TOTAL KNEE ARTHROPLASTY Left 04/24/2021   Procedure: TOTAL KNEE ARTHROPLASTY;  Surgeon: Kennedy Bucker, MD;  Location: [facility] ORS;  Service: Orthopedics;  Laterality: Left;   TUBAL LIGATION      Family Psychiatric History: Reviewed family psychiatric history from progress note on 01/29/2020.  Family History:  Family History  Problem Relation Age of Onset   Bipolar disorder Sister    Bipolar disorder Brother    Breast cancer Neg Hx     Social History: Reviewed social history from progress note on 01/29/2020. Social History   Socioeconomic History   Marital status: Widowed    Spouse name: Not on file   Number of children: 2   Years of education: Not on file   Highest education level: Not on file  Occupational History   Not on file  Tobacco Use   Smoking status: Never   Smokeless tobacco: Never  Vaping Use   Vaping  Use: Never used  Substance and Sexual Activity   Alcohol use: Yes    Comment: averages lessthan 1 drink per week   Drug use: No   Sexual activity: Never  Other Topics Concern   Not on file  Social History Narrative   Retired   International aid/development worker of [employer] Strain: Not on file  Food Insecurity: Not on file  Transportation Needs: Not on file  Physical Activity: Not on file  Stress: Not on file  Social Connections: Not  on file    Allergies:  Allergies  Allergen Reactions   Pravastatin Other (See Comments)    Muscle pain   Topiramate Other (See Comments)   Metformin Nausea Only    Metabolic Disorder Labs: No results found for: "HGBA1C", "MPG" Lab Results  Component Value Date   PROLACTIN 10.4 03/04/2021   No results found for: "CHOL", "TRIG", "HDL", "CHOLHDL", "VLDL", "LDLCALC" No results found for: "TSH"  Therapeutic Level Labs: No results found for: "LITHIUM" No results found for: "VALPROATE" No results found for: "CBMZ"  Current Medications: Current Outpatient Medications  Medication Sig Dispense Refill   albuterol (PROVENTIL HFA;VENTOLIN HFA) 108 (90 Base) MCG/ACT inhaler Inhale 2 puffs into the lungs every 6 (six) hours as needed for wheezing or shortness of breath.     alendronate (FOSAMAX) 70 MG tablet Take by mouth.     azelastine (ASTELIN) 0.1 % nasal spray Place 1 spray into both nostrils daily as needed for allergies.     cetirizine (ZYRTEC) 10 MG tablet Take 10 mg by mouth daily.     clonazePAM (KLONOPIN) 0.5 MG tablet Take 1 tablet (0.5 mg total) by mouth as directed. Take 1-2 tablets once a week as needed for severe anxiety, grief reaction 8 tablet 1   Dulaglutide 4.5 MG/0.5ML SOPN Inject 4.5 mg into the skin once a week.     ezetimibe (ZETIA) 10 MG tablet Take 1 tablet by mouth daily.     gabapentin (NEURONTIN) 100 MG capsule Take 200 mg by mouth at bedtime.     glipiZIDE (GLUCOTROL XL) 10 MG 24 hr tablet Take 20 mg by mouth daily with breakfast.     glucose blood test strip 1 each by Other route. Use as instructed     Insulin Pen Needle (PEN NEEDLES 31GX5/16") 31G X 8 MM MISC See admin instructions.     LANTUS SOLOSTAR 100 UNIT/ML Solostar Pen Inject 28 Units into the skin at bedtime.     methocarbamol (ROBAXIN) 500 MG tablet Take 1 tablet (500 mg total) by mouth every 6 (six) hours as needed for muscle spasms. 30 tablet 0   metoprolol succinate (TOPROL-XL) 25 MG 24 hr  tablet 25 mg daily.     Multiple Vitamin (MULTI-VITAMIN) tablet Take 1 tablet by mouth daily.     omeprazole (PRILOSEC) 40 MG capsule Take 40 mg by mouth daily.     oxybutynin (DITROPAN-XL) 5 MG 24 hr tablet Take 5 mg by mouth daily.     pioglitazone (ACTOS) 15 MG tablet Take 15 mg by mouth daily.     rizatriptan (MAXALT) 10 MG tablet Take by mouth.     TRULICITY 3 MG/0.5ML SOPN Inject into the skin.     ARIPiprazole (ABILIFY) 2 MG tablet TAKE 1 TABLET(2 MG) BY MOUTH DAILY 90 tablet 0   enoxaparin (LOVENOX) 40 MG/0.4ML injection Inject 0.4 mLs (40 mg total) into the skin daily for 14 days. 5.6 mL 0   linaclotide (LINZESS) 145 MCG  CAPS capsule Take 145 mcg by mouth every other day.     polyethylene glycol (MIRALAX / GLYCOLAX) 17 g packet Take 17 g by mouth daily as needed for mild constipation. (Patient not taking: Reported on 12/15/2021) 14 each 0   polyethylene glycol powder (GLYCOLAX/MIRALAX) 17 GM/SCOOP powder Take by mouth. (Patient not taking: Reported on 12/15/2021)     venlafaxine XR (EFFEXOR-XR) 150 MG 24 hr capsule Take 1 capsule (150 mg total) by mouth daily with breakfast. To be combined with 75 mg daily 90 capsule 0   venlafaxine XR (EFFEXOR-XR) 75 MG 24 hr capsule Take 1 capsule (75 mg total) by mouth daily with breakfast. To be combined with 150 mg daily 90 capsule 0   No current facility-administered medications for this visit.     Musculoskeletal: Strength & Muscle Tone: within normal limits Gait & Station: normal Patient leans: N/A  Psychiatric Specialty Exam: Review of Systems  Psychiatric/Behavioral:         Grieving  All other systems reviewed and are negative.   Blood pressure 122/63, pulse 85, temperature 98.5 F (36.9 C), temperature source Temporal, weight 223 lb 3.2 oz (101.2 kg).Body mass index is 40.82 kg/m.  General Appearance: Casual  Eye Contact:  Fair  Speech:  Clear and Coherent  Volume:  Normal  Mood:   Grieving  Affect:  Tearful  Thought Process:   Goal Directed and Descriptions of Associations: Intact  Orientation:  Full (Time, Place, and Person)  Thought Content: Logical   Suicidal Thoughts:  No  Homicidal Thoughts:  No  Memory:  Immediate;   Fair Recent;   Fair Remote;   Fair  Judgement:  Fair  Insight:  Fair  Psychomotor Activity:  Normal  Concentration:  Concentration: Fair and Attention Span: Fair  Recall:  [employer] of Knowledge: Fair  Language: Fair  Akathisia:  No  Handed:  Right  AIMS (if indicated): done  Assets:  Communication Skills Desire for Improvement Housing Social Support  ADL's:  Intact  Cognition: WNL  Sleep:  Fair   Screenings: AIMS    Flowsheet Row Office Visit from 12/15/2021 in [facility] Office Visit from 09/10/2021 in [facility] Office Visit from 06/04/2021 in [facility] Office Visit from 12/31/2020 in [facility]  AIMS Total Score 0 0 0 0      GAD-7    Flowsheet Row Office Visit from 12/15/2021 in [facility] Office Visit from 06/04/2021 in [facility] Office Visit from 12/31/2020 in [facility] Video Visit from 10/01/2020 in [facility]  Total GAD-7 Score 4 0 0 0      PHQ2-9    Flowsheet Row Office Visit from 12/15/2021 in [facility] Office Visit from 09/10/2021 in [facility] Office Visit from 06/04/2021 in [facility] Office Visit from 03/04/2021 in [facility] Office Visit from 12/31/2020 in [facility]  PHQ-2 Total Score 3 0 1 0 0  PHQ-9 Total Score 4 2 -- -- --      Flowsheet Row Admission (Discharged) from 04/24/2021 in [facility] ([facility]) Pre-Admission Testing 60 from 04/14/2021 in [facility] Office Visit from 03/04/2021 in [facility]  C-SSRS RISK CATEGORY No Risk No Risk No Risk        Assessment and Plan: Judith [name] is a 71 year old  Caucasian female, widowed, lives in [place], has a history of schizoaffective disorder, left knee total replacement surgery was evaluated in office today.  Patient is currently grieving her spouse who passed away several years ago, will benefit from continued psychotherapy sessions as well as medication readjustments as noted below.  Plan Schizoaffective disorder-stable Abilify 2 mg p.o. daily Effexor extended release 225 mg p.o. daily  Bereavement, prolonged-unstable Start Klonopin 0.5 mg 1-2 times a week only for severe grief reaction.  Patient advised to limit use.  Provided education about benzodiazepine therapy. Patient advised to restart CBT with her therapist. Reviewed [place] PMP aware.  Follow-up in clinic in 6 to 8 weeks or sooner if needed.  This note was generated in part or whole with voice recognition software. Voice recognition is usually quite accurate but there are transcription errors that can and very often do occur. I apologize for any typographical errors that were not detected and corrected.      Jomarie Longs, MD 12/16/2021, 12:52 PM

## 2021-12-15 NOTE — Patient Instructions (Signed)
Clonazepam Tablets What is this medication? CLONAZEPAM (kloe NA ze pam) treats seizures. It may also be used to treat panic disorder. It works by helping your nervous system calm down. It belongs to a group of medications called benzodiazepines. This medicine may be used for other purposes; ask your health care provider or pharmacist if you have questions. COMMON BRAND NAME(S): Ceberclon, Klonopin What should I tell my care team before I take this medication? They need to know if you have any of these conditions: An alcohol or drug abuse problem Bipolar disorder, depression, psychosis or other mental health condition Glaucoma Kidney or liver disease Lung or breathing disease Myasthenia gravis Parkinson disease Porphyria Seizures or a history of seizures Suicidal thoughts An unusual or allergic reaction to clonazepam, other benzodiazepines, foods, dyes, or preservatives Pregnant or trying to get pregnant Breast-feeding How should I use this medication? Take this medication by mouth with a glass of water. Follow the directions on the prescription label. If it upsets your stomach, take it with food or milk. Take your medication at regular intervals. Do not take it more often than directed. Do not stop taking or change the dose except on the advice of your care team. A special MedGuide will be given to you by the pharmacist with each prescription and refill. Be sure to read this information carefully each time. Talk to your care team regarding the use of this medication in children. Special care may be needed. Overdosage: If you think you have taken too much of this medicine contact a poison control center or emergency room at once. NOTE: This medicine is only for you. Do not share this medicine with others. What if I miss a dose? If you miss a dose, take it as soon as you can. If it is almost time for your next dose, take only that dose. Do not take double or extra doses. What may interact  with this medication? Do not take this medication with any of the following: Narcotic medications for cough Sodium oxybate This medication may also interact with the following: Alcohol Antihistamines for allergy, cough and cold Antiviral medications for HIV or AIDS Certain medications for anxiety or sleep Certain medications for depression, like amitriptyline, fluoxetine, sertraline Certain medications for fungal infections like ketoconazole and itraconazole Certain medications for seizures like carbamazepine, phenobarbital, phenytoin, primidone General anesthetics like halothane, isoflurane, methoxyflurane, propofol Local anesthetics like lidocaine, pramoxine, tetracaine Medications that relax muscles for surgery Narcotic medications for pain Phenothiazines like chlorpromazine, mesoridazine, prochlorperazine, thioridazine This list may not describe all possible interactions. Give your health care provider a list of all the medicines, herbs, non-prescription drugs, or dietary supplements you use. Also tell them if you smoke, drink alcohol, or use illegal drugs. Some items may interact with your medicine. What should I watch for while using this medication? Tell your care team if your symptoms do not start to get better or if they get worse. Do not stop taking except on your care team's advice. You may develop a severe reaction. Your care team will tell you how much medication to take. You may get drowsy or dizzy. Do not drive, use machinery, or do anything that needs mental alertness until you know how this medication affects you. To reduce the risk of dizzy and fainting spells, do not stand or sit up quickly, especially if you are an older patient. Alcohol may increase dizziness and drowsiness. Avoid alcoholic drinks. If you are taking another medication that also causes drowsiness, you may   have more side effects. Give your care team a list of all medications you use. Your care team will tell  you how much medication to take. Do not take more medication than directed. Call emergency services if you have problems breathing or unusual sleepiness. The use of this medication may increase the chance of suicidal thoughts or actions. Pay special attention to how you are responding while on this medication. Any worsening of mood, or thoughts of suicide or dying should be reported to your care team right away. What side effects may I notice from receiving this medication? Side effects that you should report to your care team as soon as possible: Allergic reactions--skin rash, itching, hives, swelling of the face, lips, tongue, or throat CNS depression--slow or shallow breathing, shortness of breath, feeling faint, dizziness, confusion, trouble staying awake Thoughts of suicide or self-harm, worsening mood, feelings of depression Side effects that usually do not require medical attention (report to your care team if they continue or are bothersome): Dizziness Drowsiness Headache This list may not describe all possible side effects. Call your doctor for medical advice about side effects. You may report side effects to FDA at 1-800-FDA-1088. Where should I keep my medication? Keep out of the reach of children and pets. This medication can be abused. Keep your medication in a safe place to protect it from theft. Do not share this medication with anyone. Selling or giving away this medication is dangerous and against the law. Store at room temperature between 15 and 30 degrees C (59 and 86 degrees F). Protect from light. Keep container tightly closed. This medication may cause accidental overdose and death if taken by other adults, children, or pets. Mix any unused medication with a substance like cat litter or coffee grounds. Then throw the medication away in a sealed container like a sealed bag or a coffee can with a lid. Do not use the medication after the expiration date. NOTE: This sheet is a  summary. It may not cover all possible information. If you have questions about this medicine, talk to your doctor, pharmacist, or health care provider.  2023 Elsevier/Gold Standard (2007-06-04 00:00:00)  

## 2022-01-07 ENCOUNTER — Other Ambulatory Visit: Payer: Self-pay | Admitting: Family Medicine

## 2022-01-07 DIAGNOSIS — Z1231 Encounter for screening mammogram for malignant neoplasm of breast: Secondary | ICD-10-CM

## 2022-01-09 ENCOUNTER — Ambulatory Visit
Admission: RE | Admit: 2022-01-09 | Discharge: 2022-01-09 | Disposition: A | Discharge status: Home / Self Care | Payer: Medicare PPO | Source: Ambulatory Visit | Attending: Family Medicine | Admitting: Family Medicine

## 2022-01-09 DIAGNOSIS — Z1231 Encounter for screening mammogram for malignant neoplasm of breast: Secondary | ICD-10-CM

## 2022-01-27 ENCOUNTER — Ambulatory Visit: Payer: Medicare PPO | Admitting: Psychiatry

## 2022-01-28 IMAGING — CT CT KNEE*L* W/O CM
4 of 8 series · 12 of 33 positions shown, 13 images · non-contrast
Comparison: None.

CLINICAL DATA: Preop planning, left knee pain

EXAM:
CT OF THE left KNEE WITHOUT CONTRAST
TECHNIQUE: Multidetector CT imaging of the left knee was performed according to
the standard protocol. Multiplanar CT image reconstructions were
also generated.

[Series 3: axial bone knee 2.00 · axial · 0.34mm/px · z∈[+909,+1069]mm · 3 of 160 slices shown, 4 images]
[im 40/160  soft-tissue]
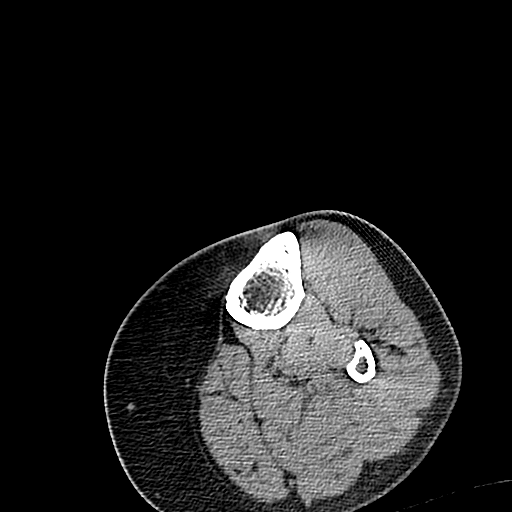
[im 40/160  bone]
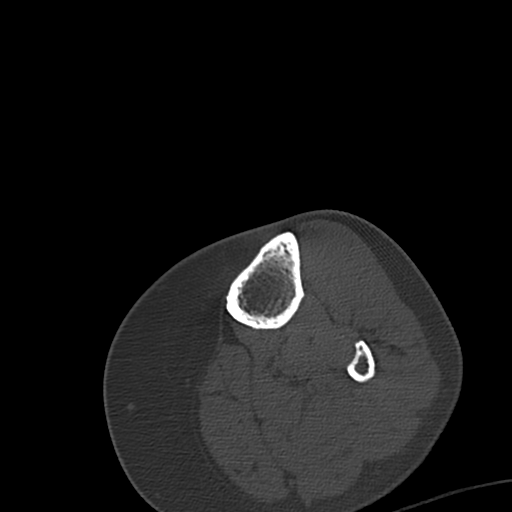
[im 80/160  bone]
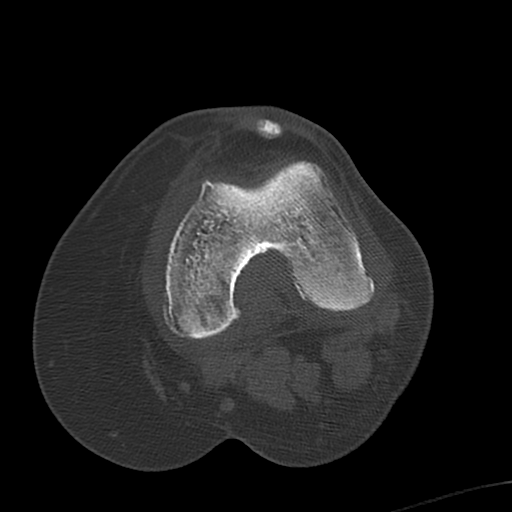
[im 120/160  bone]
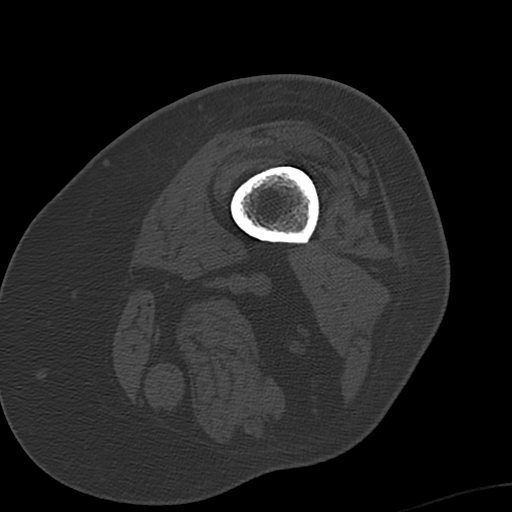

[Series 4: axial st knee 2.00 · axial · 0.34mm/px · z∈[+909,+1069]mm · 3 of 160 slices shown]
[im 40/160  bone]
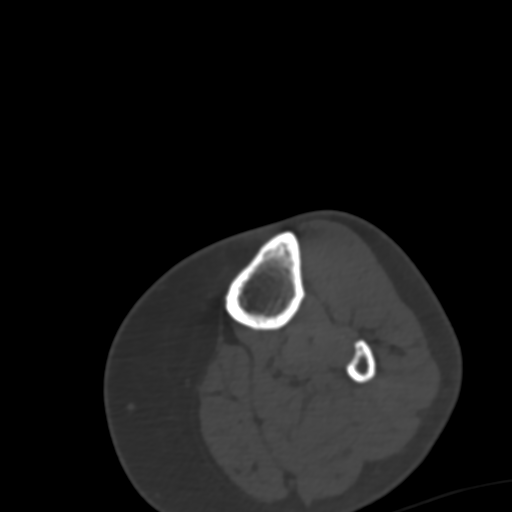
[im 80/160  bone]
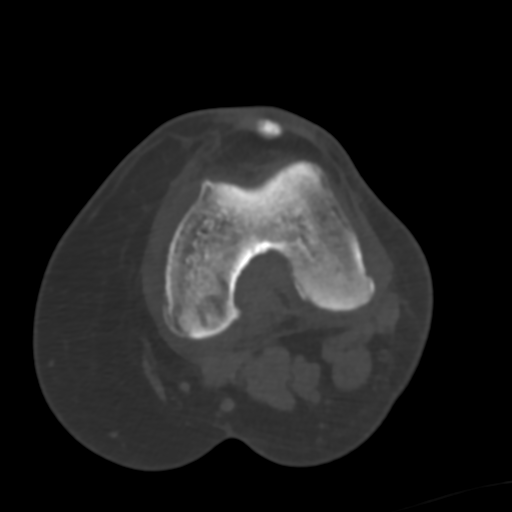
[im 120/160  bone]
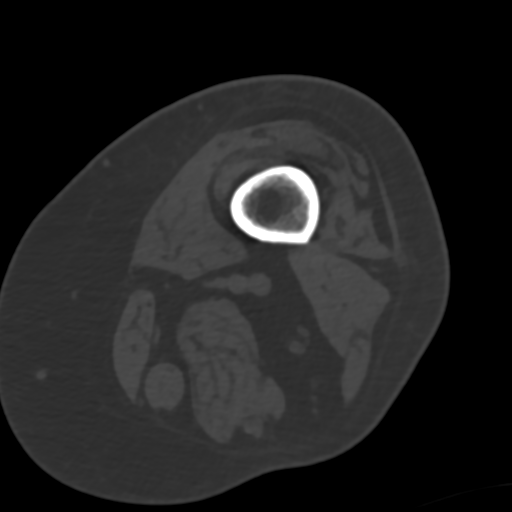

[Series 6: coronal bone knee 2.00 cor · coronal · 0.30mm/px · 1 of 83 slices shown]
[im 42/83  bone]
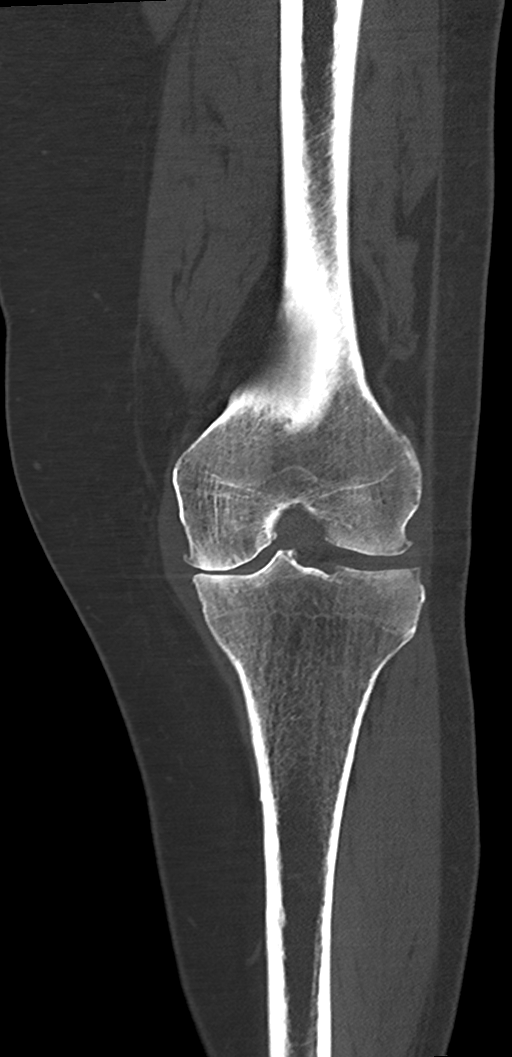

[Series 10: sagittal bone knee 2.00 sag · sagittal · 0.33mm/px · 5 of 77 slices shown]
[im 13/77  bone]
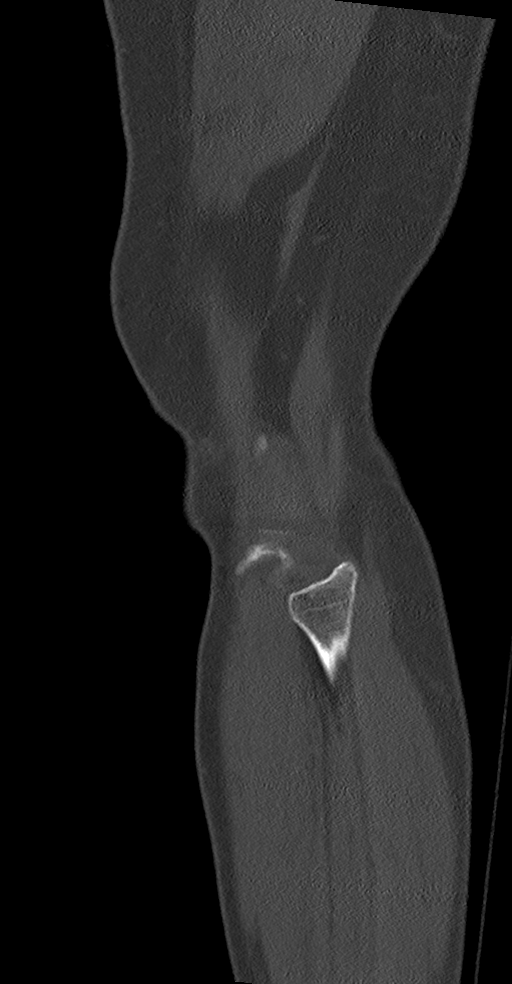
[im 26/77  bone]
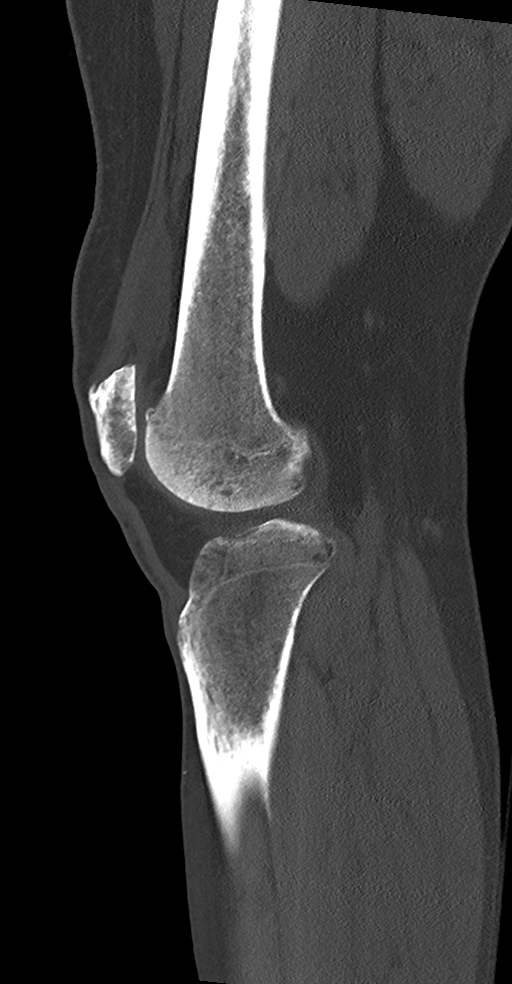
[im 39/77  bone]
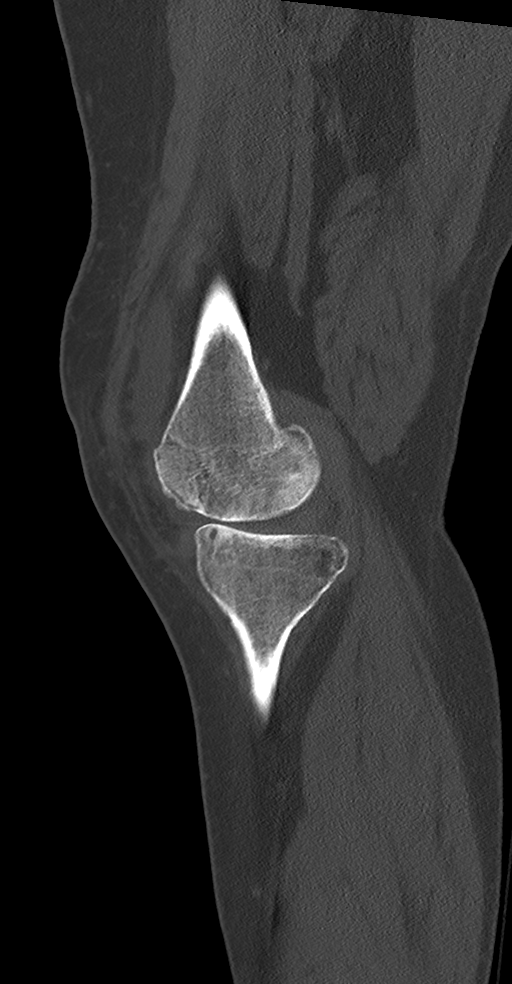
[im 51/77  bone]
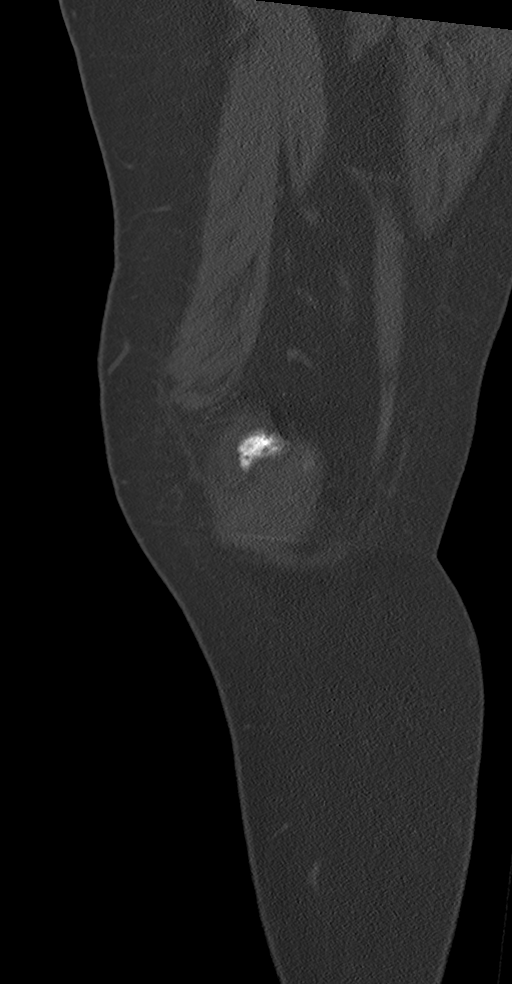
[im 64/77  bone]
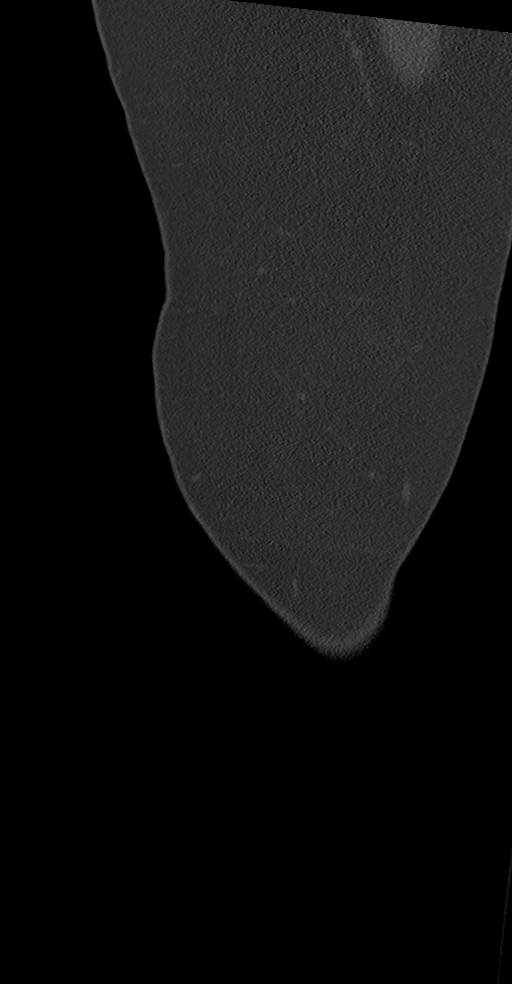

[12 of 33 positions shown; findings below may reference images not displayed]

FINDINGS: Bones/Joint/Cartilage

The left knee demonstrates tricompartment osteophyte formation with
severe medial compartment joint space narrowing, subchondral
sclerosis and cystic change. There is a small left joint effusion.
Tiny suprapatellar enthesophyte. The medial meniscus is extruded
likely reflecting a meniscus tear, poorly visualized by CT.

Limited axial images of the left hip demonstrate mild osteoarthritis
and no acute osseous abnormality. Mild left SI joint degenerative
change.

Limited axial images of the left ankle demonstrate no acute osseous
abnormality.

Ligaments

Suboptimally assessed by CT.

Muscles and Tendons

No significant muscle atrophy.  No intramuscular collection.

Soft tissues

No focal fluid collection.
IMPRESSION: Tricompartment osteoarthritis of the left knee, severe in the medial
compartment. Small left knee joint effusion.

## 2022-02-04 ENCOUNTER — Other Ambulatory Visit
Admission: RE | Admit: 2022-02-04 | Discharge: 2022-02-04 | Disposition: A | Discharge status: Home / Self Care | Payer: Medicare PPO | Source: Ambulatory Visit | Attending: Ophthalmology | Admitting: Ophthalmology

## 2022-02-04 DIAGNOSIS — H532 Diplopia: Secondary | ICD-10-CM | POA: Diagnosis present

## 2022-02-04 DIAGNOSIS — G7000 Myasthenia gravis without (acute) exacerbation: Secondary | ICD-10-CM | POA: Insufficient documentation

## 2022-02-05 LAB — ACETYLCHOLINE RECEPTOR, BINDING: Acety choline binding ab: 0.03 nmol/L (ref 0.00–0.24)

## 2022-03-04 ENCOUNTER — Encounter: Payer: Self-pay | Admitting: Psychiatry

## 2022-03-04 ENCOUNTER — Ambulatory Visit (INDEPENDENT_AMBULATORY_CARE_PROVIDER_SITE_OTHER): Payer: Medicare PPO | Admitting: Psychiatry

## 2022-03-04 VITALS — BP 113/76 | HR 89 | Temp 98.7°F | Ht 62.0 in | Wt 224.2 lb

## 2022-03-04 DIAGNOSIS — F251 Schizoaffective disorder, depressive type: Secondary | ICD-10-CM | POA: Diagnosis not present

## 2022-03-04 DIAGNOSIS — F4381 Prolonged grief disorder: Secondary | ICD-10-CM | POA: Diagnosis not present

## 2022-03-04 MED ORDER — VENLAFAXINE HCL ER 75 MG PO CP24: 75.0000 mg | ORAL_CAPSULE | Freq: Every day | ORAL | 0 refills | Status: DC

## 2022-03-04 MED ORDER — VENLAFAXINE HCL ER 150 MG PO CP24: 150.0000 mg | ORAL_CAPSULE | Freq: Every day | ORAL | 0 refills | Status: DC

## 2022-03-04 MED ORDER — ARIPIPRAZOLE 2 MG PO TABS: ORAL_TABLET | ORAL | 0 refills | Status: DC

## 2022-03-04 NOTE — Progress Notes (Signed)
BH MD OP Progress Note  03/04/2022 2:27 PM Moe Brier  MRN:  [id number]  Chief Complaint:  Chief Complaint  Patient presents with   Follow-up   Anxiety   Depression   Medication Refill   HPI: Judith [name] is a 71 year old Caucasian female, widowed, lives in [place], currently on SSI, has a history of schizoaffective disorder, obstructive sleep apnea on CPAP, diabetes mellitus, hyperlipidemia, fibromyalgia, history of multiple surgeries was evaluated in office today.  Patient continues to grieve the loss of her husband and her grandson.  Husband passed away several years ago, grandson passed away in a motor vehicle accident in 2021.  Patient reports she is also sad about the fact that her sister who is 79 is currently admitted to a hospital for medical problems.  That worries her.  Patient reports however she has an appointment coming up with therapist tomorrow, Ms. Christina Hussami, looks forward to that.  Patient reports she does not have any significant anhedonia, low motivation or sleep problems.  Current medications are beneficial.  Denies suicidality, homicidality or perceptual disturbances.  Currently compliant on medications, denies side effects.  Patient denies any other concerns today.  Visit Diagnosis:    ICD-10-CM   1. 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  Past Medical History:  Past Medical History:  Diagnosis Date   Anginal pain (HCC)    Arthritis    Asthma    Depression    Diabetes mellitus without complication (HCC)    Dysrhythmia    Fibromyalgia    Gastroparesis    GERD (gastroesophageal reflux disease)    Headache    Sleep apnea     Past Surgical  History:  Procedure Laterality Date   ABDOMINAL HYSTERECTOMY     BLADDER REPAIR     CATARACT EXTRACTION Bilateral 02/08/2013   CHOLECYSTECTOMY     ESOPHAGOGASTRODUODENOSCOPY (EGD) WITH PROPOFOL N/A 04/01/2016   Procedure: ESOPHAGOGASTRODUODENOSCOPY (EGD) WITH PROPOFOL;  Surgeon: Scot Jun, MD;  Location: [facility] ENDOSCOPY;  Service: Endoscopy;  Laterality: N/A;   rotator cuff surgery Right    TOTAL KNEE ARTHROPLASTY Left 04/24/2021   Procedure: TOTAL KNEE ARTHROPLASTY;  Surgeon: Kennedy Bucker, MD;  Location: [facility] ORS;  Service: Orthopedics;  Laterality: Left;   TUBAL LIGATION      Family Psychiatric History: Reviewed family psychiatric history from progress note on 01/29/2020.  Family History:  Family History  Problem Relation Age of Onset   Bipolar disorder Sister    Bipolar disorder Brother    Breast cancer Neg Hx     Social History: Reviewed social history from progress note on 01/29/2020. Social History   Socioeconomic History   Marital status: Widowed    Spouse name: Not on file   Number of children: 2   Years of education: Not on file   Highest education level: Not on file  Occupational History   Not on file  Tobacco Use   Smoking status: Never   Smokeless tobacco: Never  Vaping Use   Vaping Use: Never used  Substance and Sexual Activity   Alcohol use: Yes    Comment: averages lessthan 1 drink per week   Drug use: No   Sexual  activity: Never  Other Topics Concern   Not on file  Social History Narrative   Retired   International aid/development worker of [employer] Strain: Not on file  Food Insecurity: Not on file  Transportation Needs: Not on file  Physical Activity: Not on file  Stress: Not on file  Social Connections: Not on file    Allergies:  Allergies  Allergen Reactions   Pravastatin Other (See Comments)    Muscle pain   Topiramate Other (See Comments)   Metformin Nausea Only    Metabolic Disorder Labs: No results found for:  "HGBA1C", "MPG" Lab Results  Component Value Date   PROLACTIN 10.4 03/04/2021   No results found for: "CHOL", "TRIG", "HDL", "CHOLHDL", "VLDL", "LDLCALC" No results found for: "TSH"  Therapeutic Level Labs: No results found for: "LITHIUM" No results found for: "VALPROATE" No results found for: "CBMZ"  Current Medications: Current Outpatient Medications  Medication Sig Dispense Refill   albuterol (PROVENTIL HFA;VENTOLIN HFA) 108 (90 Base) MCG/ACT inhaler Inhale 2 puffs into the lungs every 6 (six) hours as needed for wheezing or shortness of breath.     alendronate (FOSAMAX) 70 MG tablet Take by mouth.     azelastine (ASTELIN) 0.1 % nasal spray Place 1 spray into both nostrils daily as needed for allergies.     Baclofen 5 MG TABS Take by mouth daily.     cetirizine (ZYRTEC) 10 MG tablet Take 10 mg by mouth daily.     Dulaglutide 4.5 MG/0.5ML SOPN Inject 4.5 mg into the skin once a week.     ezetimibe (ZETIA) 10 MG tablet Take 1 tablet by mouth daily.     glipiZIDE (GLUCOTROL XL) 10 MG 24 hr tablet Take 20 mg by mouth daily with breakfast.     glucose blood test strip 1 each by Other route. Use as instructed     Insulin Pen Needle (PEN NEEDLES 31GX5/16") 31G X 8 MM MISC See admin instructions.     LANTUS SOLOSTAR 100 UNIT/ML Solostar Pen Inject 28 Units into the skin at bedtime.     metoprolol succinate (TOPROL-XL) 25 MG 24 hr tablet 25 mg daily.     Multiple Vitamin (MULTI-VITAMIN) tablet Take 1 tablet by mouth daily.     omeprazole (PRILOSEC) 40 MG capsule Take 40 mg by mouth daily.     oxybutynin (DITROPAN-XL) 5 MG 24 hr tablet Take 5 mg by mouth daily.     pioglitazone (ACTOS) 15 MG tablet Take 15 mg by mouth daily.     primidone (MYSOLINE) 50 MG tablet Take by mouth 4 (four) times daily.     rizatriptan (MAXALT) 10 MG tablet Take by mouth.     SPIKEVAX 50 MCG/0.5ML SUSY Inject into the muscle.     ARIPiprazole (ABILIFY) 2 MG tablet TAKE 1 TABLET(2 MG) BY MOUTH DAILY 90 tablet 0    clonazePAM (KLONOPIN) 0.5 MG tablet Take 1 tablet (0.5 mg total) by mouth as directed. Take 1-2 tablets once a week as needed for severe anxiety, grief reaction (Patient not taking: Reported on 03/04/2022) 8 tablet 1   linaclotide (LINZESS) 145 MCG CAPS capsule Take 145 mcg by mouth every other day.     venlafaxine XR (EFFEXOR-XR) 150 MG 24 hr capsule Take 1 capsule (150 mg total) by mouth daily with breakfast. To be combined with 75 mg daily 90 capsule 0   venlafaxine XR (EFFEXOR-XR) 75 MG 24 hr capsule Take 1 capsule (75 mg total) by mouth daily with  breakfast. To be combined with 150 mg daily 90 capsule 0   No current facility-administered medications for this visit.     Musculoskeletal: Strength & Muscle Tone: within normal limits Gait & Station: normal Patient leans: N/A  Psychiatric Specialty Exam: Review of Systems  Psychiatric/Behavioral:         Grieving  All other systems reviewed and are negative.   Blood pressure 113/76, pulse 89, temperature 98.7 F (37.1 C), temperature source Oral, height 5\' 2"  (1.575 m), weight 224 lb 3.2 oz (101.7 kg).Body mass index is 41.01 kg/m.  General Appearance: Casual  Eye Contact:  Fair  Speech:  Clear and Coherent  Volume:  Normal  Mood:   Grieving  Affect:  Congruent  Thought Process:  Goal Directed and Descriptions of Associations: Intact  Orientation:  Full (Time, Place, and Person)  Thought Content: Logical   Suicidal Thoughts:  No  Homicidal Thoughts:  No  Memory:  Immediate;   Fair Recent;   Fair Remote;   Fair  Judgement:  Fair  Insight:  Fair  Psychomotor Activity:  Normal  Concentration:  Concentration: Fair and Attention Span: Fair  Recall:  of Knowledge: Fair  Language: Fair  Akathisia:  No  Handed:  Right  AIMS (if indicated): done  Assets:  Communication Skills Desire for Improvement Housing Social Support  ADL's:  Intact  Cognition: WNL  Sleep:  Fair   Screenings: AIMS    Flowsheet Row  Office Visit from 03/04/2022 in [facility] Office Visit from 12/15/2021 in [facility] Office Visit from 09/10/2021 in [facility] Office Visit from 06/04/2021 in [facility] Office Visit from 12/31/2020 in [facility]  AIMS Total Score 0 0 0 0 0      GAD-7    Flowsheet Row Office Visit from 03/04/2022 in [facility] Office Visit from 12/15/2021 in [facility] Office Visit from 06/04/2021 in [facility] Office Visit from 12/31/2020 in [facility] Video Visit from 10/01/2020 in [facility]  Total GAD-7 Score 1 4 0 0 0      PHQ2-9    Flowsheet Row Office Visit from 03/04/2022 in [facility] Office Visit from 12/15/2021 in [facility] Office Visit from 09/10/2021 in [facility] Office Visit from 06/04/2021 in [facility] Office Visit from 03/04/2021 in [facility]  PHQ-2 Total Score 2 3 0 1 0  PHQ-9 Total Score 4 4 2  -- --      Flowsheet Row Office Visit from 03/04/2022 in [facility] Admission (Discharged) from 04/24/2021 in [facility] (1A) Pre-Admission Testing 60 from 04/14/2021 in [facility] PRE ADMISSION TESTING  C-SSRS RISK CATEGORY No Risk No Risk No Risk        Assessment and Plan: Bettyjane Shenoy is a 71 year old Caucasian female, widowed, lives in [place], has a history of schizoaffective disorder, left knee total replacement surgery was evaluated in office today.  Patient continues to grieve the loss of her spouse and her grandson as well as her situational stressors of her sister going  through medical problems.  Patient will benefit from psychotherapy sessions, continue medications as noted below.  Plan Schizoaffective disorder-stable Abilify 2 mg p.o. daily Effexor extended release 225 mg p.o. daily  Bereavement-improving Klonopin 0.5 mg 1-2 times a week only for severe  grief reaction.  Patient has not used it although it is available. Patient to start CBT with Ms. Christina Hussami tomorrow.  Looks forward to that.  Follow-up in clinic in 3 months or sooner if needed.   This note was generated in part or whole with voice recognition software. Voice recognition is usually quite accurate but there are transcription errors that can and very often do occur. I apologize for any typographical errors that were not detected and corrected.      Jomarie Longs, MD 03/04/2022, 2:27 PM

## 2022-03-05 ENCOUNTER — Ambulatory Visit (INDEPENDENT_AMBULATORY_CARE_PROVIDER_SITE_OTHER): Payer: Medicare PPO | Admitting: Licensed Clinical Social Worker

## 2022-03-05 DIAGNOSIS — Z634 Disappearance and death of family member: Secondary | ICD-10-CM | POA: Diagnosis not present

## 2022-03-05 DIAGNOSIS — F251 Schizoaffective disorder, depressive type: Secondary | ICD-10-CM

## 2022-03-06 ENCOUNTER — Encounter (HOSPITAL_COMMUNITY): Payer: Self-pay
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
## 2022-03-06 NOTE — Progress Notes (Signed)
THERAPIST PROGRESS NOTE  Session Time: 1-2p  Behavioral Health Outpatient [facility] in office visit for patient and LCSW clinician  Pt provided verbal consent for LCSW counselor trainee, Carolee Rota , to be present throughout session.    Participation Level: Active  Behavioral Response: Neat and Well GroomedAlertAnxious and Depressed  Type of Therapy: Individual Therapy  Treatment Goals addressed: REASSESSMENT AND DEVELOPMENT/REVIEW OF GOALS  ProgressTowards Goals: Progressing  Interventions: CBT, DBT, Strength-based, and Supportive  Summary: Judith [name] is a 71 y.o. female who presents with ongoing symptoms related to schizoaffective disorder and bereavement. Pt reports that her mood fluctuates depending on external stressors  Patient is a 71 year old female reporting to [facility] for reestablishment of outpatient psychotherapy services. Patient reports continuing symptoms related to schizoaffective disorder and grief. Patient reports that she continues to grieve the loss of a previous dog, loss of our grandson, and loss of her husband. Patient reports that this time of year is very triggering for her, so she feels that she needs additional support to help get her through a tough time. Patient reports that she is continuing to reside with her sister, and that she gets along well with her sister. Patient reports that she has a good relationship with her son, and is looking forward to spending Thanksgiving with her son and his family (which includes grandchildren and great grandchildren) in [place].  Patient reports that she is continuing psychiatric medication management with her psychiatrist of record, Dr. Jomarie Longs. Patient is currently taking Abilify and klonopin for management of move related symptoms. Patient denies any recent inpatient psychiatric hospitalizations. Patient denies current suicidal ideation, homicidal ideation, or any perceptual disturbances. Patient denies any  recent self harm, and reports that she has not used any substances in the past few months. Patient reports social drinking and no other substance use. Patient reports that she's having less chronic pain since having knee surgery. Patient reports that she is continuing to manage her diabetes. Patient denies any additional medical issues. The patient reports that she would like to reestablish outpatient therapy to help her develop and maintain coping skills for managing mood symptoms, stress, and anxiety. Patient also needs additional support to get through holiday times, which she finds very triggering.  Allowed pt to identify grief triggers and reviewed coping skills for grief/stress/mood management. Pt reflects understanding.   Continued recommendations are as follows: self care behaviors, positive social engagements, focusing on overall work/home/life balance, and focusing on positive physical and emotional wellness.   .   Suicidal/Homicidal: No  Therapist Response: Pt is continuing to apply interventions learned in session into daily life situations. Pt is currently on track to meet goals utilizing interventions mentioned above. Personal growth and progress noted. Treatment to continue as indicated.   Plan: Return again in 4 weeks.  Diagnosis:  Encounter Diagnoses  Name Primary?   Schizoaffective disorder, depressive type (HCC) Yes   Bereavement    Collaboration of Care: Other pt encouraged to continue care with psychiatrist of record, Dr. Jomarie Longs  Patient/Guardian was advised Release of Information must be obtained prior to any record release in order to collaborate their care with an outside provider. Patient/Guardian was advised if they have not already done so to contact the registration department to sign all necessary forms in order for [place] to release information regarding their care.   Consent: Patient/Guardian gives verbal consent for treatment and assignment of benefits for  services provided during this visit. Patient/Guardian expressed understanding and agreed  to proceed.   Ernest Haber Joneric Streight, LCSW 03/06/2022

## 2022-04-04 IMAGING — DX DG KNEE 1-2V*L*
2 series · 2 of 2 positions shown · non-contrast
Comparison: None.

CLINICAL DATA: Status post left total knee replacement.

EXAM:
LEFT KNEE - 1-2 VIEW

[knee ap]
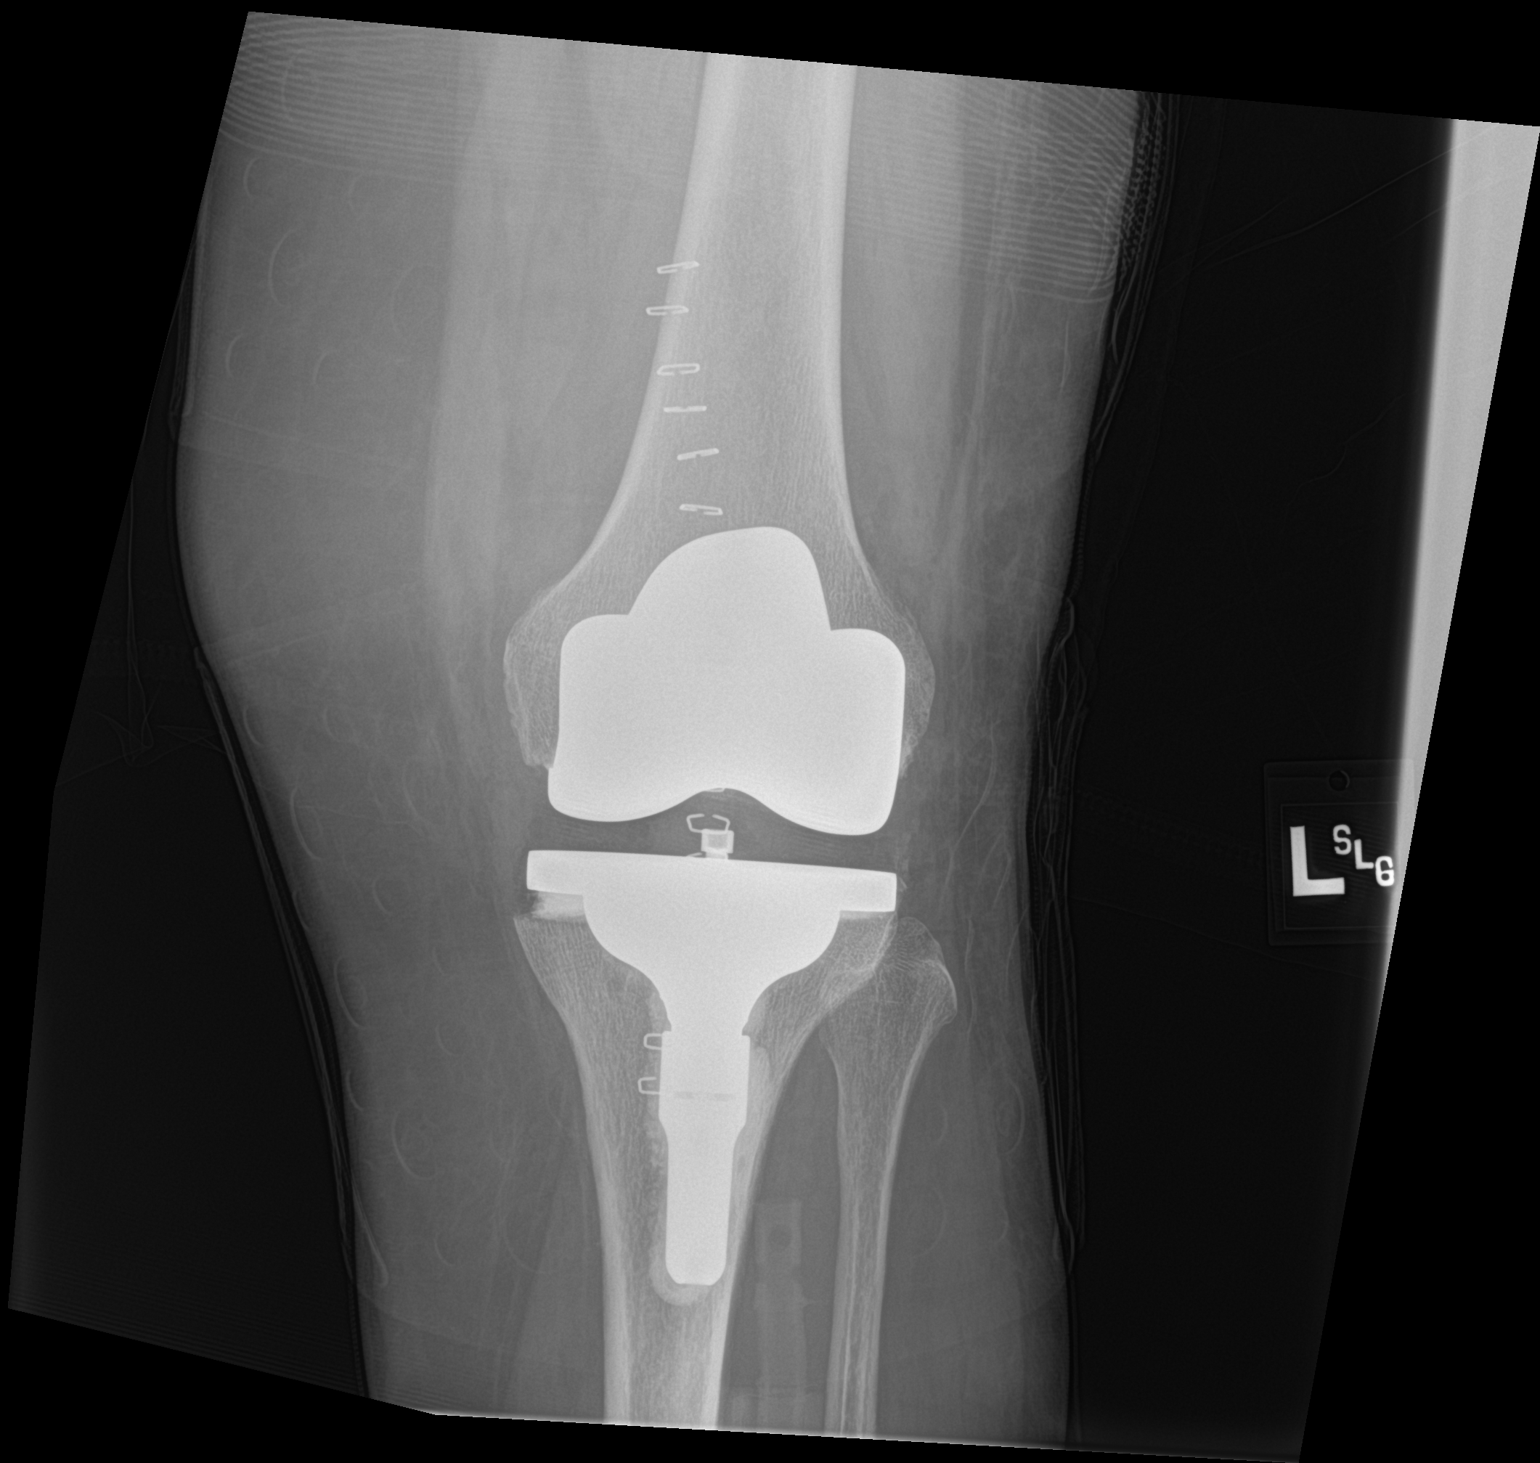

[knee lat]
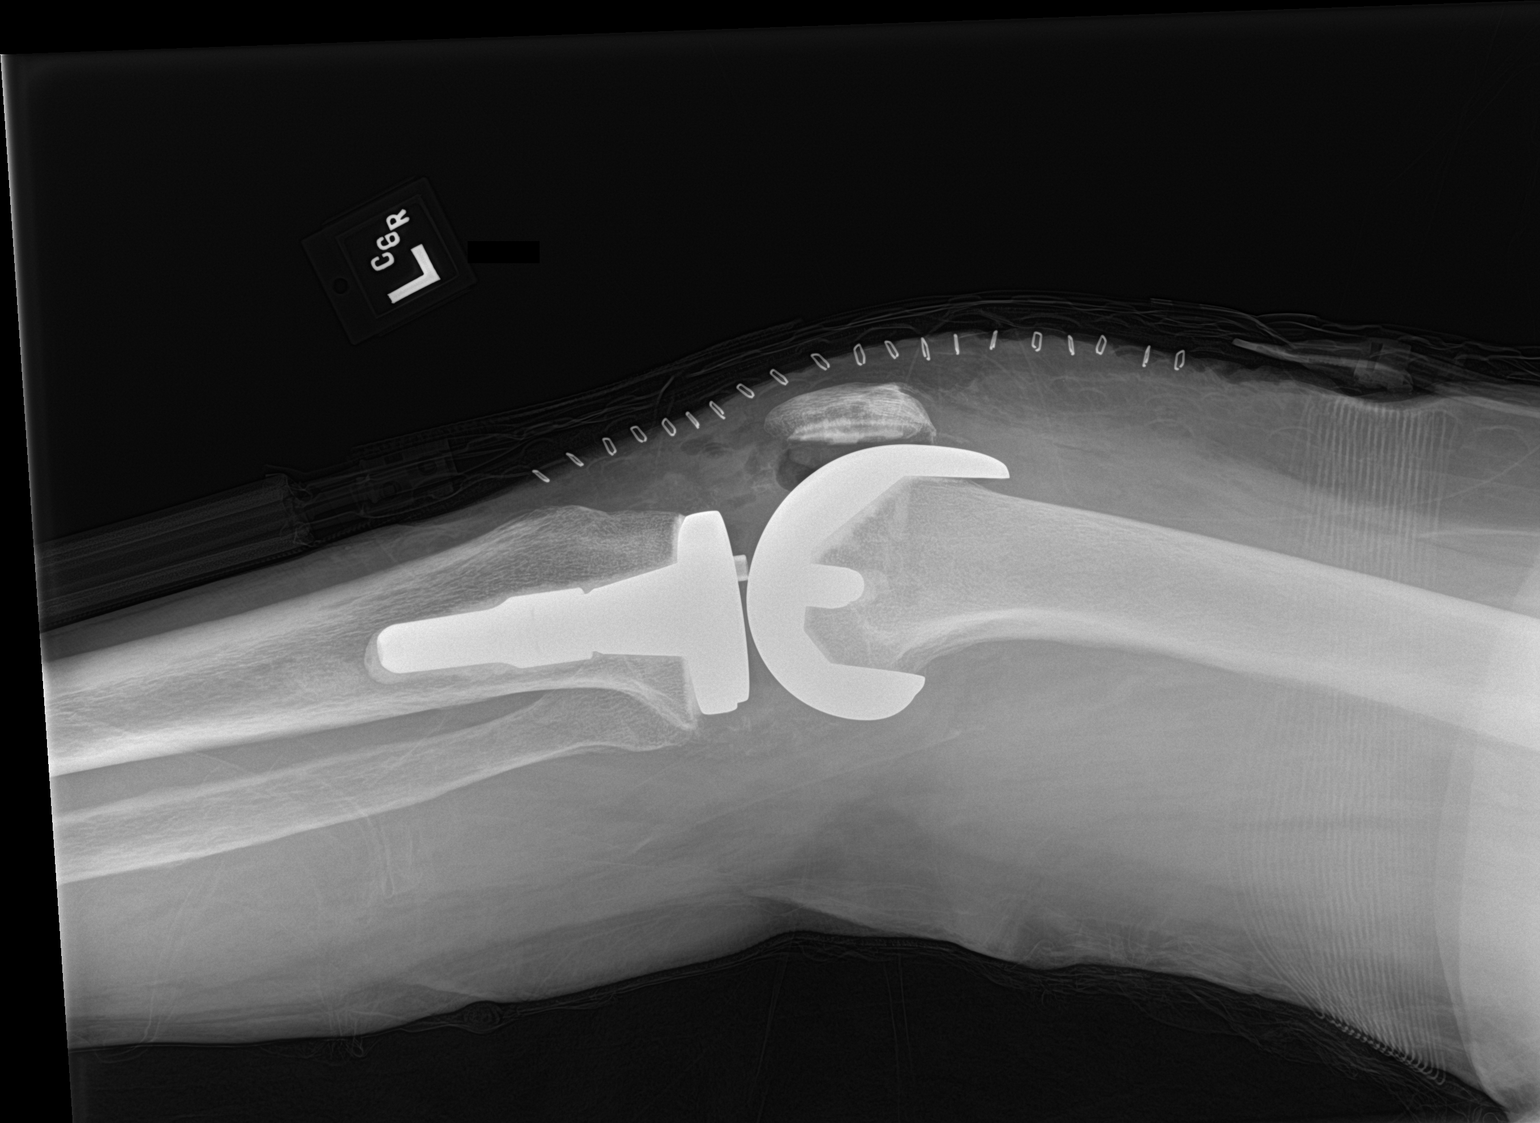

[2 of 2 positions shown; findings below may reference images not displayed]

FINDINGS: The left femoral and tibial components are well situated. Expected
postoperative changes are noted in the soft tissues anteriorly.
IMPRESSION: Status post left total knee arthroplasty.

## 2022-04-29 ENCOUNTER — Ambulatory Visit (HOSPITAL_COMMUNITY): Payer: Medicare PPO | Admitting: Licensed Clinical Social Worker

## 2022-05-17 ENCOUNTER — Other Ambulatory Visit: Payer: Self-pay | Admitting: Psychiatry

## 2022-05-17 DIAGNOSIS — F251 Schizoaffective disorder, depressive type: Secondary | ICD-10-CM

## 2022-06-03 ENCOUNTER — Ambulatory Visit (HOSPITAL_COMMUNITY): Payer: Medicare PPO | Admitting: Licensed Clinical Social Worker

## 2022-06-03 DIAGNOSIS — F251 Schizoaffective disorder, depressive type: Secondary | ICD-10-CM | POA: Diagnosis not present

## 2022-06-03 DIAGNOSIS — F4381 Prolonged grief disorder: Secondary | ICD-10-CM | POA: Diagnosis not present

## 2022-06-04 ENCOUNTER — Ambulatory Visit: Payer: Medicare PPO | Admitting: Psychiatry

## 2022-06-04 ENCOUNTER — Ambulatory Visit (INDEPENDENT_AMBULATORY_CARE_PROVIDER_SITE_OTHER): Payer: Medicare PPO | Admitting: Psychiatry

## 2022-06-04 ENCOUNTER — Encounter: Payer: Self-pay | Admitting: Psychiatry

## 2022-06-04 VITALS — BP 137/82 | HR 93 | Temp 97.5°F | Ht 62.0 in | Wt 221.4 lb

## 2022-06-04 DIAGNOSIS — F4381 Prolonged grief disorder: Secondary | ICD-10-CM | POA: Diagnosis not present

## 2022-06-04 DIAGNOSIS — F251 Schizoaffective disorder, depressive type: Secondary | ICD-10-CM

## 2022-06-04 MED ORDER — ARIPIPRAZOLE 2 MG PO TABS: ORAL_TABLET | ORAL | 0 refills | Status: DC

## 2022-06-04 NOTE — Progress Notes (Signed)
[facility] MD OP Progress Note  06/04/2022 3:29 PM Judith [name]  MRN:  [id number]  Chief Complaint:  Chief Complaint  Patient presents with   Follow-up   Anxiety   grief   Medication Refill   HPI: Judith [name] is a 72 year old Caucasian female, widowed, lives in [place], currently on SSI, history of schizoaffective disorder, obstructive sleep apnea on CPAP, diabetes mellitus, hyperlipidemia, fibromyalgia, history of multiple surgeries was evaluated in office today.  Patient had a fall recently while she was coming out in to a parking lot and went to catch a shopping cart which was rolling away.  Patient reports she stumbled, tripping on her feet, patient had facial injury-nasal fracture.  She reports she has recovered well and currently does not have any significant pain or problems from it.  She did not have any head injury.  Reports overall mood symptoms are good.  Denies any significant depression or anxiety.  Continues to have grief reaction, past history of loss, death of her husband and her grandson.  Patient became tearful when she discussed them.  However she reports she is coping better although she does have her triggers.  Continues to follow-up with her therapist on a regular basis.  Patient denies any suicidality, homicidality or perceptual disturbances.  Reports she is compliant on her medications like Abilify, Effexor.  She does report she feels tired a lot, fatigued.  She is overweight, unable to do much exercise although she is trying.  She just started an intermittent fasting program and is planning to meet her goals by May of this year.  She is motivated to do so.  Denies any other concerns today.  Visit Diagnosis:    ICD-10-CM   1. 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  Past  Medical History:  Past Medical History:  Diagnosis Date   Anginal pain ([place])    Arthritis    Asthma    Depression    Diabetes mellitus without complication (HCC)    Dysrhythmia    Fibromyalgia    Gastroparesis    GERD (gastroesophageal reflux disease)    Headache    Sleep apnea     Past Surgical History:  Procedure Laterality Date   ABDOMINAL HYSTERECTOMY     BLADDER REPAIR     CATARACT EXTRACTION Bilateral 02/08/2013   CHOLECYSTECTOMY     ESOPHAGOGASTRODUODENOSCOPY (EGD) WITH PROPOFOL N/A 04/01/2016   Procedure: ESOPHAGOGASTRODUODENOSCOPY (EGD) WITH PROPOFOL;  Surgeon: Manya Silvas, MD;  Location: [facility];  Service: Endoscopy;  Laterality: N/A;   rotator cuff surgery Right    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  Family History:  Family History  Problem Relation Age of Onset   Bipolar disorder Sister    Bipolar disorder Brother    Breast cancer Neg Hx     Social History: Reviewed social history from progress note on 01/29/2020. Social History   Socioeconomic History   Marital status: Widowed    Spouse name: Not on file   Number of children: 2   Years of education: Not on file   Highest  education level: Not on file  Occupational History   Not on file  Tobacco Use   Smoking status: Never   Smokeless tobacco: Never  Vaping Use   Vaping Use: Never used  Substance and Sexual Activity   Alcohol use: Yes    Comment: averages lessthan 1 drink per week   Drug use: No   Sexual activity: Never  Other Topics Concern   Not on file  Social History Narrative   Retired   Investment banker, operational of Radio broadcast assistant Strain: Not on file  Food Insecurity: Not on file  Transportation Needs: Not on file   Physical Activity: Not on file  Stress: Not on file  Social Connections: Not on file    Allergies:  Allergies  Allergen Reactions   Pravastatin Other (See Comments)    Muscle pain   Topiramate Other (See Comments)   Metformin Nausea Only    Metabolic Disorder Labs: No results found for: "HGBA1C", "MPG" Lab Results  Component Value Date   PROLACTIN 10.4 03/04/2021   No results found for: "CHOL", "TRIG", "HDL", "CHOLHDL", "VLDL", "LDLCALC" No results found for: "TSH"  Therapeutic Level Labs: No results found for: "LITHIUM" No results found for: "VALPROATE" No results found for: "CBMZ"  Current Medications: Current Outpatient Medications  Medication Sig Dispense Refill   albuterol (PROVENTIL HFA;VENTOLIN HFA) 108 (90 Base) MCG/ACT inhaler Inhale 2 puffs into the lungs every 6 (six) hours as needed for wheezing or shortness of breath.     azelastine (ASTELIN) 0.1 % nasal spray Place 1 spray into both nostrils daily as needed for allergies.     cetirizine (ZYRTEC) 10 MG tablet Take 10 mg by mouth daily.     clonazePAM (KLONOPIN) 0.5 MG tablet Take 1 tablet (0.5 mg total) by mouth as directed. Take 1-2 tablets once a week as needed for severe anxiety, grief reaction 8 tablet 1   Dulaglutide 4.5 MG/0.5ML SOPN Inject 4.5 mg into the skin once a week.     ezetimibe (ZETIA) 10 MG tablet Take 1 tablet by mouth daily.     glipiZIDE (GLUCOTROL XL) 10 MG 24 hr tablet Take 20 mg by mouth daily with breakfast.     glucose blood test strip 1 each by Other route. Use as instructed     Insulin Pen Needle (PEN NEEDLES 31GX5/16") 31G X 8 MM MISC See admin instructions.     LANTUS SOLOSTAR 100 UNIT/ML Solostar Pen Inject 28 Units into the skin at bedtime.     metoprolol succinate (TOPROL-XL) 25 MG 24 hr tablet 25 mg daily.     Multiple Vitamin (MULTI-VITAMIN) tablet Take 1 tablet by mouth daily.     omeprazole (PRILOSEC) 40 MG capsule Take 40 mg by mouth daily.     oxybutynin (DITROPAN-XL) 5  MG 24 hr tablet Take 5 mg by mouth daily.     pioglitazone (ACTOS) 15 MG tablet Take 15 mg by mouth daily.     primidone (MYSOLINE) 50 MG tablet Take by mouth 4 (four) times daily.     rizatriptan (MAXALT) 10 MG tablet Take by mouth.     venlafaxine XR (EFFEXOR-XR) 150 MG 24 hr capsule TAKE 1 CAPSULE EVERY DAY WITH BREAKFAST. TO BE COMBINED WITH 75MG DAILY 90 capsule 3   venlafaxine XR (EFFEXOR-XR) 75 MG 24 hr capsule TAKE 1 CAPSULE EVERY DAY WITH BREAKFAST. TO BE COMBINED WITH 150MG DAILY 90 capsule 3   alendronate (FOSAMAX) 70 MG tablet Take by mouth.  ARIPiprazole (ABILIFY) 2 MG tablet TAKE 1 TABLET(2 MG) BY MOUTH DAILY 90 tablet 0   linaclotide (LINZESS) 145 MCG CAPS capsule Take 145 mcg by mouth every other day.     SPIKEVAX 50 MCG/0.5ML SUSY Inject into the muscle. (Patient not taking: Reported on 06/04/2022)     No current facility-administered medications for this visit.     Musculoskeletal: Strength & Muscle Tone: within normal limits Gait & Station: normal Patient leans: N/A  Psychiatric Specialty Exam: Review of Systems  Psychiatric/Behavioral:  Positive for dysphoric mood.   All other systems reviewed and are negative.   Blood pressure 137/82, pulse 93, temperature (!) 97.5 F (36.4 C), temperature source Skin, height 5' 2"$  (1.575 m), weight 221 lb 6.4 oz (100.4 kg).Body mass index is 40.49 kg/m.  General Appearance: Casual  Eye Contact:  Fair  Speech:  Normal Rate  Volume:  Normal  Mood:  Dysphoric  Affect:  Congruent  Thought Process:  Goal Directed and Descriptions of Associations: Intact  Orientation:  Full (Time, Place, and Person)  Thought Content: Logical   Suicidal Thoughts:  No  Homicidal Thoughts:  No  Memory:  Immediate;   Fair Recent;   Fair Remote;   Fair  Judgement:  Fair  Insight:  Fair  Psychomotor Activity:  Normal  Concentration:  Concentration: Fair and Attention Span: Fair  Recall:  [employer] of Knowledge: Fair  Language: Fair   Akathisia:  No  Handed:  Right  AIMS (if indicated): done  Assets:  Communication Skills Desire for Improvement Housing Social Support  ADL's:  Intact  Cognition: WNL  Sleep:  Fair   Screenings: [place] Visit from 06/04/2022 in [facility] Office Visit from 03/04/2022 in [place] Office Visit from 12/15/2021 in [place] Office Visit from 09/10/2021 in [facility] Office Visit from 06/04/2021 in [place] Total Score 0 0 0 0 0      [place] Office Visit from 06/04/2022 in [facility] Counselor from 03/05/2022 in [facility] at [place] from 03/04/2022 in [place] Office Visit from 12/15/2021 in [place] Office Visit from 06/04/2021 in [facility]  Total GAD-7 Score 0 3 1 4 $ 0      PHQ2-9    [place] Office Visit from 06/04/2022 in [place] Counselor from 06/03/2022 in [place] at [facility] from 03/05/2022 in [place] at [place] from 03/04/2022 in [place] Office Visit from 12/15/2021 in [facility]  PHQ-2 Total Score 2 2 3 2 3  $ PHQ-9 Total Score 2 6 9 4 4      $ [place] [facility] Office Visit from 06/04/2022 in [place] Counselor from 06/03/2022 in [place] at [facility] from 03/05/2022 in [place] at [place] No Risk No Risk No Risk        Assessment and Plan: Judith [name] is a 72 year old Caucasian female, widowed, lives in [place], has a history of schizoaffective disorder, left knee total replacement surgery was evaluated in office today.  Patient is currently improving with regards to her grief although she continues to struggle with fatigue likely due to lack of exercise, being overweight as well as being on polypharmacy.  Discussed plan as noted below.  Plan Schizoaffective disorder-stable Abilify 2 mg p.o. daily Effexor extended release 225 mg p.o. daily  Bereavement-improving Patient to continue CBT with Ms. Christina Hussami  Discussed diet plan, exercise, lifestyle modification.  Will reevaluate patient in 3 to 4 months from now.  This note was generated in part or whole with voice recognition software. Voice recognition is usually quite accurate but there are transcription errors that can and very often do occur. I apologize for any typographical errors that were not detected and corrected.     Ursula Alert, MD 06/05/2022, 8:12 AM

## 2022-06-04 NOTE — Progress Notes (Signed)
THERAPIST PROGRESS NOTE  Session Time: [place] in office visit for patient and LCSW clinician  Pt provided verbal consent for Katie Bounds, LCSW (observing clinician) to be present throughout session.   Participation Level: Active  Behavioral Response: Neat and Well GroomedAlertAnxious and tearful  Type of Therapy: Individual Therapy  Treatment Goals addressed:  Develop healthy thinking patterns and beliefs about self, others, and the world that lead to the alleviation and help prevent the relapse of depression and/or other symptoms   Sustain symptom remission and continue the gains made during the acute phase of treatment: per pt self report   ProgressTowards Goals: Progressing  Interventions: CBT, Strength-based, Supportive, and Other: grief support  Summary: Judith [name] is a 72 y.o. female who presents with improving symptoms related to schizoaffective disorder and bereavement.   Allowed pt to explore and express thoughts and feelings associated with recent life situations and external stressors.  Patient reports that she recently experienced a bad fall, and a knee injury.  Patient reports that the pain impacted her overall sleep quality and quantity.  Patient reports that her overall mood has been stable, and that she feels she is managing anxiety and stress symptoms well.  Patient reports her appetite has been within normal limits.  Patient denies any suicidal ideation or perceptual disturbances.  Allowed patient to explore grief associated with the loss of her husband and the loss of her son.  Patient reports that she is still experiencing this psychological impact on a regular basis.  Patient reports that she has a dog that is very close with her--patient feels that her dog is her emotional support animal.  Patient reports that she is trying to be physically active when she is not in pain, and is trying to socially engage with others  when she can.  Discussed importance of self-care activities and patient reports that she wants to try to admit more if her hand pain will allow her.  Continued recommendations are as follows: self care behaviors, positive social engagements, focusing on overall work/home/life balance, and focusing on positive physical and emotional wellness.   Suicidal/Homicidal: No  Therapist Response: Pt is continuing to apply interventions learned in session into daily life situations. Pt is currently on track to meet goals utilizing interventions mentioned above. Personal growth and progress noted. Treatment to continue as indicated.   Plan: Return again in 4 weeks.  Diagnosis:  Encounter Diagnoses  Name Primary?   Schizoaffective disorder, depressive type ([place]) Yes   Prolonged grief disorder     Collaboration of Care: Other pt to continue care with psychiatrist of record, Dr. Ursula Alert  Patient/Guardian was advised Release of Information must be obtained prior to any record release in order to collaborate their care with an outside provider. Patient/Guardian was advised if they have not already done so to contact the registration department to sign all necessary forms in order for [place] to release information regarding their care.   Consent: Patient/Guardian gives verbal consent for treatment and assignment of benefits for services provided during this visit. Patient/Guardian expressed understanding and agreed to proceed.   [place], LCSW 06/04/2022

## 2022-07-02 ENCOUNTER — Ambulatory Visit (INDEPENDENT_AMBULATORY_CARE_PROVIDER_SITE_OTHER): Payer: Medicare PPO | Admitting: Licensed Clinical Social Worker

## 2022-07-02 DIAGNOSIS — F251 Schizoaffective disorder, depressive type: Secondary | ICD-10-CM

## 2022-07-02 DIAGNOSIS — F4381 Prolonged grief disorder: Secondary | ICD-10-CM | POA: Diagnosis not present

## 2022-07-02 NOTE — Progress Notes (Signed)
THERAPIST PROGRESS NOTE  Session Time: [place] in office visit for patient and LCSW clinician  Participation Level: Active  Behavioral Response: Neat and Well GroomedAlertAnxious and tearful  Type of Therapy: Individual Therapy  Treatment Goals addressed:  Develop healthy thinking patterns and beliefs about self, others, and the world that lead to the alleviation and help prevent the relapse of depression and/or other symptoms   Sustain symptom remission and continue the gains made during the acute phase of treatment: per pt self report   ProgressTowards Goals: Progressing  Interventions: CBT, Strength-based, Supportive, and Other: grief support  Summary: Judith [name] is a 72 y.o. female who presents with improving symptoms related to schizoaffective disorder and bereavement.   Allowed pt to explore and express thoughts and feelings associated with recent life situations and external stressors.  Patient reports that she is continuing to experience stress and that associated with ongoing grief and missing her late husband.  Patient reports that the anniversary of his death was in 06-25-2022, and the anniversary of losing a very dear pet was also in June 25, 2022.  Assisted patient in identifying that 06/25/2022 is a triggering month for her due to these losses.  Allow patient to explore situations and experiences that have helped her when she is feeling most down.  Patient reports that her sister is a very good support system for her, and her current dog is a very good support.  Explored patient's personal experience with intrusive thoughts, and hearing voices.  Patient reports that this used to be more scary for her, but now patient has the overall self-awareness to knows that these things are not really happening, they are just inside of her head.  Reviewed strategies to help manage intrusive thoughts in the moment.  Patient reports that she likes to make  up stories, and tell stories to herself and her head.  Patient feels that this is very helpful for her.  Patient states that she is using intermittent fasting to lose weight.  Patient reports that she has lost 5 pounds, but is feeling defeated because she feels that it is a lot of work and she has not seen a lot of results.  Encouraged patient to discuss intermittent fasting with her primary care physician or endocrinologist since she currently has diabetes.  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  Continued recommendations are as follows: self care behaviors, positive social engagements, focusing on overall work/home/life balance, and focusing on positive physical and emotional wellness.   Suicidal/Homicidal: No  Therapist Response: Pt is continuing to apply interventions learned in session into daily life situations. Pt is currently on track to meet goals utilizing interventions mentioned above. Personal growth and progress noted. Treatment to continue as indicated.   Patient is continuing to use make good progress but overall self-management, self-care, and ability to identify and process through feelings of grief.  Plan: Return again in 4 weeks.  Diagnosis:  Encounter Diagnoses  Name Primary?   Schizoaffective disorder, depressive type ([place]) Yes   Prolonged grief disorder      Collaboration of Care: Other pt to continue care with psychiatrist of record, Dr. Ursula Alert  Patient/Guardian was advised Release of Information must be obtained prior to  any record release in order to collaborate their care with an outside provider. Patient/Guardian was advised if they have not already done so to contact the registration department to sign all  necessary forms in order for [place] to release information regarding their care.   Consent: Patient/Guardian gives verbal consent for treatment and assignment of benefits for services provided during this visit. Patient/Guardian expressed understanding and agreed to proceed.   [name], LCSW 07/02/2022

## 2022-07-30 ENCOUNTER — Ambulatory Visit (INDEPENDENT_AMBULATORY_CARE_PROVIDER_SITE_OTHER): Payer: Medicare PPO | Admitting: Licensed Clinical Social Worker

## 2022-07-30 DIAGNOSIS — F251 Schizoaffective disorder, depressive type: Secondary | ICD-10-CM | POA: Diagnosis not present

## 2022-07-30 NOTE — Progress Notes (Addendum)
THERAPIST PROGRESS NOTE  Session Time: [place] in office visit for patient and LCSW clinician  Participation Level: Active  Behavioral Response: Neat and Well GroomedAlertAnxious and tearful  Type of Therapy: Individual Therapy  Treatment Goals addressed:  Develop healthy thinking patterns and beliefs about self, others, and the world that lead to the alleviation and help prevent the relapse of depression and/or other symptoms   Sustain symptom remission and continue the gains made during the acute phase of treatment: per pt self report   ProgressTowards Goals: Progressing  Interventions: CBT, Strength-based, Supportive, and Other: grief support  Summary: Addielynn Kleinberg is a 72 y.o. female who presents with improving symptoms related to schizoaffective disorder and bereavement.   Allowed pt to explore and express thoughts and feelings associated with recent life situations and external stressors.    Continued recommendations are as follows: self care behaviors, positive social engagements, focusing on overall work/home/life balance, and focusing on positive physical and emotional wellness.   Suicidal/Homicidal: No  Therapist Response: Pt is continuing to apply interventions learned in session into daily life situations. Pt is currently on track to meet goals utilizing interventions mentioned above. Personal growth and progress noted. Treatment to continue as indicated.   Patient is continuing to use make good progress but overall self-management, self-care, and ability to identify and process through feelings of grief.  Plan: Return again in 4 weeks.  Diagnosis:  Encounter Diagnosis  Name Primary?   Schizoaffective disorder, depressive type Yes     Collaboration of Care: Other pt to continue care with psychiatrist of record, Dr. Ursula Alert  Patient/Guardian was advised Release of Information must be obtained prior to any record  release in order to collaborate their care with an outside provider. Patient/Guardian was advised if they have not already done so to contact the registration department to sign all necessary forms in order for [place] to release information regarding their care.   Consent: Patient/Guardian gives verbal consent for treatment and assignment of benefits for services provided during this visit. Patient/Guardian expressed understanding and agreed to proceed.   [place], LCSW 07/30/2022

## 2022-07-30 NOTE — Addendum Note (Signed)
Addended by: Granville Lewis on: 07/30/2022 03:03 PM   Modules accepted: Level of Service

## 2022-09-09 ENCOUNTER — Ambulatory Visit (HOSPITAL_COMMUNITY): Payer: Medicare PPO | Admitting: Licensed Clinical Social Worker

## 2022-09-09 DIAGNOSIS — F251 Schizoaffective disorder, depressive type: Secondary | ICD-10-CM

## 2022-09-09 NOTE — Progress Notes (Signed)
THERAPIST PROGRESS NOTE  Session Time: 110-2p  Behavioral Health Outpatient [facility] in office visit for patient and LCSW clinician  Participation Level: Active  Behavioral Response: Neat and Well GroomedAlertAnxious and tearful  Type of Therapy: Individual Therapy  Treatment Goals addressed:  Develop healthy thinking patterns and beliefs about self, others, and the world that lead to the alleviation and help prevent the relapse of depression and/or other symptoms   Sustain symptom remission and continue the gains made during the acute phase of treatment: per pt self report   ProgressTowards Goals: Progressing  Interventions: CBT, Strength-based, Supportive, and Other: grief support  Summary: Judith [name] is a 72 y.o. female who presents with improving symptoms related to schizoaffective disorder and bereavement.   Clinician assisted pt with identifying situations/scenarios/schemas triggering anxiety and/or depression symptoms. Allowed pt to explore and express thoughts and feelings and discussed current coping mechanisms. Reviewed changes/recommendations. Discussed upcoming hand surgery--pt excited to finally be able to do the surgery instead of injections in hand. Pt reports that walking is a good intervention to help boost mood and manage stress. Pt enjoys spending time with dogs and crochet/knitting.   Discussed falls in the past and reviewed importance of home safety. Discussed forgetfulness--pt is under care of neurologist.   Continued recommendations are as follows: self care behaviors, positive social engagements, focusing on overall work/home/life balance, and focusing on positive physical and emotional wellness.   Suicidal/Homicidal: No  Therapist Response: Pt is continuing to apply interventions learned in session into daily life situations. Pt is currently on track to meet goals utilizing interventions mentioned above. Personal growth and progress noted. Treatment to  continue as indicated.   Patient is continuing to use make good progress but overall self-management, self-care, and ability to identify and process through feelings of grief.  Plan: Return again in 4 weeks.  Diagnosis:  Encounter Diagnosis  Name Primary?   Schizoaffective disorder, depressive type (HCC) Yes    Collaboration of Care: Other pt to continue care with psychiatrist of record, Dr. Jomarie Longs  Patient/Guardian was advised Release of Information must be obtained prior to any record release in order to collaborate their care with an outside provider. Patient/Guardian was advised if they have not already done so to contact the registration department to sign all necessary forms in order for [place] to release information regarding their care.   Consent: Patient/Guardian gives verbal consent for treatment and assignment of benefits for services provided during this visit. Patient/Guardian expressed understanding and agreed to proceed.   Ernest Haber Fonda Rochon, LCSW 09/09/2022

## 2022-09-15 ENCOUNTER — Encounter: Payer: Self-pay | Admitting: Psychiatry

## 2022-09-15 ENCOUNTER — Ambulatory Visit (INDEPENDENT_AMBULATORY_CARE_PROVIDER_SITE_OTHER): Payer: Medicare PPO | Admitting: Psychiatry

## 2022-09-15 VITALS — BP 121/81 | HR 85 | Temp 97.7°F | Ht 62.0 in | Wt 214.6 lb

## 2022-09-15 DIAGNOSIS — F4381 Prolonged grief disorder: Secondary | ICD-10-CM | POA: Diagnosis not present

## 2022-09-15 DIAGNOSIS — F251 Schizoaffective disorder, depressive type: Secondary | ICD-10-CM | POA: Diagnosis not present

## 2022-09-15 NOTE — Patient Instructions (Addendum)
Neoma Laming, MS, DipACLM, CPT Health Educator at [facility] Certified Lifestyle Medicine Phone [phone]

## 2022-09-15 NOTE — Progress Notes (Signed)
BH MD OP Progress Note  09/15/2022 6:19 PM Judith [name]  MRN:  [id number]  Chief Complaint:  Chief Complaint  Patient presents with   Follow-up   Depression   Medication Refill   HPI: Judith [name] is a 72 year old Caucasian female, widowed, lives in [place], currently on SSI, has a history of schizoaffective disorder, obstructive sleep apnea on CPAP, diabetes mellitus, hyperlipidemia, fibromyalgia, history of multiple surgeries was evaluated in office today.  Patient today reports she is currently doing better with regards to her mood symptoms.  Denies any significant depression, mood swings.  Denies any psychosis.  Patient reports she is currently compliant on the Abilify and venlafaxine.  Denies side effects.  She reports sleep as good.  Reports appetite is fair.  She continues to watch her diet.  She reports she may have lost around 7 to 8 pounds in the past several weeks.  She does walk her dog and is trying to eat right.  She would like to lose more.  Patient reports she is scheduled to have surgery of her hand-left-sided for trigger finger and ganglion cyst tomorrow.  She does have mild pain and she takes Tylenol which helps.  Patient denies any suicidality, homicidality.  Patient denies any other concerns today.  Visit Diagnosis:    ICD-10-CM   1. Schizoaffective disorder, depressive type (HCC)  F25.1     2. 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  Past Medical History:  Past Medical History:  Diagnosis Date   Anginal pain (HCC)    Arthritis    Asthma    Depression    Diabetes mellitus without complication (HCC)    Dysrhythmia    Fibromyalgia    Gastroparesis    GERD (gastroesophageal reflux disease)    Headache    Sleep apnea     Past Surgical History:  Procedure Laterality Date   ABDOMINAL HYSTERECTOMY     BLADDER REPAIR     CATARACT EXTRACTION  Bilateral 02/08/2013   CHOLECYSTECTOMY     ESOPHAGOGASTRODUODENOSCOPY (EGD) WITH PROPOFOL N/A 04/01/2016   Procedure: ESOPHAGOGASTRODUODENOSCOPY (EGD) WITH PROPOFOL;  Surgeon: Scot Jun, MD;  Location: [facility];  Service: Endoscopy;  Laterality: N/A;   rotator cuff surgery Right    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  Family History:  Family History  Problem Relation Age of Onset   Bipolar disorder Sister    Bipolar disorder Brother    Breast cancer Neg Hx     Social History: I have reviewed social history from progress note on 01/29/2020. Social History   Socioeconomic History   Marital status: Widowed    Spouse name: Not on file   Number of children: 2   Years of education: Not on file   Highest education level: Not on file  Occupational History   Not on file  Tobacco Use   Smoking status: Never   Smokeless tobacco: Never  Vaping Use   Vaping Use: Never used  Substance and Sexual Activity   Alcohol use: Yes    Comment: averages lessthan 1 drink per week   Drug use: No   Sexual activity: Never  Other  Topics Concern   Not on file  Social History Narrative   Retired   International aid/development worker of [employer] Strain: Not on file  Food Insecurity: Not on file  Transportation Needs: Not on file  Physical Activity: Not on file  Stress: Not on file  Social Connections: Not on file    Allergies:  Allergies  Allergen Reactions   Nortriptyline Nausea And Vomiting   Pravastatin Other (See Comments)    Muscle pain   Topiramate Other (See Comments)   Metformin Nausea Only    Metabolic Disorder Labs: No results found for: "HGBA1C", "MPG" Lab Results  Component Value Date   PROLACTIN  10.4 03/04/2021   No results found for: "CHOL", "TRIG", "HDL", "CHOLHDL", "VLDL", "LDLCALC" No results found for: "TSH"  Therapeutic Level Labs: No results found for: "LITHIUM" No results found for: "VALPROATE" No results found for: "CBMZ"  Current Medications: Current Outpatient Medications  Medication Sig Dispense Refill   albuterol (PROVENTIL HFA;VENTOLIN HFA) 108 (90 Base) MCG/ACT inhaler Inhale 2 puffs into the lungs every 6 (six) hours as needed for wheezing or shortness of breath.     ARIPiprazole (ABILIFY) 2 MG tablet TAKE 1 TABLET(2 MG) BY MOUTH DAILY 90 tablet 0   azelastine (ASTELIN) 0.1 % nasal spray Place 1 spray into both nostrils daily as needed for allergies.     cetirizine (ZYRTEC) 10 MG tablet Take 10 mg by mouth daily.     clonazePAM (KLONOPIN) 0.5 MG tablet Take 1 tablet (0.5 mg total) by mouth as directed. Take 1-2 tablets once a week as needed for severe anxiety, grief reaction 8 tablet 1   Continuous Glucose Receiver (DEXCOM G7 RECEIVER) DEVI Use 1 each once for 1 dose     Dulaglutide 4.5 MG/0.5ML SOPN Inject 4.5 mg into the skin once a week.     glipiZIDE (GLUCOTROL XL) 10 MG 24 hr tablet Take 20 mg by mouth daily with breakfast.     glucose blood test strip 1 each by Other route. Use as instructed     LANTUS SOLOSTAR 100 UNIT/ML Solostar Pen Inject 28 Units into the skin at bedtime.     metoprolol succinate (TOPROL-XL) 25 MG 24 hr tablet 25 mg daily.     Multiple Vitamin (MULTI-VITAMIN) tablet Take 1 tablet by mouth daily.     omeprazole (PRILOSEC) 40 MG capsule Take 40 mg by mouth daily.     oxybutynin (DITROPAN-XL) 5 MG 24 hr tablet Take 5 mg by mouth daily.     pioglitazone (ACTOS) 15 MG tablet Take 15 mg by mouth daily.     primidone (MYSOLINE) 50 MG tablet Take by mouth 4 (four) times daily.     venlafaxine XR (EFFEXOR-XR) 150 MG 24 hr capsule TAKE 1 CAPSULE EVERY DAY WITH BREAKFAST. TO BE COMBINED WITH 75MG  DAILY 90 capsule 3   venlafaxine XR (EFFEXOR-XR)  75 MG 24 hr capsule TAKE 1 CAPSULE EVERY DAY WITH BREAKFAST. TO BE COMBINED WITH 150MG  DAILY 90 capsule 3   ezetimibe (ZETIA) 10 MG tablet Take 1 tablet by mouth daily.     linaclotide (LINZESS) 145 MCG CAPS capsule Take 145 mcg by mouth every other day.     rizatriptan (MAXALT) 10 MG tablet Take by mouth.     SPIKEVAX 50 MCG/0.5ML SUSY Inject into the muscle. (Patient not taking: Reported on 06/04/2022)     No current facility-administered medications for this visit.     Musculoskeletal: Strength & Muscle Tone: within normal limits  Gait & Station: normal Patient leans: N/A  Psychiatric Specialty Exam: Review of Systems  Psychiatric/Behavioral: Negative.      Blood pressure 121/81, pulse 85, temperature 97.7 F (36.5 C), temperature source Skin, height 5\' 2"  (1.575 m), weight 214 lb 9.6 oz (97.3 kg).Body mass index is 39.25 kg/m.  General Appearance: Casual  Eye Contact:  Fair  Speech:  Clear and Coherent  Volume:  Normal  Mood:  Euthymic  Affect:  Full Range  Thought Process:  Goal Directed and Descriptions of Associations: Intact  Orientation:  Full (Time, Place, and Person)  Thought Content: Logical   Suicidal Thoughts:  No  Homicidal Thoughts:  No  Memory:  Immediate;   Fair Recent;   Fair Remote;   Fair  Judgement:  Fair  Insight:  Fair  Psychomotor Activity:  Normal  Concentration:  Concentration: Fair and Attention Span: Fair  Recall:  [employer] of Knowledge: Fair  Language: Fair  Akathisia:  No  Handed:  Right  AIMS (if indicated): done  Assets:  Communication Skills Desire for Improvement Housing Social Support  ADL's:  Intact  Cognition: WNL  Sleep:  Fair   Screenings: Midwife Visit from 06/04/2022 in [facility] Office Visit from 03/04/2022 in [facility] Office Visit from 12/15/2021 in [facility] Office Visit  from 09/10/2021 in [facility] Office Visit from 06/04/2021 in [facility]  AIMS Total Score 0 0 0 0 0      GAD-7    Flowsheet Row Office Visit from 09/15/2022 in [facility] Office Visit from 06/04/2022 in [facility] Counselor from 03/05/2022 in [facility] at [facility] Visit from 03/04/2022 in [facility] Office Visit from 12/15/2021 in [facility]  Total GAD-7 Score 2 0 3 1 4       PHQ2-9    Flowsheet Row Office Visit from 09/15/2022 in [facility] Office Visit from 06/04/2022 in [facility] Counselor from 06/03/2022 in [facility] from 03/05/2022 in [facility] Health at [facility] Visit from 03/04/2022 in [facility]  PHQ-2 Total Score 0 2 2 3 2   PHQ-9 Total Score 2 2 6 9 4       Flowsheet Row Office Visit from 09/15/2022 in [facility] Counselor from 07/02/2022 in [facility] at [facility] Visit from 06/04/2022 in [facility]  C-SSRS RISK CATEGORY No Risk No Risk No Risk        Assessment and Plan: Judith [name] is a 72 year old Caucasian female, widowed, lives in [place], has a history of schizoaffective disorder, left knee total replacement surgery was evaluated in office today.  Patient is currently stable.  Plan as noted below.  Plan Schizoaffective disorder-stable Abilify 2 mg p.o. daily Effexor extended release 225 mg p.o. daily  Bereavement-stable Continue CBT with Ms. Christina  Hussami  Patient currently trying to lose weight, weight gain likely multifactorial given multiple medical problems, being on medications like Abilify.  I have provided information for lifestyle medicine specialist-Ms. Neoma Laming.  Patient to contact.  Follow-up in clinic in 5 months or sooner if needed.  Collaboration of Care: Collaboration of Care: Other patient to continue psychotherapy sessions.  Patient/Guardian was advised Release of Information must be obtained prior to any record release in order to collaborate their care with an outside provider. Patient/Guardian was advised if they have not already done so to contact the registration department to sign all necessary forms in order for [place] to release information regarding their care.   Consent: Patient/Guardian gives verbal consent for treatment and assignment of benefits for services provided during this visit. Patient/Guardian expressed understanding and agreed to proceed.   This note was generated in part or whole with voice recognition software. Voice recognition is usually quite accurate but there are transcription errors that can and very often do occur. I apologize for any typographical errors that were not detected and corrected.    Jomarie Longs, MD 09/15/2022, 6:19 PM

## 2022-11-12 ENCOUNTER — Ambulatory Visit (HOSPITAL_COMMUNITY): Payer: Medicare PPO | Admitting: Licensed Clinical Social Worker

## 2022-11-12 DIAGNOSIS — F4381 Prolonged grief disorder: Secondary | ICD-10-CM | POA: Diagnosis not present

## 2022-11-12 DIAGNOSIS — F251 Schizoaffective disorder, depressive type: Secondary | ICD-10-CM

## 2022-11-12 NOTE — Progress Notes (Signed)
THERAPIST PROGRESS NOTE  Session Time: 11a-12p Behavioral Health Outpatient [facility] in office visit for patient and LCSW clinician  Participation Level: Active  Behavioral Response: Neat and Well GroomedAlertAnxious and tearful  Type of Therapy: Individual Therapy  Treatment Goals addressed:  Develop healthy thinking patterns and beliefs about self, others, and the world that lead to the alleviation and help prevent the relapse of depression and/or other symptoms   Sustain symptom remission and continue the gains made during the acute phase of treatment: per pt self report   ProgressTowards Goals: Progressing  Interventions: CBT, Strength-based, Supportive, and Other: grief support  Summary: Gracin Soohoo is a 72 y.o. female who presents with improving symptoms related to schizoaffective disorder and bereavement.   Clinician assisted pt with identifying situations/scenarios/schemas triggering anxiety and/or depression symptoms. Allowed pt to explore and express thoughts and feelings and discussed current coping mechanisms.    Allowed pt to explore thoughts and feelings associated with loss. Discussed pts awareness of how loss has impacted overall life. Clinician provided empathy and support while giving patient safe space to explore grief.   Continued recommendations are as follows: self care behaviors, positive social engagements, focusing on overall work/home/life balance, and focusing on positive physical and emotional wellness.   Suicidal/Homicidal: No  Therapist Response: Pt is continuing to apply interventions learned in session into daily life situations. Pt is currently on track to meet goals utilizing interventions mentioned above. Personal growth and progress noted. Treatment to continue as indicated.   Plan: Informed patient that clinician will be leaving outpatient department. Allowed pt to explore any questions or concerns and discussed future counseling  options/resources. Provided pt with psychoeducational resources and list of OPT therapists. Encouraged pt to continue with psychiatric med management appointments, if applicable.   Pt is continuing to be intentional about engaging in recreational activities and spending time with loved ones.  Diagnosis:  Encounter Diagnoses  Name Primary?   Schizoaffective disorder, depressive type (HCC) Yes   Prolonged grief disorder    Collaboration of Care: Other pt to continue care with psychiatrist of record, Dr. Jomarie Longs  Patient/Guardian was advised Release of Information must be obtained prior to any record release in order to collaborate their care with an outside provider. Patient/Guardian was advised if they have not already done so to contact the registration department to sign all necessary forms in order for [place] to release information regarding their care.   Consent: Patient/Guardian gives verbal consent for treatment and assignment of benefits for services provided during this visit. Patient/Guardian expressed understanding and agreed to proceed.   Ernest Haber Shaunice Levitan, LCSW 11/12/2022

## 2022-11-13 NOTE — Patient Instructions (Signed)
 Outpatient Psychiatry and Counseling  FOR CRISIS:  call 911, Therapeutic Alternatives: Mobile Crisis Management 24 hours:  [phone], call [phone], [facility] ([facility]) [address] walk in, or go to your local EMERGENCY DEPARTMENT  [facility] [address], [place], [place] [address]  [phone]  [facility] [address] [place], [place] [address] [phone]  [facility] [address] [place],  [place]  [address] [phone]  [facility] Address: [address]ine Cane [place], [place] [address] Phone: [phone]  [facility] [place] and [place] Locations) [email or web address]  [employer] [facility] [employer] fee and walk in schedule: M-F 8am-12pm/1pm-3pm [address]  [place], [place] [address] [phone]  [facility] [address] [place], [place] [address] [phone]  [facility] Outpatient Services/ Intensive Outpatient Therapy Program/CDIOP/PHP [address] [place], [place] [address] [phone]  [facility] Urgent [facility], Outpatient Therapy Services, [place]      [phone]     [address]    [place], [place] [address]                 [facility]   [facility] [phone]. [address] [place], [place] [address]  [facility]          [address]  [place], [place] [address]       [phone]  [facility] [address] [place], [place] [address] [phone]  [facility]    [address]    [place], [place] [address]     [phone]       [facility] [address] [place] [address]  [facility]     [address] [place], [place]      [phone]       [facility] Eulogio Ditch, MD [address] [place], [place] [address] [phone]  [facility]     [address]     [place], [place] [address]     [phone]       [facility] [address] [place], [place] [address] [phone] Resources for Temporary Residential Assistance/Crisis Centers

## 2023-01-20 DIAGNOSIS — R251 Tremor, unspecified: Secondary | ICD-10-CM | POA: Diagnosis not present

## 2023-01-20 DIAGNOSIS — R413 Other amnesia: Secondary | ICD-10-CM | POA: Diagnosis not present

## 2023-01-20 DIAGNOSIS — R519 Headache, unspecified: Secondary | ICD-10-CM | POA: Diagnosis not present

## 2023-01-20 DIAGNOSIS — R42 Dizziness and giddiness: Secondary | ICD-10-CM | POA: Diagnosis not present

## 2023-01-20 DIAGNOSIS — R2681 Unsteadiness on feet: Secondary | ICD-10-CM | POA: Diagnosis not present

## 2023-02-02 DIAGNOSIS — M47812 Spondylosis without myelopathy or radiculopathy, cervical region: Secondary | ICD-10-CM | POA: Diagnosis not present

## 2023-02-11 ENCOUNTER — Encounter: Payer: Self-pay | Admitting: Internal Medicine

## 2023-02-13 DIAGNOSIS — G4733 Obstructive sleep apnea (adult) (pediatric): Secondary | ICD-10-CM | POA: Diagnosis not present

## 2023-02-15 ENCOUNTER — Encounter: Payer: Self-pay | Admitting: Psychiatry

## 2023-02-15 ENCOUNTER — Ambulatory Visit (INDEPENDENT_AMBULATORY_CARE_PROVIDER_SITE_OTHER): Payer: Medicare PPO | Admitting: Psychiatry

## 2023-02-15 VITALS — BP 125/75 | HR 89 | Temp 96.8°F | Ht 62.0 in | Wt 223.8 lb

## 2023-02-15 DIAGNOSIS — F4381 Prolonged grief disorder: Secondary | ICD-10-CM | POA: Diagnosis not present

## 2023-02-15 DIAGNOSIS — F251 Schizoaffective disorder, depressive type: Secondary | ICD-10-CM

## 2023-02-15 DIAGNOSIS — G4700 Insomnia, unspecified: Secondary | ICD-10-CM | POA: Insufficient documentation

## 2023-02-15 DIAGNOSIS — G4701 Insomnia due to medical condition: Secondary | ICD-10-CM

## 2023-02-15 MED ORDER — ARIPIPRAZOLE 2 MG PO TABS: ORAL_TABLET | ORAL | 3 refills | Status: DC

## 2023-02-15 NOTE — Progress Notes (Unsigned)
BH MD OP Progress Note  02/15/2023 11:36 AM Judith [name]  MRN:  [id number]  Chief Complaint:  Chief Complaint  Patient presents with   Follow-up   Anxiety   Depression   Medication Refill   HPI: Judith [name] is a 72 year old Caucasian female, widowed, lives in [place], currently on SSI, has a history of schizoaffective disorder, obstructive sleep apnea on CPAP, diabetes mellitus, hyperlipidemia, fibromyalgia, history of multiple surgeries was evaluated in office today.  Patient today reports she is currently doing overall good with regards to her depression.  Denies any significant anxiety or mood swings.  Patient denies any hallucinations, other perceptual disturbances.  She reports sleep as restless and she has to wake up several times at night to urinate.  She reports most days she does not feel rested when she wakes up in the morning.  She does have CPAP and has been using it regularly.  Patient also has a lot of pain, neck pain, knee pain.  She also had a recent fall when she tripped and fell while she was getting out of her sister's car in the driveway.  Patient reports she has a mild laceration to her right elbow which is currently covered by Band-Aid.  She reports it does hurt to some extent however otherwise she is fine.  Currently takes Tylenol which helps for her pain.  Patient currently denies any suicidality or homicidality.  Denies any side effects to medications.  Reports she is coping better with her grief reaction.  She still has her moments.  Patient denies any other concerns today.    Visit Diagnosis:    ICD-10-CM   1. Schizoaffective disorder, depressive type (HCC)  F25.1 ARIPiprazole (ABILIFY) 2 MG tablet    2. 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 Insomnia due to medical condition  G47.01    Multiple including sleep apnea on CPAP, pain      Past Psychiatric History: I have reviewed past psychiatric history from progress note on  01/29/2020.  Past Medical History:  Past Medical History:  Diagnosis Date   Anginal pain (HCC)    Arthritis    Asthma    Depression    Diabetes mellitus without complication (HCC)    Dysrhythmia    Fibromyalgia    Gastroparesis    GERD (gastroesophageal reflux disease)    Headache    Sleep apnea     Past Surgical History:  Procedure Laterality Date   ABDOMINAL HYSTERECTOMY     BLADDER REPAIR     CATARACT EXTRACTION Bilateral 02/08/2013   CHOLECYSTECTOMY     ESOPHAGOGASTRODUODENOSCOPY (EGD) WITH PROPOFOL N/A 04/01/2016   Procedure: ESOPHAGOGASTRODUODENOSCOPY (EGD) WITH PROPOFOL;  Surgeon: Scot Jun, MD;  Location: [facility] END[facility];  Service: Endoscopy;  Laterality: N/A;   rotator cuff surgery Right    TOTAL KNEE ARTHROPLASTY Left 04/24/2021   Procedure: TOTAL KNEE ARTHROPLASTY;  Surgeon: Kennedy Bucker, MD;  Location: [facility] ORS;  Service: Orthopedics;  Laterality: Left;   TUBAL LIGATION      Family Psychiatric History: Reviewed family psychiatric history from progress note on 01/29/2020.  Family History:  Family History  Problem Relation Age of Onset   Bipolar disorder Sister    Bipolar disorder Brother    Breast cancer Neg Hx     Social History: Reviewed social history from progress note on 01/29/2020. Social History   Socioeconomic History   Marital status: Widowed    Spouse name: Not on file   Number of children: 2  Years of education: Not on file   Highest education level: Not on file  Occupational History   Not on file  Tobacco Use   Smoking status: Never   Smokeless tobacco: Never  Vaping Use   Vaping status: Never Used  Substance and Sexual Activity   Alcohol use: Yes    Comment: averages lessthan 1 drink per week   Drug use: No   Sexual activity: Never  Other Topics Concern   Not on file  Social History Narrative   Retired   International aid/development worker of [employer] investment [employer] Strain: Low Risk  (11/09/2022)   Received from [facility]   Overall Financial Resource Strain (CARDIA)    Difficulty of Paying Living Expenses: Not hard at all  Food Insecurity: No Food Insecurity (11/09/2022)   Received from [facility]   Hunger Vital Sign    Worried About Running Out of Food in the Last Year: Never true    Ran Out of Food in the Last Year: Never true  Transportation Needs: No Transportation Needs (11/09/2022)   Received from [facility] - Transportation    In the past 12 months, has lack of transportation kept you from medical appointments or from getting medications?: No    Lack of Transportation (Non-Medical): No  Physical Activity: Not on file  Stress: Not on file  Social Connections: Not on file    Allergies:  Allergies  Allergen Reactions   Nortriptyline Nausea And Vomiting   Pravastatin Other (See Comments)    Muscle pain   Topiramate Other (See Comments)   Metformin Nausea Only    Metabolic Disorder Labs: No results found for: "HGBA1C", "MPG" Lab Results  Component Value Date   PROLACTIN 10.4 03/04/2021   No results found for: "CHOL", "TRIG", "HDL", "CHOLHDL", "VLDL", "LDLCALC" No results found for: "TSH"  Therapeutic Level Labs: No results found for: "LITHIUM" No results found for: "VALPROATE" No results found for: "CBMZ"  Current Medications: Current Outpatient Medications  Medication Sig Dispense Refill   albuterol (PROVENTIL HFA;VENTOLIN HFA) 108 (90 Base) MCG/ACT inhaler Inhale 2 puffs into the lungs every 6 (six) hours as needed for wheezing or shortness of breath.     azelastine (ASTELIN) 0.1 % nasal spray Place 1 spray into both nostrils daily as needed for allergies.     cetirizine (ZYRTEC) 10 MG tablet Take 10 mg by mouth daily.     clonazePAM (KLONOPIN) 0.5 MG tablet Take 1 tablet (0.5 mg total) by mouth as directed. Take 1-2 tablets once a week as needed for severe anxiety, grief reaction 8 tablet 1   Continuous Glucose Receiver  (DEXCOM G7 RECEIVER) DEVI Use 1 each once for 1 dose     Continuous Glucose Sensor (DEXCOM G7 SENSOR) MISC      Dulaglutide 4.5 MG/0.5ML SOPN Inject 4.5 mg into the skin once a week.     glipiZIDE (GLUCOTROL XL) 10 MG 24 hr tablet Take 20 mg by mouth daily with breakfast.     glucose blood test strip 1 each by Other route. Use as instructed     Insulin Pen Needle (PEN NEEDLES 31GX5/16") 31G X 8 MM MISC Use as directed Use twice daily as directed.     LANTUS SOLOSTAR 100 UNIT/ML Solostar Pen Inject 28 Units into the skin at bedtime.     metoprolol succinate (TOPROL-XL) 25 MG 24 hr tablet 25 mg daily.     Multiple Vitamin (MULTI-VITAMIN)  tablet Take 1 tablet by mouth daily.     Na Sulfate-K Sulfate-Mg Sulf 17.5-3.13-1.6 GM/177ML SOLN Take by mouth.     omeprazole (PRILOSEC) 40 MG capsule Take 40 mg by mouth daily.     oxybutynin (DITROPAN-XL) 5 MG 24 hr tablet Take 5 mg by mouth daily.     pioglitazone (ACTOS) 15 MG tablet Take 15 mg by mouth daily.     primidone (MYSOLINE) 50 MG tablet Take by mouth 4 (four) times daily.     SPIKEVAX 50 MCG/0.5ML SUSY Inject into the muscle.     venlafaxine XR (EFFEXOR-XR) 150 MG 24 hr capsule TAKE 1 CAPSULE EVERY DAY WITH BREAKFAST. TO BE COMBINED WITH 75MG  DAILY 90 capsule 3   venlafaxine XR (EFFEXOR-XR) 75 MG 24 hr capsule TAKE 1 CAPSULE EVERY DAY WITH BREAKFAST. TO BE COMBINED WITH 150MG  DAILY 90 capsule 3   ARIPiprazole (ABILIFY) 2 MG tablet TAKE 1 TABLET(2 MG) BY MOUTH DAILY 90 tablet 3   ezetimibe (ZETIA) 10 MG tablet Take 1 tablet by mouth daily.     linaclotide (LINZESS) 145 MCG CAPS capsule Take 145 mcg by mouth every other day.     rizatriptan (MAXALT) 10 MG tablet Take by mouth.     No current facility-administered medications for this visit.     Musculoskeletal: Strength & Muscle Tone: within normal limits Gait & Station: normal Patient leans: N/A  Psychiatric Specialty Exam: Review of Systems  Psychiatric/Behavioral:  Positive for sleep  disturbance.     Blood pressure 125/75, pulse 89, temperature (!) 96.8 F (36 C), temperature source Skin, height 5\' 2"  (1.575 m), weight 223 lb 12.8 oz (101.5 kg).Body mass index is 40.93 kg/m.  General Appearance: Fairly Groomed  Eye Contact:  Fair  Speech:  Clear and Coherent  Volume:  Normal  Mood:  Euthymic  Affect:  Appropriate  Thought Process:  Goal Directed and Descriptions of Associations: Intact  Orientation:  Full (Time, Place, and Person)  Thought Content: Logical   Suicidal Thoughts:  No  Homicidal Thoughts:  No  Memory:  Immediate;   Fair Recent;   Fair Remote;   Fair  Judgement:  Fair  Insight:  Fair  Psychomotor Activity:  Normal  Concentration:  Concentration: Fair and Attention Span: Fair  Recall:  [employer] of Knowledge: Fair  Language: Fair  Akathisia:  No  Handed:  Right  AIMS (if indicated): done  Assets:  Communication Skills Desire for Improvement Housing Social Support  ADL's:  Intact  Cognition: WNL  Sleep:   Restless   Screenings: Geneticist, molecular Office Visit from 06/04/2022 in [facility] Office Visit from 03/04/2022 in [facility] Office Visit from 12/15/2021 in [facility] Office Visit from 09/10/2021 in [facility] Office Visit from 06/04/2021 in [facility]  AIMS Total Score 0 0 0 0 0      GAD-7    Flowsheet Row Office Visit from 02/15/2023 in [facility] Office Visit from 09/15/2022 in [facility] Office Visit from 06/04/2022 in [facility] Counselor from 03/05/2022 in [facility] at [facility] Visit from 03/04/2022 in [facility]  Total GAD-7  Score 2 2 0 3 1      PHQ2-9    Flowsheet Row Office Visit from  02/15/2023 in [facility] Office Visit from 09/15/2022 in [facility] Office Visit from 06/04/2022 in [facility] Counselor from 06/03/2022 in [facility] at [facility] from 03/05/2022 in [facility] at [facility] Total Score 1 0 2 2 3   PHQ-9 Total Score -- 2 2 6 9       Flowsheet Row Office Visit from 02/15/2023 in [facility] Counselor from 11/12/2022 in [facility] at [facility] Visit from 09/15/2022 in [facility]  C-SSRS RISK CATEGORY Moderate Risk No Risk No Risk        Assessment and Plan: Judith [name] is a 72 year old Caucasian female, widowed, lives in [place], has a history of schizoaffective disorder, left knee total replacement surgery, recent left sided hand surgery, was evaluated in the office today.  Patient with mood symptoms currently stable on current medication regimen however does struggle with sleep likely multifactorial, plan as noted below.  Plan Schizoaffective disorder-stable Abilify 2 mg p.o. daily Effexor extended release 225 mg p.o. daily  Bereavement-stable Continue CBT as needed  Insomnia-unstable Patient encouraged to stay compliant on CPAP for OSA Discussed sleep hygiene techniques.  She will need sufficient pain management. Patient advised to restrict the amount of fluid at the end of the day due to her overactive bladder which affects her sleep at night We will consider adding a sleep medication as needed in the future.   Follow-up in clinic in 5 to 6 months or sooner if needed.   Consent: Patient/Guardian gives verbal consent for treatment and assignment of benefits for  services provided during this visit. Patient/Guardian expressed understanding and agreed to proceed.   This note was generated in part or whole with voice recognition software. Voice recognition is usually quite accurate but there are transcription errors that can and very often do occur. I apologize for any typographical errors that were not detected and corrected.    Jomarie Longs, MD 02/15/2023, 11:36 AM

## 2023-02-16 NOTE — Plan of Care (Signed)
[facility] Tonsillectomy/Adenoidectomy, Postoperative PEDS care plan entered in error.

## 2023-02-17 ENCOUNTER — Ambulatory Visit: Payer: Medicare PPO | Admitting: Anesthesiology

## 2023-02-17 ENCOUNTER — Ambulatory Visit
Admission: RE | Admit: 2023-02-17 | Discharge: 2023-02-17 | Disposition: A | Discharge status: Home / Self Care | Payer: Medicare PPO | Attending: Internal Medicine | Admitting: Internal Medicine

## 2023-02-17 ENCOUNTER — Encounter: Payer: Self-pay | Admitting: Internal Medicine

## 2023-02-17 ENCOUNTER — Encounter
Admission: RE | Disposition: A | Discharge status: Home / Self Care | Payer: Self-pay | Source: Home / Self Care | Attending: Internal Medicine

## 2023-02-17 DIAGNOSIS — G4730 Sleep apnea, unspecified: Secondary | ICD-10-CM | POA: Diagnosis not present

## 2023-02-17 DIAGNOSIS — Z794 Long term (current) use of insulin: Secondary | ICD-10-CM | POA: Insufficient documentation

## 2023-02-17 DIAGNOSIS — M797 Fibromyalgia: Secondary | ICD-10-CM | POA: Insufficient documentation

## 2023-02-17 DIAGNOSIS — K5900 Constipation, unspecified: Secondary | ICD-10-CM | POA: Diagnosis not present

## 2023-02-17 DIAGNOSIS — J45909 Unspecified asthma, uncomplicated: Secondary | ICD-10-CM | POA: Diagnosis not present

## 2023-02-17 DIAGNOSIS — E1122 Type 2 diabetes mellitus with diabetic chronic kidney disease: Secondary | ICD-10-CM | POA: Diagnosis not present

## 2023-02-17 DIAGNOSIS — Z7984 Long term (current) use of oral hypoglycemic drugs: Secondary | ICD-10-CM | POA: Insufficient documentation

## 2023-02-17 DIAGNOSIS — E1143 Type 2 diabetes mellitus with diabetic autonomic (poly)neuropathy: Secondary | ICD-10-CM | POA: Insufficient documentation

## 2023-02-17 DIAGNOSIS — Z7985 Long-term (current) use of injectable non-insulin antidiabetic drugs: Secondary | ICD-10-CM | POA: Insufficient documentation

## 2023-02-17 DIAGNOSIS — N183 Chronic kidney disease, stage 3 unspecified: Secondary | ICD-10-CM | POA: Diagnosis not present

## 2023-02-17 DIAGNOSIS — K3184 Gastroparesis: Secondary | ICD-10-CM | POA: Diagnosis not present

## 2023-02-17 DIAGNOSIS — I129 Hypertensive chronic kidney disease with stage 1 through stage 4 chronic kidney disease, or unspecified chronic kidney disease: Secondary | ICD-10-CM | POA: Diagnosis not present

## 2023-02-17 DIAGNOSIS — Z1211 Encounter for screening for malignant neoplasm of colon: Secondary | ICD-10-CM | POA: Diagnosis not present

## 2023-02-17 DIAGNOSIS — K219 Gastro-esophageal reflux disease without esophagitis: Secondary | ICD-10-CM | POA: Insufficient documentation

## 2023-02-17 DIAGNOSIS — K640 First degree hemorrhoids: Secondary | ICD-10-CM | POA: Diagnosis not present

## 2023-02-17 DIAGNOSIS — F1090 Alcohol use, unspecified, uncomplicated: Secondary | ICD-10-CM | POA: Diagnosis not present

## 2023-02-17 DIAGNOSIS — K648 Other hemorrhoids: Secondary | ICD-10-CM | POA: Diagnosis not present

## 2023-02-17 HISTORY — PX: COLONOSCOPY WITH PROPOFOL: SHX5780

## 2023-02-17 LAB — GLUCOSE, CAPILLARY: Glucose-Capillary: 162 mg/dL — ABNORMAL HIGH (ref 70–99)

## 2023-02-17 SURGERY — COLONOSCOPY WITH PROPOFOL
Anesthesia: General

## 2023-02-17 MED ORDER — PROPOFOL 1000 MG/100ML IV EMUL
INTRAVENOUS | Status: AC
  Filled 2023-02-17: qty 100

## 2023-02-17 MED ORDER — PHENYLEPHRINE 80 MCG/ML (10ML) SYRINGE FOR IV PUSH (FOR BLOOD PRESSURE SUPPORT)
PREFILLED_SYRINGE | INTRAVENOUS | Status: AC
  Filled 2023-02-17: qty 10

## 2023-02-17 MED ORDER — PROPOFOL 10 MG/ML IV BOLUS
INTRAVENOUS | Status: DC | PRN
  Administered 2023-02-17: 100 mg via INTRAVENOUS

## 2023-02-17 MED ORDER — PHENYLEPHRINE 80 MCG/ML (10ML) SYRINGE FOR IV PUSH (FOR BLOOD PRESSURE SUPPORT)
PREFILLED_SYRINGE | INTRAVENOUS | Status: DC | PRN
  Administered 2023-02-17: 50 ug via INTRAVENOUS

## 2023-02-17 MED ORDER — PROPOFOL 500 MG/50ML IV EMUL
INTRAVENOUS | Status: DC | PRN
  Administered 2023-02-17: 150 ug/kg/min via INTRAVENOUS

## 2023-02-17 MED ORDER — SODIUM CHLORIDE 0.9 % IV SOLN: INTRAVENOUS | Status: DC

## 2023-02-17 NOTE — Interval H&P Note (Signed)
History and Physical Interval Note:  02/17/2023 11:31 AM  Judith [name]  has presented today for surgery, with the diagnosis of V76.51 (ICD-9-CM) - Z12.11 (ICD-10-CM) - Colon cancer screening.  The various methods of treatment have been discussed with the patient and family. After consideration of risks, benefits and other options for treatment, the patient has consented to  Procedure(s): COLONOSCOPY WITH PROPOFOL (N/A) as a surgical intervention.  The patient's history has been reviewed, patient examined, no change in status, stable for surgery.  I have reviewed the patient's chart and labs.  Questions were answered to the patient's satisfaction.     Terry, [place]

## 2023-02-17 NOTE — H&P (Signed)
Outpatient short stay form Pre-procedure 02/17/2023 11:30 AM Judith [name], M.D.  Primary Physician: Marisue Ivan, M.D.  Reason for visit:  Colon cancer screening  History of present illness:  72 y/o female with hx of chronic constipation presents for colon cancer screening. Besides constipation, Patient denies diarrhea, rectal bleeding, weight loss or abdominal pain.      Current Facility-Administered Medications:    0.9 %  sodium chloride infusion, , Intravenous, Continuous, Jordell Outten, Boykin Nearing, MD  Medications Prior to Admission  Medication Sig Dispense Refill Last Dose   ARIPiprazole (ABILIFY) 2 MG tablet TAKE 1 TABLET(2 MG) BY MOUTH DAILY 90 tablet 3 02/16/2023   cetirizine (ZYRTEC) 10 MG tablet Take 10 mg by mouth daily.   02/16/2023   ezetimibe (ZETIA) 10 MG tablet Take 1 tablet by mouth daily.   02/16/2023   linaclotide (LINZESS) 145 MCG CAPS capsule Take 145 mcg by mouth every other day.   02/16/2023   venlafaxine XR (EFFEXOR-XR) 150 MG 24 hr capsule TAKE 1 CAPSULE EVERY DAY WITH BREAKFAST. TO BE COMBINED WITH 75MG  DAILY 90 capsule 3 02/16/2023   venlafaxine XR (EFFEXOR-XR) 75 MG 24 hr capsule TAKE 1 CAPSULE EVERY DAY WITH BREAKFAST. TO BE COMBINED WITH 150MG  DAILY 90 capsule 3 02/16/2023   albuterol (PROVENTIL HFA;VENTOLIN HFA) 108 (90 Base) MCG/ACT inhaler Inhale 2 puffs into the lungs every 6 (six) hours as needed for wheezing or shortness of breath.      azelastine (ASTELIN) 0.1 % nasal spray Place 1 spray into both nostrils daily as needed for allergies.      clonazePAM (KLONOPIN) 0.5 MG tablet Take 1 tablet (0.5 mg total) by mouth as directed. Take 1-2 tablets once a week as needed for severe anxiety, grief reaction 8 tablet 1    Continuous Glucose Receiver (DEXCOM G7 RECEIVER) DEVI Use 1 each once for 1 dose      Continuous Glucose Sensor (DEXCOM G7 SENSOR) MISC       Dulaglutide 4.5 MG/0.5ML SOPN Inject 4.5 mg into the skin once a week.   02/07/2023   glipiZIDE  (GLUCOTROL XL) 10 MG 24 hr tablet Take 20 mg by mouth daily with breakfast.      glucose blood test strip 1 each by Other route. Use as instructed      Insulin Pen Needle (PEN NEEDLES 31GX5/16") 31G X 8 MM MISC Use as directed Use twice daily as directed.      LANTUS SOLOSTAR 100 UNIT/ML Solostar Pen Inject 28 Units into the skin at bedtime.      metoprolol succinate (TOPROL-XL) 25 MG 24 hr tablet 25 mg daily.      Multiple Vitamin (MULTI-VITAMIN) tablet Take 1 tablet by mouth daily.      Na Sulfate-K Sulfate-Mg Sulf 17.5-3.13-1.6 GM/177ML SOLN Take by mouth.      omeprazole (PRILOSEC) 40 MG capsule Take 40 mg by mouth daily.      oxybutynin (DITROPAN-XL) 5 MG 24 hr tablet Take 5 mg by mouth daily.      pioglitazone (ACTOS) 15 MG tablet Take 15 mg by mouth daily.      primidone (MYSOLINE) 50 MG tablet Take by mouth 4 (four) times daily.      rizatriptan (MAXALT) 10 MG tablet Take by mouth.      SPIKEVAX 50 MCG/0.5ML SUSY Inject into the muscle.        Allergies  Allergen Reactions   Nortriptyline Nausea And Vomiting   Pravastatin Other (See Comments)    Muscle pain  Topiramate Other (See Comments)   Metformin Nausea Only     Past Medical History:  Diagnosis Date   Anginal pain (HCC)    Arthritis    Asthma    Depression    Diabetes mellitus without complication (HCC)    Dysrhythmia    Fibromyalgia    Gastroparesis    GERD (gastroesophageal reflux disease)    Headache    Sleep apnea     Review of systems:  Otherwise negative.    Physical Exam  Gen: Alert, oriented. Appears stated age.  HEENT: [place]/AT. PERRLA. Lungs: CTA, no wheezes. CV: RR nl S1, S2. Abd: soft, benign, no masses. BS+ Ext: No edema. Pulses 2+    Planned procedures: Proceed with colonoscopy. The patient understands the nature of the planned procedure, indications, risks, alternatives and potential complications including but not limited to bleeding, infection, perforation, damage to internal organs  and possible oversedation/side effects from anesthesia. The patient agrees and gives consent to proceed.  Please refer to procedure notes for findings, recommendations and patient disposition/instructions.     Angelus Hoopes K. Norma [name], M.D. Gastroenterology 02/17/2023  11:30 AM

## 2023-02-17 NOTE — Op Note (Signed)
[facility] Gastroenterology Patient Name: Judith [name] Procedure Date: 02/17/2023 12:20 PM MRN: [id number] Account #: [id number] Date of Birth: [birth year] Admit Type: Outpatient Age: 72 Room: [facility] ENDO ROOM 2 Gender: Female Note Status: Finalized Instrument Name: Prentice Docker [id number] Procedure:             Colonoscopy Indications:           Screening for colorectal malignant neoplasm Providers:             Royce Macadamia K. Norma Fredrickson MD, MD Referring MD:          Marisue Ivan (Referring MD) Medicines:             Propofol per Anesthesia Complications:         No immediate complications. Estimated blood loss: None. Procedure:             Pre-Anesthesia Assessment:                        - Prior to the procedure, a History and Physical was                         performed, and patient medications, allergies and                         sensitivities were reviewed. The patient's tolerance                         of previous anesthesia was reviewed.                        - The risks and benefits of the procedure and the                         sedation options and risks were discussed with the                         patient. All questions were answered and informed                         consent was obtained.                        - Patient identification and proposed procedure were                         verified prior to the procedure by the nurse. The                         procedure was verified in the procedure room.                        After obtaining informed consent, the colonoscope was                         passed under direct vision. Throughout the procedure,                         the patient's blood pressure, pulse, and oxygen  saturations were monitored continuously. The                         Colonoscope was introduced through the anus and                         advanced to the the cecum, identified by appendiceal                          orifice and ileocecal valve. The colonoscopy was                         performed without difficulty. The patient tolerated                         the procedure well. The quality of the bowel                         preparation was good. The ileocecal valve, appendiceal                         orifice, and rectum were photographed. Findings:      The perianal and digital rectal examinations were normal. Pertinent       negatives include normal sphincter tone and no palpable rectal lesions.      Non-bleeding internal hemorrhoids were found during retroflexion. The       hemorrhoids were Grade I (internal hemorrhoids that do not prolapse).      The exam was otherwise without abnormality. Impression:            - Non-bleeding internal hemorrhoids.                        - The examination was otherwise normal.                        - No specimens collected. Recommendation:        - Patient has a contact number available for                         emergencies. The signs and symptoms of potential                         delayed complications were discussed with the patient.                         Return to normal activities tomorrow. Written                         discharge instructions were provided to the patient.                        - Resume previous diet.                        - Continue present medications.                        - You do NOT require further colon cancer screening  measures (Annual stool testing (i.e. hemoccult, FIT,                         cologuard), sigmoidoscopy, colonoscopy or CT                         colonography). You should share this recommendation                         with your Primary Care provider.                        - Return to GI office PRN.                        - The findings and recommendations were discussed with                         the patient. Procedure Code(s):     --- Professional  ---                        U2725, Colorectal cancer screening; colonoscopy on                         individual not meeting criteria for high risk Diagnosis Code(s):     --- Professional ---                        K64.0, First degree hemorrhoids                        Z12.11, Encounter for screening for malignant neoplasm                         of colon CPT copyright 2022 American Medical Association. All rights reserved. The codes documented in this report are preliminary and upon coder review may  be revised to meet current compliance requirements. Attending Participation:      I personally performed the entire procedure. Stanton Kidney MD, MD 02/17/2023 12:42:33 PM This report has been signed electronically. Number of Addenda: 0 Note Initiated On: 02/17/2023 12:20 PM Scope Withdrawal Time: 0 hours 6 minutes 24 seconds  Total Procedure Duration: 0 hours 11 minutes 30 seconds  Estimated Blood Loss:  Estimated blood loss was minimal. Estimated blood                         loss: none.      [facility]

## 2023-02-17 NOTE — Anesthesia Preprocedure Evaluation (Signed)
Anesthesia Evaluation  Patient identified by MRN, date of birth, ID band Patient awake    Reviewed: Allergy & Precautions, NPO status , Patient's Chart, lab work & pertinent test results  Airway Mallampati: IV  TM Distance: <3 FB Neck ROM: full  Mouth opening: Limited Mouth Opening  Dental  (+) Teeth Intact   Pulmonary neg pulmonary ROS, sleep apnea and Continuous Positive Airway Pressure Ventilation    Pulmonary exam normal breath sounds clear to auscultation       Cardiovascular Exercise Tolerance: Poor hypertension, + angina  negative cardio ROS Normal cardiovascular exam Rhythm:Regular Rate:Normal     Neuro/Psych  Headaches   Depression    negative neurological ROS  negative psych ROS   GI/Hepatic negative GI ROS, Neg liver ROS,GERD  Medicated,,  Endo/Other  negative endocrine ROSdiabetes, Well Controlled, Type 1, Oral Hypoglycemic Agents  Morbid obesity  Renal/GU negative Renal ROS  negative genitourinary   Musculoskeletal   Abdominal  (+) + obese  Peds negative pediatric ROS (+)  Hematology negative hematology ROS (+)   Anesthesia Other Findings Past Medical History: No date: Anginal pain (HCC) No date: Arthritis No date: Asthma No date: Depression No date: Diabetes mellitus without complication (HCC) No date: Dysrhythmia No date: Fibromyalgia No date: Gastroparesis No date: GERD (gastroesophageal reflux disease) No date: Headache No date: Sleep apnea  Past Surgical History: No date: ABDOMINAL HYSTERECTOMY No date: BLADDER REPAIR 02/08/2013: CATARACT EXTRACTION; Bilateral No date: CHOLECYSTECTOMY 04/01/2016: ESOPHAGOGASTRODUODENOSCOPY (EGD) WITH PROPOFOL; N/A     Comment:  Procedure: ESOPHAGOGASTRODUODENOSCOPY (EGD) WITH               PROPOFOL;  Surgeon: Scot Jun, MD;  Location: [facility]              ENDOSCOPY;  Service: Endoscopy;  Laterality: N/A; No date: rotator cuff surgery;  Right 04/24/2021: TOTAL KNEE ARTHROPLASTY; Left     Comment:  Procedure: TOTAL KNEE ARTHROPLASTY;  Surgeon: Kennedy Bucker, MD;  Location: [facility] ORS;  Service: Orthopedics;               Laterality: Left; No date: TUBAL LIGATION  BMI    Body Mass Index: 40.93 kg/m      Reproductive/Obstetrics negative OB ROS                             Anesthesia Physical Anesthesia Plan  ASA: 3  Anesthesia Plan: General   Post-op Pain Management:    Induction: Intravenous  PONV Risk Score and Plan: Propofol infusion and TIVA  Airway Management Planned: Natural Airway and Nasal Cannula  Additional Equipment:   Intra-op Plan:   Post-operative Plan:   Informed Consent: I have reviewed the patients History and Physical, chart, labs and discussed the procedure including the risks, benefits and alternatives for the proposed anesthesia with the patient or authorized representative who has indicated his/her understanding and acceptance.     Dental Advisory Given  Plan Discussed with: CRNA and Surgeon  Anesthesia Plan Comments:        Anesthesia Quick Evaluation

## 2023-02-17 NOTE — Transfer of Care (Signed)
Immediate Anesthesia Transfer of Care Note  Patient: Judith [name] 2 Procedure(s) Performed: COLONOSCOPY WITH PROPOFOL  Patient Location: PACU  Anesthesia Type:General  Level of Consciousness: awake and sedated  Airway & Oxygen Therapy: Patient Spontanous Breathing and Patient connected to face mask oxygen  Post-op Assessment: Report given to RN and Post -op Vital signs reviewed and stable  Post vital signs: Reviewed and stable  Last Vitals:  Vitals Value Taken Time  BP    Temp    Pulse    Resp    SpO2      Last Pain:  Vitals:   02/17/23 1128  TempSrc: Temporal  PainSc: 0-No pain         Complications: No notable events documented.

## 2023-02-17 NOTE — Anesthesia Postprocedure Evaluation (Signed)
Anesthesia Post Note  Patient: Judith [name]  Procedure(s) Performed: COLONOSCOPY WITH PROPOFOL  Patient location during evaluation: PACU Anesthesia Type: General Level of consciousness: awake Pain management: pain level controlled Vital Signs Assessment: post-procedure vital signs reviewed and stable Respiratory status: spontaneous breathing Cardiovascular status: stable Anesthetic complications: no   No notable events documented.   Last Vitals:  Vitals:   02/17/23 1128 02/17/23 1242  BP: (!) 140/73 (!) 101/53  Pulse: 100 85  Resp: 18 16  Temp: (!) 36.4 C (!) (P) 35.8 C  SpO2: 97% 100%    Last Pain:  Vitals:   02/17/23 1242  TempSrc: Temporal  PainSc: Asleep                 VAN STAVEREN,Terrionna Bridwell

## 2023-02-18 ENCOUNTER — Encounter: Payer: Self-pay | Admitting: Internal Medicine

## 2023-03-05 ENCOUNTER — Other Ambulatory Visit: Payer: Self-pay | Admitting: Medical Genetics

## 2023-03-05 DIAGNOSIS — Z006 Encounter for examination for normal comparison and control in clinical research program: Secondary | ICD-10-CM

## 2023-03-08 ENCOUNTER — Other Ambulatory Visit
Admission: RE | Admit: 2023-03-08 | Discharge: 2023-03-08 | Disposition: A | Discharge status: Home / Self Care | Payer: Medicare PPO | Source: Ambulatory Visit | Attending: Medical Genetics | Admitting: Medical Genetics

## 2023-03-08 DIAGNOSIS — Z006 Encounter for examination for normal comparison and control in clinical research program: Secondary | ICD-10-CM | POA: Insufficient documentation

## 2023-03-09 DIAGNOSIS — R2681 Unsteadiness on feet: Secondary | ICD-10-CM | POA: Diagnosis not present

## 2023-03-09 DIAGNOSIS — R519 Headache, unspecified: Secondary | ICD-10-CM | POA: Diagnosis not present

## 2023-03-16 LAB — HELIX MOLECULAR SCREEN: Genetic Analysis Overall Interpretation: NEGATIVE

## 2023-03-16 LAB — GENECONNECT MOLECULAR SCREEN

## 2023-03-17 DIAGNOSIS — M47812 Spondylosis without myelopathy or radiculopathy, cervical region: Secondary | ICD-10-CM | POA: Diagnosis not present

## 2023-04-05 DIAGNOSIS — E669 Obesity, unspecified: Secondary | ICD-10-CM | POA: Diagnosis not present

## 2023-04-05 DIAGNOSIS — E1169 Type 2 diabetes mellitus with other specified complication: Secondary | ICD-10-CM | POA: Diagnosis not present

## 2023-04-06 DIAGNOSIS — M542 Cervicalgia: Secondary | ICD-10-CM | POA: Diagnosis not present

## 2023-04-06 DIAGNOSIS — G4486 Cervicogenic headache: Secondary | ICD-10-CM | POA: Diagnosis not present

## 2023-04-08 DIAGNOSIS — M542 Cervicalgia: Secondary | ICD-10-CM | POA: Diagnosis not present

## 2023-04-08 DIAGNOSIS — G4486 Cervicogenic headache: Secondary | ICD-10-CM | POA: Diagnosis not present

## 2023-04-15 DIAGNOSIS — M542 Cervicalgia: Secondary | ICD-10-CM | POA: Diagnosis not present

## 2023-04-15 DIAGNOSIS — G4486 Cervicogenic headache: Secondary | ICD-10-CM | POA: Diagnosis not present

## 2023-04-26 DIAGNOSIS — G4486 Cervicogenic headache: Secondary | ICD-10-CM | POA: Diagnosis not present

## 2023-04-26 DIAGNOSIS — M542 Cervicalgia: Secondary | ICD-10-CM | POA: Diagnosis not present

## 2023-04-29 DIAGNOSIS — H52223 Regular astigmatism, bilateral: Secondary | ICD-10-CM | POA: Diagnosis not present

## 2023-04-29 DIAGNOSIS — E119 Type 2 diabetes mellitus without complications: Secondary | ICD-10-CM | POA: Diagnosis not present

## 2023-04-29 DIAGNOSIS — Z961 Presence of intraocular lens: Secondary | ICD-10-CM | POA: Diagnosis not present

## 2023-04-29 DIAGNOSIS — H5213 Myopia, bilateral: Secondary | ICD-10-CM | POA: Diagnosis not present

## 2023-04-30 DIAGNOSIS — M542 Cervicalgia: Secondary | ICD-10-CM | POA: Diagnosis not present

## 2023-04-30 DIAGNOSIS — G4486 Cervicogenic headache: Secondary | ICD-10-CM | POA: Diagnosis not present

## 2023-04-30 DIAGNOSIS — M47812 Spondylosis without myelopathy or radiculopathy, cervical region: Secondary | ICD-10-CM | POA: Diagnosis not present

## 2023-05-03 DIAGNOSIS — G4486 Cervicogenic headache: Secondary | ICD-10-CM | POA: Diagnosis not present

## 2023-05-03 DIAGNOSIS — M542 Cervicalgia: Secondary | ICD-10-CM | POA: Diagnosis not present

## 2023-05-05 DIAGNOSIS — G4486 Cervicogenic headache: Secondary | ICD-10-CM | POA: Diagnosis not present

## 2023-05-05 DIAGNOSIS — M542 Cervicalgia: Secondary | ICD-10-CM | POA: Diagnosis not present

## 2023-05-05 DIAGNOSIS — E785 Hyperlipidemia, unspecified: Secondary | ICD-10-CM | POA: Diagnosis not present

## 2023-05-05 DIAGNOSIS — Z862 Personal history of diseases of the blood and blood-forming organs and certain disorders involving the immune mechanism: Secondary | ICD-10-CM | POA: Diagnosis not present

## 2023-05-05 DIAGNOSIS — E1169 Type 2 diabetes mellitus with other specified complication: Secondary | ICD-10-CM | POA: Diagnosis not present

## 2023-05-10 DIAGNOSIS — M542 Cervicalgia: Secondary | ICD-10-CM | POA: Diagnosis not present

## 2023-05-10 DIAGNOSIS — G4486 Cervicogenic headache: Secondary | ICD-10-CM | POA: Diagnosis not present

## 2023-05-12 DIAGNOSIS — N3941 Urge incontinence: Secondary | ICD-10-CM | POA: Diagnosis not present

## 2023-05-12 DIAGNOSIS — G4733 Obstructive sleep apnea (adult) (pediatric): Secondary | ICD-10-CM | POA: Diagnosis not present

## 2023-05-12 DIAGNOSIS — E1169 Type 2 diabetes mellitus with other specified complication: Secondary | ICD-10-CM | POA: Diagnosis not present

## 2023-05-12 DIAGNOSIS — M79672 Pain in left foot: Secondary | ICD-10-CM | POA: Diagnosis not present

## 2023-05-12 DIAGNOSIS — E785 Hyperlipidemia, unspecified: Secondary | ICD-10-CM | POA: Diagnosis not present

## 2023-05-12 DIAGNOSIS — Z6841 Body Mass Index (BMI) 40.0 and over, adult: Secondary | ICD-10-CM | POA: Diagnosis not present

## 2023-05-16 DIAGNOSIS — G4733 Obstructive sleep apnea (adult) (pediatric): Secondary | ICD-10-CM | POA: Diagnosis not present

## 2023-05-17 DIAGNOSIS — M542 Cervicalgia: Secondary | ICD-10-CM | POA: Diagnosis not present

## 2023-05-17 DIAGNOSIS — G4486 Cervicogenic headache: Secondary | ICD-10-CM | POA: Diagnosis not present

## 2023-05-23 ENCOUNTER — Other Ambulatory Visit: Payer: Self-pay | Admitting: Psychiatry

## 2023-05-23 DIAGNOSIS — F251 Schizoaffective disorder, depressive type: Secondary | ICD-10-CM

## 2023-05-23 MED ORDER — VENLAFAXINE HCL ER 150 MG PO CP24: 150.0000 mg | ORAL_CAPSULE | Freq: Every day | ORAL | 3 refills | Status: AC

## 2023-05-23 NOTE — Telephone Encounter (Signed)
I have sent Venlafaxine refills to [facility].

## 2023-05-25 DIAGNOSIS — M542 Cervicalgia: Secondary | ICD-10-CM | POA: Diagnosis not present

## 2023-05-25 DIAGNOSIS — G4486 Cervicogenic headache: Secondary | ICD-10-CM | POA: Diagnosis not present

## 2023-05-31 DIAGNOSIS — M542 Cervicalgia: Secondary | ICD-10-CM | POA: Diagnosis not present

## 2023-05-31 DIAGNOSIS — G4486 Cervicogenic headache: Secondary | ICD-10-CM | POA: Diagnosis not present

## 2023-06-02 DIAGNOSIS — R2681 Unsteadiness on feet: Secondary | ICD-10-CM | POA: Diagnosis not present

## 2023-06-02 DIAGNOSIS — H8112 Benign paroxysmal vertigo, left ear: Secondary | ICD-10-CM | POA: Diagnosis not present

## 2023-06-07 DIAGNOSIS — H8112 Benign paroxysmal vertigo, left ear: Secondary | ICD-10-CM | POA: Diagnosis not present

## 2023-06-07 DIAGNOSIS — R2681 Unsteadiness on feet: Secondary | ICD-10-CM | POA: Diagnosis not present

## 2023-06-09 DIAGNOSIS — H8112 Benign paroxysmal vertigo, left ear: Secondary | ICD-10-CM | POA: Diagnosis not present

## 2023-06-09 DIAGNOSIS — R2681 Unsteadiness on feet: Secondary | ICD-10-CM | POA: Diagnosis not present

## 2023-06-14 DIAGNOSIS — R2681 Unsteadiness on feet: Secondary | ICD-10-CM | POA: Diagnosis not present

## 2023-06-14 DIAGNOSIS — H8112 Benign paroxysmal vertigo, left ear: Secondary | ICD-10-CM | POA: Diagnosis not present

## 2023-06-20 NOTE — Progress Notes (Unsigned)
 [facility] [address] [place], [place] [address]  Pulmonary Sleep Medicine   Office Visit Note  Patient Name: Judith [name] DOB: 11/09/1950 MRN [id number]    Chief Complaint: Obstructive Sleep Apnea visit  Brief History:  Judith [name] presents for an initial consult for sleep evaluation and to establish care. The patient has a 24 year history of sleep apnea and is currently on a CPAP. Prior to using a PAP, sleep quality was poor. This was noted most nights. The patient reported the following symptoms: snoring, witnessed apneic pauses, headaches, trouble concentrating, brain fog, excessive daytime sleepiness and fatigue . The patient goes to sleep at 0930 pm and wakes up at 0800 am. The patient relates depression as a history of psychiatric problems. The Epworth Sleepiness Score is 9 out of 24 . The patient relates  Cardiovascular risk factors include: palpitations. The patient is currently on a APAP@ 4-15 cmH2O. The patient reports using her PAP and feels rested after sleeping with PAP.  The patient reports benefiting from PAP use and would like for her to continue using PAP. Reported sleepiness is  improved. The compliance download shows  95% compliance with an average use time of 10 hours 16  minutes. The AHI is 9.3.  The patient continues to require PAP therapy as a medical necessity in order to eliminate her sleep apnea. The patient is in need of a replacement unit as her current machine has exceeded maximum motor lifetime expectancy. Unit is also out of warranty.   ROS  General: (-) fever, (-) chills, (-) night sweat Nose and Sinuses: (-) nasal stuffiness or itchiness, (-) postnasal drip, (-) nosebleeds, (-) sinus trouble. Mouth and Throat: (-) sore throat, (-) hoarseness. Neck: (-) swollen glands, (-) enlarged thyroid, (-) neck pain. Respiratory: - cough, + shortness of breath, - wheezing. Neurologic: - numbness, - tingling. Psychiatric: + anxiety, +  depression   Current Medication: Outpatient Encounter Medications as of 06/21/2023  Medication Sig Note   EMGALITY 120 MG/ML SOAJ Inject into the skin.    MOUNJARO 7.5 MG/0.5ML Pen Inject into the skin.    albuterol (PROVENTIL HFA;VENTOLIN HFA) 108 (90 Base) MCG/ACT inhaler Inhale 2 puffs into the lungs every 6 (six) hours as needed for wheezing or shortness of breath.    ARIPiprazole (ABILIFY) 2 MG tablet TAKE 1 TABLET(2 MG) BY MOUTH DAILY    azelastine (ASTELIN) 0.1 % nasal spray Place 1 spray into both nostrils daily as needed for allergies.    cetirizine (ZYRTEC) 10 MG tablet Take 10 mg by mouth daily.    Continuous Glucose Receiver (DEXCOM G7 RECEIVER) DEVI Use 1 each once for 1 dose    Continuous Glucose Sensor (DEXCOM G7 SENSOR) MISC     ezetimibe (ZETIA) 10 MG tablet Take 1 tablet by mouth daily.    glipiZIDE (GLUCOTROL XL) 10 MG 24 hr tablet Take 20 mg by mouth daily with breakfast.    glucose blood test strip 1 each by Other route. Use as instructed    Insulin Pen Needle (PEN NEEDLES 31GX5/16") 31G X 8 MM MISC Use as directed Use twice daily as directed.    LANTUS SOLOSTAR 100 UNIT/ML Solostar Pen Inject 28 Units into the skin at bedtime. 04/24/2021: 14 units last night   linaclotide (LINZESS) 145 MCG CAPS capsule Take 145 mcg by mouth every other day.    metoprolol succinate (TOPROL-XL) 25 MG 24 hr tablet 25 mg daily. 04/24/2021: At 7:00   Multiple Vitamin (MULTI-VITAMIN) tablet Take 1 tablet  by mouth daily.    Na Sulfate-K Sulfate-Mg Sulf 17.5-3.13-1.6 GM/177ML SOLN Take by mouth.    omeprazole (PRILOSEC) 40 MG capsule Take 40 mg by mouth daily.    oxybutynin (DITROPAN-XL) 5 MG 24 hr tablet Take 5 mg by mouth daily.    pioglitazone (ACTOS) 15 MG tablet Take 15 mg by mouth daily.    primidone (MYSOLINE) 50 MG tablet Take by mouth 4 (four) times daily.    rizatriptan (MAXALT) 10 MG tablet Take by mouth.    venlafaxine XR (EFFEXOR-XR) 150 MG 24 hr capsule Take 1 capsule (150 mg  total) by mouth daily with breakfast. Take along with 75 mg daily    venlafaxine XR (EFFEXOR-XR) 75 MG 24 hr capsule TAKE 1 CAPSULE EVERY DAY WITH BREAKFAST. TO BE COMBINED WITH 150MG  DAILY    [DISCONTINUED] clonazePAM (KLONOPIN) 0.5 MG tablet Take 1 tablet (0.5 mg total) by mouth as directed. Take 1-2 tablets once a week as needed for severe anxiety, grief reaction    [DISCONTINUED] Dulaglutide 4.5 MG/0.5ML SOPN Inject 4.5 mg into the skin once a week.    [DISCONTINUED] SPIKEVAX 50 MCG/0.5ML SUSY Inject into the muscle.    No facility-administered encounter medications on file as of 06/21/2023.    Surgical History: Past Surgical History:  Procedure Laterality Date   ABDOMINAL HYSTERECTOMY     BLADDER REPAIR     CATARACT EXTRACTION Bilateral 02/08/2013   CHOLECYSTECTOMY     COLONOSCOPY WITH PROPOFOL N/A 02/17/2023   Procedure: COLONOSCOPY WITH PROPOFOL;  Surgeon: Toledo, Boykin Nearing, MD;  Location: [facility] ENDOSCOPY;  Service: Gastroenterology;  Laterality: N/A;   ESOPHAGOGASTRODUODENOSCOPY (EGD) WITH PROPOFOL N/A 04/01/2016   Procedure: ESOPHAGOGASTRODUODENOSCOPY (EGD) WITH PROPOFOL;  Surgeon: Scot Jun, MD;  Location: [facility] ENDOSCOPY;  Service: Endoscopy;  Laterality: N/A;   rotator cuff surgery Right    TOTAL KNEE ARTHROPLASTY Left 04/24/2021   Procedure: TOTAL KNEE ARTHROPLASTY;  Surgeon: Kennedy Bucker, MD;  Location: [facility] ORS;  Service: Orthopedics;  Laterality: Left;   TUBAL LIGATION      Medical History: Past Medical History:  Diagnosis Date   Anginal pain (HCC)    Arthritis    Asthma    Depression    Diabetes mellitus without complication (HCC)    Dysrhythmia    Fibromyalgia    Gastroparesis    GERD (gastroesophageal reflux disease)    Headache    Sleep apnea     Family History: Non contributory to the present illness  Social History: Social History   Socioeconomic History   Marital status: Widowed    Spouse name: Not on file   Number of children: 2   Years  of education: Not on file   Highest education level: Not on file  Occupational History   Not on file  Tobacco Use   Smoking status: Never   Smokeless tobacco: Never  Vaping Use   Vaping status: Never Used  Substance and Sexual Activity   Alcohol use: Yes    Comment: averages lessthan 1 drink per week   Drug use: No   Sexual activity: Never  Other Topics Concern   Not on file  Social History Narrative   Retired   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 Strain: Low Risk  (11/09/2022)   Received from [facility]   Overall Financial Resource Strain (CARDIA)    Difficulty of Paying Living Expenses: Not hard at all  Food Insecurity: No Food Insecurity (11/09/2022)   [facility]   Hunger Vital Sign    Worried About Running Out of Food in the Last Year: Never true    Ran Out of Food in the Last Year: Never true  Transportation Needs: No Transportation Needs (11/09/2022)   Received from [facility] - Transportation    In the past 12 months, has lack of transportation kept you from medical appointments or from getting medications?: No    Lack of Transportation (Non-Medical): No  Physical Activity: Not on file  Stress: Not on file  Social Connections: Not on file  Intimate Partner Violence: Not on file    Vital Signs: Blood pressure 113/70, pulse 93, resp. rate 16, height 5\' 2"  (1.575 m), weight 225 lb (102.1 kg), SpO2 97%. Body mass index is 41.15 kg/m.    Examination: General Appearance: The patient is well-developed, well-nourished, and in no distress. Neck Circumference: 45 cm Skin: Gross inspection of skin unremarkable. Head: normocephalic, no gross deformities. Eyes: no gross deformities noted. ENT: ears appear grossly normal Neurologic: Alert and oriented. No involuntary movements.  STOP BANG RISK ASSESSMENT S (snore) Have you been told that you snore?     NO   T (tired) Are you  often tired, fatigued, or sleepy during the day?   NO  O (obstruction) Do you stop breathing, choke, or gasp during sleep? NO   P (pressure) Do you have or are you being treated for high blood pressure? YES   B (BMI) Is your body index greater than 35 kg/m? YES   A (age) Are you 9 years old or older? YES   N (neck) Do you have a neck circumference greater than 16 inches?   YES   G (gender) Are you a female? NO   TOTAL STOP/BANG "YES" ANSWERS 4       A STOP-Bang score of 2 or less is considered low risk, and a score of 5 or more is high risk for having either moderate or severe OSA. For people who score 3 or 4, doctors may need to perform further assessment to determine how likely they are to have OSA.         EPWORTH SLEEPINESS SCALE:  Scale:  (0)= no chance of dozing; (1)= slight chance of dozing; (2)= moderate chance of dozing; (3)= high chance of dozing  Chance  Situtation    Sitting and reading: 2    Watching TV: 2    Sitting Inactive in public: 0    As a passenger in car: 1      Lying down to rest: 3    Sitting and talking: 0    Sitting quielty after lunch: 1    In a car, stopped in traffic: 0   TOTAL SCORE:   9 out of 24    SLEEP STUDIES:  PSG (09/04/1999)- RDI 9, min SP02 85% Titration ( 11/04/2008)  CPAP@17  with positional therapy recommended   CPAP COMPLIANCE DATA:  Date Range: 06/21/2022-06/20/2023  Average Daily Use: 10 hours 16 minutes  Median Use: 10 hours 13 minutes  Compliance for > 4 Hours: 95%  AHI: 9.3 respiratory events per hour  Days Used: 348/365 days  Mask Leak: 96.3  95th Percentile Pressure: 14.1         LABS: No results found for this or any previous visit (from the past 2160 hours).  Radiology: No results found.  No results found.  No results found.    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   Insomnia 02/15/2023   Osteopenia of lumbar spine 07/15/2021   S/P TKR (total knee replacement) using cement, left 04/24/2021   Aortic atherosclerosis (HCC) 04/22/2021   High risk medication use 03/04/2021   Urge incontinence of urine 10/25/2020   Unstable gait 09/04/2020   Numbness of left foot 04/16/2020   Statin intolerance 02/13/2020   CKD (chronic kidney disease) stage 3, GFR 30-59 ml/min (HCC) 02/13/2020   Diabetes (HCC) 01/29/2020   Sleep apnea 01/29/2020   Asthma 01/29/2020   Hyperlipidemia 01/29/2020   GERD (gastroesophageal reflux disease) 01/29/2020   Schizoaffective disorder, depressive type (HCC) 01/29/2020   Cognitive disorder 01/29/2020   Bereavement 01/29/2020   History of palpitations 08/14/2019   Hyperlipidemia associated with type 2 diabetes mellitus (HCC) 06/14/2019   Encounter for general adult medical examination without abnormal findings 01/12/2019   Class 2 severe obesity due to excess calories with serious comorbidity and body mass index (BMI) of 36.0 to 36.9 in adult (HCC) 06/15/2018   Morbid obesity with BMI of 40.0-44.9, adult (HCC) 06/15/2018   Bilateral occipital neuralgia 03/09/2018   Dizziness 12/07/2017   Controlled type 2 diabetes mellitus with stage 3 chronic kidney disease, without long-term current use of insulin (HCC) 09/01/2017   Diabetic polyneuropathy associated with type 2 diabetes mellitus (HCC) 09/01/2017   Bilateral carpal tunnel syndrome 03/23/2017   Tremor 10/13/2016   Memory loss due to medical condition 10/13/2016   Headache disorder 10/13/2016   Imbalance 08/23/2016   H/O vitamin D deficiency 10/02/2015   DDD (degenerative disc disease), cervical 05/22/2011   Obstructive sleep apnea on CPAP 01/08/2011   Rosacea 01/08/2011   Migraine headache 01/08/2011   Fibromyalgia 01/08/2011    1. OSA on CPAP (Primary) The patient does tolerate PAP and reports  benefit from PAP use. She has not had a sleep study in  over 20 years. Her apnea is not optimally controlled with an AHI of 9.3. Recommend a PSG and titration to determine optimal treatment. Her machine is past end of life, out of warranty, and must be replaced. The patient was reminded how to clean equipment and advised to replace supplies routinely. The patient was also counselled on weight loss. The compliance is excellent. The AHI is 9.3.   OSA- uncontrolled. PSG followed by titration, then machine replacement. F/u after setup.   2. CPAP use counseling CPAP Counseling: had a lengthy discussion with the patient regarding the importance of PAP therapy in management of the sleep apnea. Patient appears to understand the risk factor reduction and also understands the risks associated with untreated sleep apnea. Patient will try to make a good faith effort to remain compliant with therapy. Also instructed the patient on proper cleaning of the device including the water must be changed daily if possible and use of distilled water is preferred. Patient understands that the machine should be regularly cleaned with appropriate recommended cleaning solutions that do not damage the PAP machine for example given white vinegar and water rinses. Other methods such as ozone treatment may not be as good as these simple methods to achieve cleaning.   3. Morbid obesity with BMI of 40.0-44.9, adult (HCC) Obesity Counseling: Had a lengthy discussion regarding patients BMI and weight issues. Patient was instructed on portion control as well as increased activity. Also discussed caloric restrictions with trying to maintain intake less  than 2000 Kcal. Discussions were made in accordance with the 5As of weight management. Simple actions such as not eating late and if able to, taking a walk is suggested.     General Counseling: I have discussed the findings of the evaluation and examination with [name].  I have also discussed any further diagnostic evaluation thatmay be needed  or ordered today. Haddy verbalizes understanding of the findings of todays visit. We also reviewed her medications today and discussed drug interactions and side effects including but not limited excessive drowsiness and altered mental states. We also discussed that there is always a risk not just to her but also people around her. she has been encouraged to call the office with any questions or concerns that should arise related to todays visit.  No orders of the defined types were placed in this encounter.       I have personally obtained a history, examined the patient, evaluated laboratory and imaging results, formulated the assessment and plan and placed orders. This patient was seen today by Emmaline Kluver, PA-C in collaboration with Dr. Freda Munro.   Yevonne Pax, MD [facility] Diplomate ABMS Pulmonary Critical Care Medicine and Sleep Medicine

## 2023-06-21 ENCOUNTER — Ambulatory Visit (INDEPENDENT_AMBULATORY_CARE_PROVIDER_SITE_OTHER): Payer: Medicare PPO | Admitting: Internal Medicine

## 2023-06-21 VITALS — BP 113/70 | HR 93 | Resp 16 | Ht 62.0 in | Wt 225.0 lb

## 2023-06-21 DIAGNOSIS — G4733 Obstructive sleep apnea (adult) (pediatric): Secondary | ICD-10-CM | POA: Insufficient documentation

## 2023-06-21 DIAGNOSIS — H8112 Benign paroxysmal vertigo, left ear: Secondary | ICD-10-CM | POA: Diagnosis not present

## 2023-06-21 DIAGNOSIS — Z7189 Other specified counseling: Secondary | ICD-10-CM | POA: Insufficient documentation

## 2023-06-21 DIAGNOSIS — R2681 Unsteadiness on feet: Secondary | ICD-10-CM | POA: Diagnosis not present

## 2023-06-21 NOTE — Patient Instructions (Signed)

## 2023-06-23 DIAGNOSIS — R2681 Unsteadiness on feet: Secondary | ICD-10-CM | POA: Diagnosis not present

## 2023-06-23 DIAGNOSIS — H8112 Benign paroxysmal vertigo, left ear: Secondary | ICD-10-CM | POA: Diagnosis not present

## 2023-07-02 DIAGNOSIS — M7661 Achilles tendinitis, right leg: Secondary | ICD-10-CM | POA: Diagnosis not present

## 2023-07-02 DIAGNOSIS — M19072 Primary osteoarthritis, left ankle and foot: Secondary | ICD-10-CM | POA: Diagnosis not present

## 2023-07-02 DIAGNOSIS — M216X2 Other acquired deformities of left foot: Secondary | ICD-10-CM | POA: Diagnosis not present

## 2023-07-02 DIAGNOSIS — E1142 Type 2 diabetes mellitus with diabetic polyneuropathy: Secondary | ICD-10-CM | POA: Diagnosis not present

## 2023-07-02 DIAGNOSIS — M79672 Pain in left foot: Secondary | ICD-10-CM | POA: Diagnosis not present

## 2023-07-02 DIAGNOSIS — M7732 Calcaneal spur, left foot: Secondary | ICD-10-CM | POA: Diagnosis not present

## 2023-07-02 DIAGNOSIS — M216X1 Other acquired deformities of right foot: Secondary | ICD-10-CM | POA: Diagnosis not present

## 2023-07-02 DIAGNOSIS — M7731 Calcaneal spur, right foot: Secondary | ICD-10-CM | POA: Diagnosis not present

## 2023-07-02 DIAGNOSIS — M79671 Pain in right foot: Secondary | ICD-10-CM | POA: Diagnosis not present

## 2023-07-13 DIAGNOSIS — K3184 Gastroparesis: Secondary | ICD-10-CM | POA: Diagnosis not present

## 2023-07-13 DIAGNOSIS — K5904 Chronic idiopathic constipation: Secondary | ICD-10-CM | POA: Diagnosis not present

## 2023-07-13 DIAGNOSIS — K30 Functional dyspepsia: Secondary | ICD-10-CM | POA: Diagnosis not present

## 2023-07-13 DIAGNOSIS — E1143 Type 2 diabetes mellitus with diabetic autonomic (poly)neuropathy: Secondary | ICD-10-CM | POA: Diagnosis not present

## 2023-07-13 DIAGNOSIS — K219 Gastro-esophageal reflux disease without esophagitis: Secondary | ICD-10-CM | POA: Diagnosis not present

## 2023-07-29 ENCOUNTER — Encounter (INDEPENDENT_AMBULATORY_CARE_PROVIDER_SITE_OTHER): Payer: Self-pay | Admitting: Internal Medicine

## 2023-07-29 DIAGNOSIS — G4733 Obstructive sleep apnea (adult) (pediatric): Secondary | ICD-10-CM

## 2023-07-30 DIAGNOSIS — E1142 Type 2 diabetes mellitus with diabetic polyneuropathy: Secondary | ICD-10-CM | POA: Diagnosis not present

## 2023-07-30 DIAGNOSIS — M7732 Calcaneal spur, left foot: Secondary | ICD-10-CM | POA: Diagnosis not present

## 2023-07-30 DIAGNOSIS — M19072 Primary osteoarthritis, left ankle and foot: Secondary | ICD-10-CM | POA: Diagnosis not present

## 2023-07-30 DIAGNOSIS — M216X1 Other acquired deformities of right foot: Secondary | ICD-10-CM | POA: Diagnosis not present

## 2023-07-30 DIAGNOSIS — M79671 Pain in right foot: Secondary | ICD-10-CM | POA: Diagnosis not present

## 2023-07-30 DIAGNOSIS — M216X2 Other acquired deformities of left foot: Secondary | ICD-10-CM | POA: Diagnosis not present

## 2023-07-30 DIAGNOSIS — M722 Plantar fascial fibromatosis: Secondary | ICD-10-CM | POA: Diagnosis not present

## 2023-07-30 DIAGNOSIS — M7731 Calcaneal spur, right foot: Secondary | ICD-10-CM | POA: Diagnosis not present

## 2023-07-30 DIAGNOSIS — M7661 Achilles tendinitis, right leg: Secondary | ICD-10-CM | POA: Diagnosis not present

## 2023-08-04 DIAGNOSIS — M79671 Pain in right foot: Secondary | ICD-10-CM | POA: Diagnosis not present

## 2023-08-04 DIAGNOSIS — M25571 Pain in right ankle and joints of right foot: Secondary | ICD-10-CM | POA: Diagnosis not present

## 2023-08-04 DIAGNOSIS — S93401A Sprain of unspecified ligament of right ankle, initial encounter: Secondary | ICD-10-CM | POA: Diagnosis not present

## 2023-08-04 DIAGNOSIS — S93601A Unspecified sprain of right foot, initial encounter: Secondary | ICD-10-CM | POA: Diagnosis not present

## 2023-08-09 ENCOUNTER — Ambulatory Visit: Payer: Self-pay | Admitting: Urology

## 2023-08-09 VITALS — BP 127/79 | HR 90 | Ht 62.0 in | Wt 228.8 lb

## 2023-08-09 DIAGNOSIS — N3941 Urge incontinence: Secondary | ICD-10-CM

## 2023-08-09 DIAGNOSIS — N3946 Mixed incontinence: Secondary | ICD-10-CM

## 2023-08-09 LAB — MICROSCOPIC EXAMINATION: Epithelial Cells (non renal): >10 /HPF — AB (ref 0–10)

## 2023-08-09 LAB — URINALYSIS, COMPLETE
Bilirubin, UA: NEGATIVE
Glucose, UA: NEGATIVE
Ketones, UA: NEGATIVE
Nitrite, UA: NEGATIVE
RBC, UA: NEGATIVE
Specific Gravity, UA: 1.02 (ref 1.005–1.030)
Urobilinogen, Ur: 1 mg/dL (ref 0.2–1.0)
pH, UA: 7 (ref 5.0–7.5)

## 2023-08-09 MED ORDER — GEMTESA 75 MG PO TABS: 75.0000 mg | ORAL_TABLET | Freq: Every day | ORAL | Status: DC

## 2023-08-09 NOTE — Progress Notes (Signed)
 08/09/2023 8:41 AM   Judith [name] 08-06-1950 [id number]  Referring provider: Marisue Ivan, MD [phone] [facility] [address] [facility] [place],  [place] [address]  Chief Complaint  Patient presents with   Establish Care   Urinary Incontinence    HPI: I was consulted to assess the patient's urine incontinence.  She leaks with coughing sneezing and perhaps bending lifting.  Primary symptom is urge incontinence.  She leaks when she goes from sitting to standing position.  No bedwetting.  She does not wear pads for changes special underwear  She voids every 2 hours gets up 2 or more times at night.  Flow was good  She has had a previous sling.  She is an insulin-dependent diabetic.  She has had a hysterectomy  Currently on nonresponder to oxybutynin  No history of kidney stones or bladder infections.  She is prone to constipation.   PMH: Past Medical History:  Diagnosis Date   Anginal pain (HCC)    Arthritis    Asthma    Depression    Diabetes mellitus without complication (HCC)    Dysrhythmia    Fibromyalgia    Gastroparesis    GERD (gastroesophageal reflux disease)    Headache    Sleep apnea     Surgical History: Past Surgical History:  Procedure Laterality Date   ABDOMINAL HYSTERECTOMY     BLADDER REPAIR     CATARACT EXTRACTION Bilateral 02/08/2013   CHOLECYSTECTOMY     COLONOSCOPY WITH PROPOFOL N/A 02/17/2023   Procedure: COLONOSCOPY WITH PROPOFOL;  Surgeon: Toledo, Boykin Nearing, MD;  Location: [facility] ENDOSCOPY;  Service: Gastroenterology;  Laterality: N/A;   ESOPHAGOGASTRODUODENOSCOPY (EGD) WITH PROPOFOL N/A 04/01/2016   Procedure: ESOPHAGOGASTRODUODENOSCOPY (EGD) WITH PROPOFOL;  Surgeon: Scot Jun, MD;  Location: [facility] ENDOSCOPY;  Service: Endoscopy;  Laterality: N/A;   rotator cuff surgery Right    TOTAL KNEE ARTHROPLASTY Left 04/24/2021   Procedure: TOTAL KNEE ARTHROPLASTY;  Surgeon: Kennedy Bucker, MD;  Location: [facility] ORS;  Service: Orthopedics;   Laterality: Left;   TUBAL LIGATION      Home Medications:  Allergies as of 08/09/2023       Reactions   Nortriptyline Nausea And Vomiting   Pravastatin Other (See Comments)   Muscle pain   Topiramate Other (See Comments)   Metformin Nausea Only        Medication List        Accurate as of August 09, 2023  8:41 AM. If you have any questions, ask your nurse or doctor.          STOP taking these medications    Na Sulfate-K Sulfate-Mg Sulfate concentrate 17.5-3.13-1.6 GM/177ML Soln Commonly known as: SUPREP       TAKE these medications    albuterol 108 (90 Base) MCG/ACT inhaler Commonly known as: VENTOLIN HFA Inhale 2 puffs into the lungs every 6 (six) hours as needed for wheezing or shortness of breath.   ARIPiprazole 2 MG tablet Commonly known as: ABILIFY TAKE 1 TABLET(2 MG) BY MOUTH DAILY   azelastine 0.1 % nasal spray Commonly known as: ASTELIN Place 1 spray into both nostrils daily as needed for allergies.   cetirizine 10 MG tablet Commonly known as: ZYRTEC Take 10 mg by mouth daily.   Dexcom G7 Receiver Devi Use 1 each once for 1 dose   Dexcom G7 Sensor Misc   Emgality 120 MG/ML Soaj Generic drug: Galcanezumab-gnlm Inject into the skin.   ezetimibe 10 MG tablet Commonly known as: ZETIA Take 1  tablet by mouth daily.   glipiZIDE 10 MG 24 hr tablet Commonly known as: GLUCOTROL XL Take 20 mg by mouth daily with breakfast.   glucose blood test strip 1 each by Other route. Use as instructed   Lantus SoloStar 100 UNIT/ML Solostar Pen Generic drug: insulin glargine Inject 28 Units into the skin at bedtime.   linaclotide 145 MCG Caps capsule Commonly known as: LINZESS Take 145 mcg by mouth every other day.   metoprolol succinate 25 MG 24 hr tablet Commonly known as: TOPROL-XL 25 mg daily.   Mounjaro 7.5 MG/0.5ML Pen Generic drug: tirzepatide Inject into the skin.   Multi-Vitamin tablet Take 1 tablet by mouth daily.   omeprazole 40 MG  capsule Commonly known as: PRILOSEC Take 40 mg by mouth daily.   oxybutynin 5 MG 24 hr tablet Commonly known as: DITROPAN-XL Take 5 mg by mouth daily.   PEN NEEDLES 31GX5/16" 31G X 8 MM Misc Use as directed Use twice daily as directed.   pioglitazone 15 MG tablet Commonly known as: ACTOS Take 15 mg by mouth daily.   primidone 50 MG tablet Commonly known as: MYSOLINE Take by mouth 4 (four) times daily.   rizatriptan 10 MG tablet Commonly known as: MAXALT Take by mouth.   venlafaxine XR 75 MG 24 hr capsule Commonly known as: EFFEXOR-XR TAKE 1 CAPSULE EVERY DAY WITH BREAKFAST. TO BE COMBINED WITH 150MG  DAILY   venlafaxine XR 150 MG 24 hr capsule Commonly known as: EFFEXOR-XR Take 1 capsule (150 mg total) by mouth daily with breakfast. Take along with 75 mg daily        Allergies:  Allergies  Allergen Reactions   Nortriptyline Nausea And Vomiting   Pravastatin Other (See Comments)    Muscle pain   Topiramate Other (See Comments)   Metformin Nausea Only    Family History: Family History  Problem Relation Age of Onset   Bipolar disorder Sister    Bipolar disorder Brother    Breast cancer Neg Hx     Social History:  reports that she has never smoked. She has never used smokeless tobacco. She reports current alcohol use. She reports that she does not use drugs.  ROS:                                        Physical Exam: BP 127/79   Pulse 90   Ht 5\' 2"  (1.575 m)   Wt 103.8 kg   BMI 41.85 kg/m   Constitutional:  Alert and oriented, No acute distress. HEENT: [place] AT, moist mucus membranes.  Trachea midline, no masses. Cardiovascular: No clubbing, cyanosis, or edema. Respiratory: Normal respiratory effort, no increased work of breathing. GI: Abdomen is soft, nontender, nondistended, no abdominal masses GU: Pelvic examination was a little bit limited due to body habitus and relatively smaller speculum.  She appears to have a very well  supported bladder neck.  I did not see any mesh extrusion.  At the right urethrovesical angle I may have felt an abnormality but likely normal.  She has a high grade 1 cystocele with no hinging effect.  No stress incontinence with modest cough Skin: No rashes, bruises or suspicious lesions. Lymph: No cervical or inguinal adenopathy. Neurologic: Grossly intact, no focal deficits, moving all 4 extremities. Psychiatric: Normal mood and affect.  Laboratory Data: Lab Results  Component Value Date   WBC 16.0 (H) 04/25/2021  HGB 11.1 (L) 04/25/2021   HCT 32.7 (L) 04/25/2021   MCV 88.6 04/25/2021   PLT 278 04/25/2021    Lab Results  Component Value Date   CREATININE 0.65 04/25/2021    No results found for: "PSA"  No results found for: "TESTOSTERONE"  No results found for: "HGBA1C"  Urinalysis    Component Value Date/Time   COLORURINE YELLOW (A) 04/14/2021 0945   APPEARANCEUR HAZY (A) 04/14/2021 0945   LABSPEC 1.016 04/14/2021 0945   PHURINE 5.0 04/14/2021 0945   GLUCOSEU NEGATIVE 04/14/2021 0945   HGBUR NEGATIVE 04/14/2021 0945   BILIRUBINUR NEGATIVE 04/14/2021 0945   KETONESUR NEGATIVE 04/14/2021 0945   PROTEINUR NEGATIVE 04/14/2021 0945   NITRITE NEGATIVE 04/14/2021 0945   LEUKOCYTESUR NEGATIVE 04/14/2021 0945    Pertinent Imaging: Urine reviewed and sent for culture.  Chart reviewed.  Assessment & Plan: Patient has mildly mixed incontinence.  She predominately has urge incontinence.  She has frequency and nocturia.  She has had a previous sling.  Call if urine culture positive.  I will have her come back on Gemtesa samples and prescription for pelvic examination and cystoscopy.  I will reexamine the vaginal vault for any mesh and reassess for stress incontinence.  She understands we may need a test in the future without details today  1. Urge incontinence of urine (Primary)  - Urinalysis, Complete   No follow-ups on file.  Devorah Fonder, MD  [facility]  [facility] [address], [address] [place], [place] [address] [phone]

## 2023-08-09 NOTE — Patient Instructions (Signed)

## 2023-08-12 LAB — CULTURE, URINE COMPREHENSIVE

## 2023-08-12 NOTE — Procedures (Signed)
 [facility]  Polysomnogram Report Part I                                                               Phone: [phone] Fax: [phone]  Patient Name: Judith [name], Judith [name]. Acquisition Number: [id number]  Date of Birth: Mar 30, 1951 Acquisition Date: 07/29/2023  Referring Physician: Marisue Ivan, MD     History: The patient is a 73 year old  who was referred for re-evaluation of obstructive sleep apnea. Medical History: anginal pain, arthritis, asthma, depression, diabetes mellitus without complication, dysrhythmia, fibromyalgia, gastroparesis, GERD, headaches, sleep apnea.  Medications: EMGALITY, MOUNJARO, albuterol, aripiprazole, azelastine, cetirizine, continuous glucose receiver / sensor, glipizide, insulin Pen Needle, Lantus Solostar, linaclotide, metoprolol succinate, multiple vitamin, omeprazole, oxybutynin, pioglitazone, primidone, rizatriptan, venlafaxine XR.  Procedure: This routine overnight polysomnogram was performed on the [facility] 5 using the standard diagnostic protocol. This included 6 channels of EEG, 2 channels of EOG, chin EMG, bilateral anterior tibialis EMG, nasal/oral thermistor, PTAF (nasal pressure transducer), chest and abdominal wall movements, EKG, and pulse oximetry.  Description: The total recording time was 451.0 minutes. The total sleep time was 298.5 minutes. There were a total of 90.5 minutes of wakefulness after sleep onset for areducedsleep efficiency of 66.2%. The latency to sleep onset was prolonged at 62.0 minutes. The R sleep onset latency was N/A.  Sleep parameters, as a percentage of the total sleep time, demonstrated 19.9% of sleep was in N1 sleep, 80.1% N2, 0.0% N3 and 0.0% R sleep. There were a total of 61 arousals for an arousal index of 12.3 arousals per hour of sleep that was within normal limits.  Respiratory monitoring demonstrated   snoring . There were 132 apneas and hypopneas for an Apnea Hypopnea Index of 26.5 apneas and hypopneas per  hour of sleep.   The average duration of the respiratory events was 12.9 seconds with a maximum duration of 20.0 seconds. The respiratory events occurred . The respiratory events were associated with peripheral oxygen desaturations on the average to 92%. The lowest oxygen desaturation associated with a respiratory event was 88%. Additionally, the baseline oxygen saturation during wakefulness was 96%, during NREM sleep averaged 95%, and during REM sleep averaged N/A. The total duration of oxygen < 90% was 0.1 minutes.  Cardiac monitoring-  demonstrate transient cardiac decelerations associated with the apneas.  significant cardiac rhythm irregularities.   Periodic limb movement monitoring- demonstrated that there were 212 periodic limb movements for a periodic limb movement index of 42.6 periodic limb movements per hour of sleep.  Quasi-periodic limb movements were observed during some periods of wakefulness.  Impression: This routine overnight polysomnogram confirmed the presence of significant obstructive sleep apnea with an overall Apnea Hypopnea Index of 26.5 apneas and hypopneas per hour of sleep. As REM sleep was not observed, the findings likely underestimate the severity of the sleep apnea. The lowest desaturation was to 88%.    There was a significantly elevated periodic limb movement index of 42.6 periodic limb movements per hour of sleep. In addition, quasi-periodic limb movements were observed during some periods of wakefulness. Sometimes these limb movements subside once the apnea is controlled. Clinical correlation is suggested.  reduced sleep efficiency withincreased awakeningsand no REM or slow wave sleep. These findings would appear to be due  to the combination of obstructive sleep apnea and periodic limb movements.   Recommendations:    A CPAP titration would be recommended due to the severity of the sleep apnea. Additionally, would recommend weight loss in a patient with a BMI of  41.1.     Yevonne Pax, MD, [facility] Diplomate ABMS-Pulmonary, Critical Care and Sleep Medicine  Electronically reviewed and digitally signed  [facility] Polysomnogram Report Part II  Phone: [phone] Fax: [phone]  Patient last name [name]   16 in. Acquisition [phone]  Patient first name Judith [name]. Weight 225.0 lbs. Started 07/29/2023 at 9:58:46 PM  Birth date Mar 05, 1951 Height 62.0 in. Stopped 07/30/2023 at 5:40:22 AM  Age 41 BMI 41.1 lb/in2 Duration 451.0  Study Type Adult      Robbi Garter. RPSGT.// Loura Back   Reviewed by: Valentino Hue. Henke, PhD, ABSM, FAASM Sleep Data: Lights Out: 10:05:46 PM Sleep Onset: 11:07:46 PM  Lights On: 5:36:46 AM Sleep Efficiency: 66.2 %  Total Recording Time: 451.0 min Sleep Latency (from Lights Off) 62.0 min  Total Sleep Time (TST): 298.5 min R Latency (from Sleep Onset): N/A  Sleep Period Time: 389.0 min Total number of awakenings: 18  Wake during sleep: 90.5 min Wake After Sleep Onset (WASO): 90.5 min   Sleep Data:         Arousal Summary: Stage  Latency from lights out (min) Latency from sleep onset (min) Duration (min) % Total Sleep Time  Normal values  N 1 62.0 0.0 59.5 19.9 (5%)  N 2 63.0 1.0 239.0 80.1 (50%)  N 3       0.0 0.0 (20%)  R N/A N/A 0.0 0.0 (25%)   Number Index  Spontaneous 31 6.2  Apneas & Hypopneas 14 2.8  RERAs 0 0.0       (Apneas & Hypopneas & RERAs)  (14) (2.8)  Limb Movement 16 3.2  Snore 0 0.0  TOTAL 61 12.3     Respiratory Data:  CA OA MA Apnea Hypopnea* A+ H RERA Total  Number 0 44 0 44 88 132 0 132  Mean Dur (sec) 0.0 12.5 0.0 12.5 13.1 12.9 0.0 12.9  Max Dur (sec) 0.0 18.0 0.0 18.0 20.0 20.0 0.0 20.0  Total Dur (min) 0.0 9.2 0.0 9.2 19.2 28.4 0.0 28.4  % of TST 0.0 3.1 0.0 3.1 6.4 9.5 0.0 9.5  Index (#/h TST) 0.0 8.8 0.0 8.8 17.7 26.5 0.0 26.5  *Hypopneas scored based on 4% or greater desaturation.  Sleep Stage:        REM NREM TST  AHI N/A 26.5 26.5  RDI N/A 26.5  26.5           Body Position Data:  Sleep (min) TST (%) REM (min) NREM (min) CA (#) OA (#) MA (#) HYP (#) AHI (#/h) RERA (#) RDI (#/h) Desat (#)  Supine 253.0 84.76 0.0 253.0 0 23 0 83 25.1 0 25.1 107  Non-Supine 45.50 15.24 0.00 45.50 0.00 21.00 0.00 5.00 34.29 0 34.29 23.00  Left: 34.5 11.56 0.0 34.5 0 21 0 5 45.2 0 45.2 23  Right: 11.0 3.69 0.0 11.0 0 0 0 0 0.0 0 0.00 0     Snoring: Total number of snoring episodes  0  Total time with snoring    min (   % of sleep)   Oximetry Distribution:             WK REM NREM TOTAL  Average (%)  96    95 95  < 90% 0.0 0.0 0.1 0.1  < 80% 0.0 0.0 0.0 0.0  < 70% 0.0 0.0 0.0 0.0  # of Desaturations* 0 0 130 130  Desat Index (#/hour) 0.0    26.1 26.1  Desat Max (%) 0 0 9 9  Desat Max Dur (sec) 0.0 0.0 95.0 95.0  Approx Min O2 during sleep 88  Approx min O2 during a respiratory event 88  Was Oxygen added (Y/N) and final rate :    LPM  *Desaturations based on 4% or greater drop from baseline.   Cheyne Stokes Breathing: None Present   Heart Rate Summary:  Average Heart Rate During Sleep 81.6 bpm      Highest Heart Rate During Sleep (95th %) 88.0 bpm      Highest Heart Rate During Sleep 172 bpm (artifact)  Highest Heart Rate During Recording (TIB) 225 bpm (artifact)   Heart Rate Observations: Event Type # Events   Bradycardia 0 Lowest HR Scored: N/A  Sinus Tachycardia During Sleep 0 Highest HR Scored: N/A  Narrow Complex Tachycardia 0 Highest HR Scored: N/A  Wide Complex Tachycardia 0 Highest HR Scored: N/A  Asystole 0 Longest Pause: N/A  Atrial Fibrillation 0 Duration Longest Event: N/A  Other Arrythmias   Type:    Periodic Limb Movement Data: (Primary legs unless otherwise noted) Total # Limb Movement 224 Limb Movement Index 45.0  Total # PLMS 212 PLMS Index 42.6  Total # PLMS Arousals 15 PLMS Arousal Index 3.0  Percentage Sleep Time with PLMS 108.41min (36.3 % sleep)  Mean Duration limb movements (secs) 250.2

## 2023-08-14 DIAGNOSIS — G4733 Obstructive sleep apnea (adult) (pediatric): Secondary | ICD-10-CM | POA: Diagnosis not present

## 2023-08-16 ENCOUNTER — Ambulatory Visit: Payer: Medicare PPO | Admitting: Psychiatry

## 2023-08-17 ENCOUNTER — Ambulatory Visit: Payer: Medicare PPO | Admitting: Psychiatry

## 2023-08-18 ENCOUNTER — Encounter: Payer: Self-pay | Admitting: Psychiatry

## 2023-08-18 ENCOUNTER — Ambulatory Visit (INDEPENDENT_AMBULATORY_CARE_PROVIDER_SITE_OTHER): Payer: Self-pay | Admitting: Psychiatry

## 2023-08-18 VITALS — BP 124/86 | HR 110 | Temp 97.6°F | Ht 62.0 in | Wt 229.8 lb

## 2023-08-18 DIAGNOSIS — G4701 Insomnia due to medical condition: Secondary | ICD-10-CM

## 2023-08-18 DIAGNOSIS — F251 Schizoaffective disorder, depressive type: Secondary | ICD-10-CM

## 2023-08-18 DIAGNOSIS — F4381 Prolonged grief disorder: Secondary | ICD-10-CM

## 2023-08-18 NOTE — Progress Notes (Signed)
 [facility] MD OP Progress Note  08/18/2023 1:28 PM Judith [name]  MRN:  [id number]  Chief Complaint:  Chief Complaint  Patient presents with   Follow-up   Schizoaffective disorder   Medication Refill   Insomnia   Discussed the use of AI scribe software for clinical note transcription with the patient, who gave verbal consent to proceed.  History of Present Illness Judith Scruggs "Judith [name]" is a 73 year old Caucasian female, widowed, lives in [place], currently on SSI, has a history of schizoaffective disorder, obstructive sleep apnea on CPAP, diabetes mellitus, hyperlipidemia, fibromyalgia, history of multiple surgeries was evaluated for a psychiatric follow-up.  She experiences fatigue and lack of energy, which she attributes to her sleep apnea. She uses a CPAP machine, but it is no longer effective. An initial sleep study has been completed, and she is scheduled for a titration study next week. She hopes this will alleviate her tiredness. No excessive daytime sleepiness unless sitting and watching TV. Sleeps well with CPAP but wakes to urinate at night.  She experiences shortness of breath and chest pain that radiates into her arms upon exertion. No chest pain at rest or heart palpitations. She has an appointment with primary care provider tomorrow to investigate these symptoms further.  Her diet is managed by her sister, who cooks meals that include oatmeal or parfaits for breakfast and a variety of vegetables, baked or roasted meats, rice, and potatoes for dinner. She drinks enough water and reports no issues with her appetite.  She had a recent incident where her sister's dog caused her to fall, resulting in a strained and bruised foot, but no fractures were found. The foot remains sore.  She reports no symptoms of depression or anxiety and describes a recent enjoyable visit with her son, which included activities like going to breakfast and visiting a dog park.  She denies any suicidality,  homicidality or perceptual disturbances.      Visit Diagnosis:    ICD-10-CM   1. 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 Insomnia due to medical condition  G47.01    OSA on CPAP, pain      Past Psychiatric History: I have reviewed past psychiatric history from progress note on 01/29/2020.  Past Medical History:  Past Medical History:  Diagnosis Date   Anginal pain (HCC)    Arthritis    Asthma    Depression    Diabetes mellitus without complication (HCC)    Dysrhythmia    Fibromyalgia    Gastroparesis    GERD (gastroesophageal reflux disease)    Headache    Sleep apnea     Past Surgical History:  Procedure Laterality Date   ABDOMINAL HYSTERECTOMY     BLADDER REPAIR     CATARACT EXTRACTION Bilateral 02/08/2013   CHOLECYSTECTOMY     COLONOSCOPY WITH PROPOFOL  N/A 02/17/2023   Procedure: COLONOSCOPY WITH PROPOFOL ;  Surgeon: Toledo, Alphonsus Jeans, MD;  Location: [facility] ENDOSCOPY;  Service: Gastroenterology;  Laterality: N/A;   ESOPHAGOGASTRODUODENOSCOPY (EGD) WITH PROPOFOL  N/A 04/01/2016   Procedure: ESOPHAGOGASTRODUODENOSCOPY (EGD) WITH PROPOFOL ;  Surgeon: Cassie Click, MD;  Location: [facility] ENDOSCOPY;  Service: Endoscopy;  Laterality: N/A;   rotator cuff surgery Right    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  Family History:  Family History  Problem Relation Age of Onset   Bipolar disorder Sister    Bipolar disorder Brother    Breast cancer Neg Hx     Social History: I have reviewed social history from progress note on 01/29/2020. 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 Strain: Low Risk  (07/02/2023)   Received from [facility]   Overall Financial Resource Strain (CARDIA)    Difficulty of Paying Living Expenses: Not hard at all  Food Insecurity: No Food Insecurity (07/02/2023)   [facility]   Hunger Vital Sign    Worried About Running Out of Food in the Last Year: Never true    Ran Out of Food in the Last Year: Never true  Transportation Needs: No Transportation Needs (07/02/2023)   Received from [facility] - Transportation    In the past 12 months, has lack of transportation kept you from medical appointments or from getting medications?: No    Lack of Transportation (Non-Medical): No  Physical Activity: Not on file  Stress: Not on file  Social Connections: Not on file    Allergies:  Allergies  Allergen Reactions   Nortriptyline Nausea And Vomiting   Pravastatin Other (See Comments)    Muscle pain   Topiramate Other (See Comments)   Metformin Nausea Only    Metabolic Disorder Labs: No results found for: "HGBA1C", "MPG" Lab Results  Component Value Date   PROLACTIN 10.4 03/04/2021   No results found for: "CHOL", "TRIG", "HDL", "CHOLHDL", "VLDL", "LDLCALC" No results found for: "TSH"  Therapeutic Level Labs: No results found for: "LITHIUM" No results found for: "VALPROATE" No results found for: "CBMZ"  Current Medications: Current Outpatient Medications  Medication Sig Dispense Refill    albuterol  (PROVENTIL  HFA;VENTOLIN  HFA) 108 (90 Base) MCG/ACT inhaler Inhale 2 puffs into the lungs every 6 (six) hours as needed for wheezing or shortness of breath.     ARIPiprazole  (ABILIFY ) 2 MG tablet TAKE 1 TABLET(2 MG) BY MOUTH DAILY 90 tablet 3   azelastine  (ASTELIN ) 0.1 % nasal spray Place 1 spray into both nostrils daily as needed for allergies.     cetirizine (ZYRTEC) 10 MG tablet Take 10 mg by mouth daily.     Continuous Glucose Receiver (DEXCOM G7 RECEIVER) DEVI Use 1 each once for 1 dose     Continuous Glucose Sensor (DEXCOM G7 SENSOR) MISC      EMGALITY 120 MG/ML SOAJ Inject into the skin.     glipiZIDE  (GLUCOTROL  XL) 10 MG 24 hr tablet Take 20 mg by mouth daily with breakfast.     glucose blood test strip 1 each by Other route. Use  as instructed     Insulin  Pen Needle (PEN NEEDLES 31GX5/16") 31G X 8 MM MISC Use as directed Use twice daily as directed.     LANTUS  SOLOSTAR 100 UNIT/ML Solostar Pen Inject 28 Units into the skin at bedtime.     metoprolol  succinate (TOPROL -XL) 25 MG 24 hr tablet 25 mg daily.     MOUNJARO 7.5 MG/0.5ML Pen Inject into the skin.     Multiple Vitamin (MULTI-VITAMIN) tablet Take 1 tablet by mouth daily.     omeprazole (PRILOSEC) 40 MG capsule Take 40 mg by mouth daily.     pioglitazone  (ACTOS ) 15 MG tablet Take 15 mg by mouth daily.     primidone (MYSOLINE) 50 MG tablet Take by mouth 4 (four) times daily.     venlafaxine  XR (EFFEXOR -XR) 150 MG 24 hr capsule Take 1 capsule (150 mg total) by mouth daily with breakfast. Take along with 75 mg daily 90 capsule 3   venlafaxine  XR (EFFEXOR -XR) 75 MG 24 hr capsule TAKE 1 CAPSULE EVERY DAY WITH BREAKFAST. TO BE COMBINED WITH 150MG  DAILY 90 capsule 3   Vibegron  (GEMTESA ) 75 MG TABS Take 1 tablet (75 mg total) by mouth daily for 28 days.     ezetimibe (ZETIA) 10 MG tablet Take 1 tablet by mouth daily.     linaclotide (LINZESS) 145 MCG CAPS capsule Take 145 mcg by mouth every other day.     rizatriptan (MAXALT) 10  MG tablet Take by mouth.     No current facility-administered medications for this visit.     Musculoskeletal: Strength & Muscle Tone: within normal limits Gait & Station: normal Patient leans: N/A  Psychiatric Specialty Exam: Review of Systems  Psychiatric/Behavioral: Negative.      Blood pressure 124/86, pulse (!) 110, temperature 97.6 F (36.4 C), temperature source Temporal, height 5\' 2"  (1.575 m), weight 229 lb 12.8 oz (104.2 kg), SpO2 98%.Body mass index is 42.03 kg/m.  General Appearance: Casual  Eye Contact:  Fair  Speech:  Clear and Coherent  Volume:  Normal  Mood:  Euthymic  Affect:  Congruent  Thought Process:  Goal Directed and Descriptions of Associations: Intact  Orientation:  Full (Time, Place, and Person)  Thought Content: Logical   Suicidal Thoughts:  No  Homicidal Thoughts:  No  Memory:  Immediate;   Fair Recent;   Fair Remote;   Fair  Judgement:  Fair  Insight:  Fair  Psychomotor Activity:  Normal  Concentration:  Concentration: Fair and Attention Span: Fair  Recall:  [employer] of Knowledge: Fair  Language: Fair  Akathisia:  No  Handed:  Right  AIMS (if indicated): done  Assets:  Communication Skills Desire for Improvement Housing Transportation  ADL's:  Intact  Cognition: WNL  Sleep:  Fair   Screenings: Midwife Visit from 08/18/2023 in [facility] Office Visit from 02/15/2023 in [facility] Office Visit from 06/04/2022 in [facility] Office Visit from 03/04/2022 in [facility] Office Visit from 12/15/2021 in [facility]  AIMS Total Score 0 0 0 0 0      GAD-7    Flowsheet Row Office Visit from 02/15/2023 in [facility] Office Visit from 09/15/2022 in [facility] Office Visit from 06/04/2022 in [facility] Counselor from 03/05/2022 in [facility]  [facility]al Health at [facility] Visit from 03/04/2022 in [facility]  Total GAD-7 Score 2 2 0 3 1      PHQ2-9    Flowsheet Row Office Visit from 02/15/2023 in [facility] Office Visit from 09/15/2022 in [facility] Office Visit from 06/04/2022 in [facility] Counselor from 06/03/2022 in [facility] Behavioral [facility] at [facility] from 03/05/2022 in [facility] at [facility] Total Score 1 0 2 2 3   PHQ-9 Total Score -- 2 2 6 9       Flowsheet Row Office Visit from 08/18/2023 in [facility] Admission (Discharged) from 02/17/2023 in [facility] Office Visit from 02/15/2023 in [facility]  C-SSRS RISK CATEGORY Moderate Risk No Risk Moderate Risk        Assessment and Plan: Malky Rudzinski is a 73 year old Caucasian female, widowed, lives in [place], has a history of schizoaffective disorder, left knee total replacement surgery, presented for follow-up appointment, discussed assessment and plan as noted below.  Assessment & Plan Insomnia-improving Ongoing symptoms of tiredness due to obstructive sleep apnea. Current CPAP machine is non-functional, awaiting new CPAP titration study for pressure settings. - Complete CPAP titration study next week to determine appropriate pressure settings.  Schizoaffective disorder-stable Currently denies any significant depression symptoms or hallucinations.  Currently well-managed on medications. - Continue Abilify  2 mg daily - Continue Effexor  extended release 225 mg  daily  Bereavement-stable Continue CBT as needed  Reviewed and discussed labs dated 05/05/2023-CMP-glucose elevated otherwise within normal limits Lipid panel-within normal limits 04/05/2023-hemoglobin A1c-6.7.  Patient to continue follow-up with primary care provider.  Follow-up - Follow-up in clinic in 5 months or sooner if needed.    Collaboration of Care: Collaboration of Care: Primary Care Provider AEB encouraged to follow up with primary care provider for fatigue, shortness of breath on exertion  Patient/Guardian was advised Release of Information must be obtained prior to any record release in order to collaborate their care with an outside provider. Patient/Guardian was advised if they have not already done so to contact the registration department to sign all necessary forms in order for us  to release information regarding their care.   Consent: Patient/Guardian gives verbal consent for treatment and assignment of benefits for services provided during this visit. Patient/Guardian expressed understanding and agreed to proceed.  This note was generated in part or whole with voice recognition software. Voice recognition is usually quite accurate but there are transcription errors that can and very often do occur. I apologize for any typographical errors that were not detected and corrected.     Latrelle Bazar, MD 08/19/2023, 9:39 AM

## 2023-08-19 ENCOUNTER — Other Ambulatory Visit: Payer: Self-pay | Admitting: Nurse Practitioner

## 2023-08-19 DIAGNOSIS — R079 Chest pain, unspecified: Secondary | ICD-10-CM | POA: Diagnosis not present

## 2023-08-19 DIAGNOSIS — Z8249 Family history of ischemic heart disease and other diseases of the circulatory system: Secondary | ICD-10-CM

## 2023-08-19 DIAGNOSIS — Z789 Other specified health status: Secondary | ICD-10-CM | POA: Diagnosis not present

## 2023-08-19 DIAGNOSIS — R000 Tachycardia, unspecified: Secondary | ICD-10-CM | POA: Diagnosis not present

## 2023-08-19 DIAGNOSIS — E1169 Type 2 diabetes mellitus with other specified complication: Secondary | ICD-10-CM

## 2023-08-19 DIAGNOSIS — R5381 Other malaise: Secondary | ICD-10-CM | POA: Diagnosis not present

## 2023-08-19 DIAGNOSIS — R0602 Shortness of breath: Secondary | ICD-10-CM | POA: Diagnosis not present

## 2023-08-19 DIAGNOSIS — R002 Palpitations: Secondary | ICD-10-CM | POA: Diagnosis not present

## 2023-08-19 DIAGNOSIS — G4733 Obstructive sleep apnea (adult) (pediatric): Secondary | ICD-10-CM | POA: Diagnosis not present

## 2023-08-19 DIAGNOSIS — E669 Obesity, unspecified: Secondary | ICD-10-CM

## 2023-08-20 ENCOUNTER — Other Ambulatory Visit: Payer: Self-pay | Admitting: Nurse Practitioner

## 2023-08-20 DIAGNOSIS — R0602 Shortness of breath: Secondary | ICD-10-CM

## 2023-08-20 DIAGNOSIS — E669 Obesity, unspecified: Secondary | ICD-10-CM

## 2023-08-20 DIAGNOSIS — R5381 Other malaise: Secondary | ICD-10-CM

## 2023-08-20 DIAGNOSIS — E785 Hyperlipidemia, unspecified: Secondary | ICD-10-CM

## 2023-08-20 DIAGNOSIS — Z789 Other specified health status: Secondary | ICD-10-CM

## 2023-08-20 DIAGNOSIS — Z8249 Family history of ischemic heart disease and other diseases of the circulatory system: Secondary | ICD-10-CM

## 2023-08-20 DIAGNOSIS — R079 Chest pain, unspecified: Secondary | ICD-10-CM

## 2023-08-20 DIAGNOSIS — E1169 Type 2 diabetes mellitus with other specified complication: Secondary | ICD-10-CM

## 2023-08-24 ENCOUNTER — Encounter (INDEPENDENT_AMBULATORY_CARE_PROVIDER_SITE_OTHER): Admitting: Internal Medicine

## 2023-08-24 DIAGNOSIS — G4733 Obstructive sleep apnea (adult) (pediatric): Secondary | ICD-10-CM

## 2023-08-26 DIAGNOSIS — R0602 Shortness of breath: Secondary | ICD-10-CM | POA: Diagnosis not present

## 2023-08-26 DIAGNOSIS — R0789 Other chest pain: Secondary | ICD-10-CM | POA: Diagnosis not present

## 2023-08-27 ENCOUNTER — Ambulatory Visit
Admission: RE | Admit: 2023-08-27 | Discharge: 2023-08-27 | Disposition: A | Discharge status: Home / Self Care | Payer: Self-pay | Source: Ambulatory Visit | Attending: Nurse Practitioner | Admitting: Nurse Practitioner

## 2023-08-27 DIAGNOSIS — E785 Hyperlipidemia, unspecified: Secondary | ICD-10-CM | POA: Insufficient documentation

## 2023-08-27 DIAGNOSIS — Z8249 Family history of ischemic heart disease and other diseases of the circulatory system: Secondary | ICD-10-CM

## 2023-08-27 DIAGNOSIS — R5381 Other malaise: Secondary | ICD-10-CM

## 2023-08-27 DIAGNOSIS — R0602 Shortness of breath: Secondary | ICD-10-CM

## 2023-08-27 DIAGNOSIS — E669 Obesity, unspecified: Secondary | ICD-10-CM | POA: Insufficient documentation

## 2023-08-27 DIAGNOSIS — Z789 Other specified health status: Secondary | ICD-10-CM

## 2023-08-27 DIAGNOSIS — E1169 Type 2 diabetes mellitus with other specified complication: Secondary | ICD-10-CM

## 2023-08-27 DIAGNOSIS — R079 Chest pain, unspecified: Secondary | ICD-10-CM

## 2023-08-29 ENCOUNTER — Encounter: Payer: Self-pay | Admitting: Urology

## 2023-08-30 ENCOUNTER — Other Ambulatory Visit: Payer: Self-pay | Admitting: *Deleted

## 2023-08-30 MED ORDER — GEMTESA 75 MG PO TABS: 75.0000 mg | ORAL_TABLET | Freq: Every day | ORAL | 11 refills | Status: AC

## 2023-09-06 ENCOUNTER — Ambulatory Visit
Admission: RE | Admit: 2023-09-06 | Discharge: 2023-09-06 | Disposition: A | Discharge status: Home / Self Care | Source: Ambulatory Visit | Attending: Nurse Practitioner | Admitting: Nurse Practitioner

## 2023-09-06 DIAGNOSIS — Z8249 Family history of ischemic heart disease and other diseases of the circulatory system: Secondary | ICD-10-CM | POA: Diagnosis not present

## 2023-09-06 DIAGNOSIS — E785 Hyperlipidemia, unspecified: Secondary | ICD-10-CM | POA: Diagnosis not present

## 2023-09-06 DIAGNOSIS — R5381 Other malaise: Secondary | ICD-10-CM | POA: Diagnosis not present

## 2023-09-06 DIAGNOSIS — R079 Chest pain, unspecified: Secondary | ICD-10-CM | POA: Insufficient documentation

## 2023-09-06 DIAGNOSIS — E1169 Type 2 diabetes mellitus with other specified complication: Secondary | ICD-10-CM | POA: Diagnosis not present

## 2023-09-06 DIAGNOSIS — R0602 Shortness of breath: Secondary | ICD-10-CM | POA: Diagnosis not present

## 2023-09-06 DIAGNOSIS — Z789 Other specified health status: Secondary | ICD-10-CM | POA: Insufficient documentation

## 2023-09-06 DIAGNOSIS — E669 Obesity, unspecified: Secondary | ICD-10-CM | POA: Diagnosis not present

## 2023-09-06 DIAGNOSIS — R0789 Other chest pain: Secondary | ICD-10-CM | POA: Diagnosis not present

## 2023-09-06 MED ORDER — TECHNETIUM TC 99M TETROFOSMIN IV KIT
30.5600 | PACK | Freq: Once | INTRAVENOUS | Status: AC | PRN
  Administered 2023-09-06: 30.56 via INTRAVENOUS

## 2023-09-06 MED ORDER — REGADENOSON 0.4 MG/5ML IV SOLN
0.4000 mg | Freq: Once | INTRAVENOUS | Status: AC
  Administered 2023-09-06: 0.4 mg via INTRAVENOUS

## 2023-09-06 MED ORDER — TECHNETIUM TC 99M TETROFOSMIN IV KIT
10.7400 | PACK | Freq: Once | INTRAVENOUS | Status: AC | PRN
  Administered 2023-09-06: 10.74 via INTRAVENOUS

## 2023-09-07 DIAGNOSIS — R251 Tremor, unspecified: Secondary | ICD-10-CM | POA: Diagnosis not present

## 2023-09-07 DIAGNOSIS — R413 Other amnesia: Secondary | ICD-10-CM | POA: Diagnosis not present

## 2023-09-07 DIAGNOSIS — R42 Dizziness and giddiness: Secondary | ICD-10-CM | POA: Diagnosis not present

## 2023-09-07 DIAGNOSIS — R519 Headache, unspecified: Secondary | ICD-10-CM | POA: Diagnosis not present

## 2023-09-09 LAB — NM MYOCAR MULTI W/SPECT W/WALL MOTION / EF
Base ST Depression (mm): 0 mm
Estimated workload: 1
Exercise duration (min): 1 min
Exercise duration (sec): 0 s
LV dias vol: 123 mL (ref 46–106)
LV sys vol: 76 mL
MPHR: 148 {beats}/min
Nuc Stress EF: 38 %
Peak HR: 101 {beats}/min
Percent HR: 68 %
Rest HR: 78 {beats}/min
Rest Nuclear Isotope Dose: 10.7 mCi
SDS: 7
SRS: 18
SSS: 21
ST Depression (mm): 0 mm
Stress Nuclear Isotope Dose: 30.6 mCi
TID: 0.93

## 2023-09-15 DIAGNOSIS — J45909 Unspecified asthma, uncomplicated: Secondary | ICD-10-CM | POA: Diagnosis not present

## 2023-09-15 DIAGNOSIS — E669 Obesity, unspecified: Secondary | ICD-10-CM | POA: Diagnosis not present

## 2023-09-15 DIAGNOSIS — E1169 Type 2 diabetes mellitus with other specified complication: Secondary | ICD-10-CM | POA: Diagnosis not present

## 2023-09-15 DIAGNOSIS — R000 Tachycardia, unspecified: Secondary | ICD-10-CM | POA: Diagnosis not present

## 2023-09-15 DIAGNOSIS — R0609 Other forms of dyspnea: Secondary | ICD-10-CM | POA: Diagnosis not present

## 2023-09-15 DIAGNOSIS — R002 Palpitations: Secondary | ICD-10-CM | POA: Diagnosis not present

## 2023-09-15 DIAGNOSIS — G4733 Obstructive sleep apnea (adult) (pediatric): Secondary | ICD-10-CM | POA: Diagnosis not present

## 2023-09-15 DIAGNOSIS — Z789 Other specified health status: Secondary | ICD-10-CM | POA: Diagnosis not present

## 2023-09-17 NOTE — Procedures (Signed)
 [facility]  Polysomnogram Report Part I  Phone: [phone] Fax: [phone]  Patient Name: Judith [name], Judith [name]. Acquisition Number: [id number]  Date of Birth: April 17, 1951 Acquisition Date: 08/24/2023  Referring Physician: Monique Ano, MD     History: The patient is a 73 year old  . Medical History: anginal pain, arthritis, asthma, depression, diabetes mellitus without complication, dysrhythmia, fibromyalgia, gastroparesis, GERD, headaches and OSA.  Medications: emgality, mounjaro, albuterol , aripiprazole , azelastine , cetirizine, continuous glucose receiver/sensor, glipizide , insulin  pen needle, Lantus  Solostar, metoprolol  succinate, multiple vitamin, omeprazole, oxybutymin, ploglitazone, primidone, rizatriptan, venlafaxine  XR.  Procedure: This routine overnight polysomnogram was performed on the Alice 5 using the standard CPAP protocol. This included 6 channels of EEG, 2 channels of EOG, chin EMG, bilateral anterior tibialis EMG, nasal/oral thermistor, PTAF (nasal pressure transducer), chest and abdominal wall movements, EKG, and pulse oximetry.  Description: The total recording time was 370.4 minutes. The total sleep time was 274.5 minutes. There were a total of 67.3 minutes of wakefulness after sleep onset for a reducedsleep efficiency of 74.1%. The latency to sleep onset was within normal limitsat 28.6 minutes. The R sleep onset latency was N/A.  Sleep parameters, as a percentage of the total sleep time, demonstrated 38.3% of sleep was in N1 sleep, 61.7% N2, 0.0% N3 and 0.0% R sleep. There were a total of 346 arousals for an arousal index of 75.6 arousals per hour of sleep that was elevated.  Overall, there were a total of 11 respiratory events for a respiratory disturbance index, which includes apneas, hypopneas and RERAs (increased respiratory effort) of 2.4 respiratory events per hour of sleep during the pressure titration.  was initiated at 4 cm H2O at lights out, 12:08 a.m.  The apnea was controlled at this pressure in the lateral position. The patient was asked to move to the supine position and the pressure was further titrated in 1-2 cm increments to the final pressure of 11 cm H2O. The apnea was controlled at this pressure, however REM sleep was not observed.  Additionally, the baseline oxygen saturation during wakefulness was 97%, during NREM sleep averaged 96%, and during REM sleep averaged N/A.The total duration of oxygen < 90% was 0.0 minutes.  Cardiac monitoring-  significant cardiac rhythm irregularities.   Periodic limb movement monitoring- demonstrated that there were 338 periodic limb movements for a periodic limb movement index of 73.9 periodic limb movements per hour of sleep. Quasi-periodic limb movements were observed during periods of wakefulness.  Impression: This patient's obstructive sleep apnea demonstrated significant improvement with the utilization of nasal  at 4 cm H2O in the lateral position and 11 cm H2O in the supine position. REM sleep was not observed.   There was a highly elevated periodic limb movement index of 73.9 periodic limb movements per hour of sleep. In addition, quasi-periodic limb movements were observed during periods of wakefulness. These limb movements were also observed in the prior PSG. Treatment may be indicated if sleep disruption or sleepiness persist once the patient is fully compliant with CPAP.  Recommendations: Would recommend utilization of nasal  at 4-14 cm H2O.  A 30 day download is recommended to ensure that the apnea is controlled. A small ResMed Quattro Mirage Full Face mask was used. Chin strap used during study- no. Humidifier used during study- yes.     Cordie Deters, MD, [facility] Diplomate ABMS-Pulmonary, Critical Care and Sleep Medicine  Electronically reviewed and digitally signed  [facility] CPAP/BIPAP Polysomnogram Report Part II Phone: [phone]  Fax: [phone]  Patient  last name Perdomo Neck Size    in. Acquisition [id number]  Patient first name Judith [name] Weight 225.0 lbs. Started 08/24/2023 at 11:59:54 PM  Birth date Jan 14, 1951 Height 62.0 in. Stopped 08/25/2023 at 7:32:24 AM  Age 73      Type Adult BMI 41.1 lb/in2 Duration 370.4  Report generated by Jackalyn Martyr, RPSGT    Reviewed by: Delmus Ferri. Henke, PhD, ABSM, FAASM Sleep Data: Lights Out: 12:08:48 AM Sleep Onset: 12:37:24 AM  Lights On: 6:19:12 AM Sleep Efficiency: 74.1 %  Total Recording Time: 370.4 min Sleep Latency (from Lights Off) 28.6 min  Total Sleep Time (TST): 274.5 min R Latency (from Sleep Onset): N/A  Sleep Period Time: 341.5 min Total number of awakenings: 32  Wake during sleep: 67.0 min Wake After Sleep Onset (WASO): 67.3 min   Sleep Data:         Arousal Summary: Stage  Latency from lights out (min) Latency from sleep onset (min) Duration (min) % Total Sleep Time  Normal values  N 1 28.6 0.0 105.0 38.3 (5%)  N 2 30.6 2.0 169.5 61.7 (50%)  N 3       0.0 0.0 (20%)  R N/A N/A 0.0 0.0 (25%)   Number Index  Spontaneous 116 25.4  Apneas & Hypopneas 10 2.2  RERAs 0 0.0       (Apneas & Hypopneas & RERAs)  (10) (2.2)  Limb Movement 220 48.1  Snore 0 0.0  TOTAL 346 75.6     Respiratory Data:  CA OA MA Apnea Hypopnea* A+ H RERA Total  Number 0 1 0 1 10 11  0 11  Mean Dur (sec) 0.0 16.0 0.0 16.0 27.4 26.4 0.0 26.4  Max Dur (sec) 0.0 16.0 0.0 16.0 36.0 36.0 0.0 36.0  Total Dur (min) 0.0 0.3 0.0 0.3 4.6 4.8 0.0 4.8  % of TST 0.0 0.1 0.0 0.1 1.7 1.8 0.0 1.8  Index (#/h TST) 0.0 0.2 0.0 0.2 2.2 2.4 0.0 2.4  *Hypopneas scored based on 4% or greater desaturation.  Sleep Stage:         REM NREM TST  AHI N/A 2.4 2.4  RDI N/A 2.4 2.4   Sleep (min) TST (%) REM (min) NREM (min) CA (#) OA (#) MA (#) HYP (#) AHI (#/h) RERA (#) RDI (#/h) Desat (#)  Supine 124.5 45.36 0.0 124.5 0 1 0 9 4.8 0 4.8 16  Non-Supine 150.00 54.64 0.00 150.00 0.00 0.00 0.00 1.00 0.40 0 0.40 15.00  Left: 150.0  54.64 0.0 150.0 0 0 0 1 0.4 0 0.4 15     Snoring: Total number of snoring episodes  0  Total time with snoring    min (   % of sleep)   Oximetry Distribution:             WK REM NREM TOTAL  Average (%)   97    96 96  < 90% 0.0 0.0 0.0 0.0  < 80% 0.0 0.0 0.0 0.0  < 70% 0.0 0.0 0.0 0.0  # of Desaturations* 3 0 21 24  Desat Index (#/hour) 2.0    4.6 5.2  Desat Max (%) 5 0 6 6  Desat Max Dur (sec) 62.0 0.0 92.0 92.0  Approx Min O2 during sleep 91  Approx min O2 during a respiratory event 91  Was Oxygen added (Y/N) and final rate :    LPM  *Desaturations based on 3% or greater drop from baseline.  Cheyne Stokes Breathing: None Present   Heart Rate Summary:  Average Heart Rate During Sleep 70.9 bpm      Highest Heart Rate During Sleep (95th %) 137.0 bpm      Highest Heart Rate During Sleep 197 bpm (artifact)  Highest Heart Rate During Recording (TIB) 203 bpm (artifact)   Heart Rate Observations: Event Type # Events   Bradycardia 0 Lowest HR Scored: N/A  Sinus Tachycardia During Sleep 0 Highest HR Scored: N/A  Narrow Complex Tachycardia 0 Highest HR Scored: N/A  Wide Complex Tachycardia 0 Highest HR Scored: N/A  Asystole 0 Longest Pause: N/A  Atrial Fibrillation 0 Duration Longest Event: N/A  Other Arrythmias   Type:   Periodic Limb Movement Data: (Primary legs unless otherwise noted) Total # Limb Movement 372 Limb Movement Index 81.3  Total # PLMS 338 PLMS Index 73.9  Total # PLMS Arousals 196 PLMS Arousal Index 42.8  Percentage Sleep Time with PLMS 173.56min (63.2 % sleep)  Mean Duration limb movements (secs) 547.9    IPAP Level (cmH2O) EPAP Level (cmH2O) Total Duration (min) Sleep Duration (min) Sleep (%) REM (%) CA  #) OA # MA # HYP #) AHI (#/hr) RERAs # RERAs (#/hr) RDI (#/hr)  4 4 88.4 52.5 59.4 0.0 0 0 0 1 1.1 0 0.0 1.1  5 5  40.2 28.8 71.6 0.0 0 0 0 0 0.0 0 0.0 0.0  6 6 45.7 42.3 92.6 0.0 0 0 0 0 0.0 0 0.0 0.0  7 7 65.4 57.9 88.5 0.0 0 1 0 7 8.3 0 0.0 8.3  9 9   31.6 31.6 100.0 0.0 0 0 0 1 1.9 0 0.0 1.9  10 10  43.0 42.0 97.7 0.0 0 0 0 1 1.4 0 0.0 1.4  11 11  20.4 18.0 88.2 0.0 0 0 0 0 0.0 0 0.0 0.0

## 2023-09-22 ENCOUNTER — Other Ambulatory Visit: Payer: Self-pay | Admitting: Family Medicine

## 2023-09-22 DIAGNOSIS — Z1231 Encounter for screening mammogram for malignant neoplasm of breast: Secondary | ICD-10-CM

## 2023-09-30 DIAGNOSIS — E669 Obesity, unspecified: Secondary | ICD-10-CM | POA: Diagnosis not present

## 2023-09-30 DIAGNOSIS — E1169 Type 2 diabetes mellitus with other specified complication: Secondary | ICD-10-CM | POA: Diagnosis not present

## 2023-10-05 ENCOUNTER — Ambulatory Visit
Admission: RE | Admit: 2023-10-05 | Discharge: 2023-10-05 | Disposition: A | Discharge status: Home / Self Care | Source: Ambulatory Visit | Attending: Family Medicine | Admitting: Family Medicine

## 2023-10-05 DIAGNOSIS — Z1231 Encounter for screening mammogram for malignant neoplasm of breast: Secondary | ICD-10-CM | POA: Diagnosis not present

## 2023-10-07 DIAGNOSIS — Z794 Long term (current) use of insulin: Secondary | ICD-10-CM | POA: Diagnosis not present

## 2023-10-07 DIAGNOSIS — E1169 Type 2 diabetes mellitus with other specified complication: Secondary | ICD-10-CM | POA: Diagnosis not present

## 2023-10-07 DIAGNOSIS — E669 Obesity, unspecified: Secondary | ICD-10-CM | POA: Diagnosis not present

## 2023-10-07 DIAGNOSIS — N182 Chronic kidney disease, stage 2 (mild): Secondary | ICD-10-CM | POA: Diagnosis not present

## 2023-10-07 DIAGNOSIS — G4733 Obstructive sleep apnea (adult) (pediatric): Secondary | ICD-10-CM | POA: Diagnosis not present

## 2023-10-07 DIAGNOSIS — E1122 Type 2 diabetes mellitus with diabetic chronic kidney disease: Secondary | ICD-10-CM | POA: Diagnosis not present

## 2023-10-07 DIAGNOSIS — E66813 Obesity, class 3: Secondary | ICD-10-CM | POA: Diagnosis not present

## 2023-10-25 ENCOUNTER — Other Ambulatory Visit: Admitting: Urology

## 2023-10-27 DIAGNOSIS — G4733 Obstructive sleep apnea (adult) (pediatric): Secondary | ICD-10-CM | POA: Diagnosis not present

## 2023-10-27 DIAGNOSIS — R0609 Other forms of dyspnea: Secondary | ICD-10-CM | POA: Diagnosis not present

## 2023-10-27 DIAGNOSIS — E669 Obesity, unspecified: Secondary | ICD-10-CM | POA: Diagnosis not present

## 2023-10-27 DIAGNOSIS — R4689 Other symptoms and signs involving appearance and behavior: Secondary | ICD-10-CM | POA: Diagnosis not present

## 2023-11-03 DIAGNOSIS — E785 Hyperlipidemia, unspecified: Secondary | ICD-10-CM | POA: Diagnosis not present

## 2023-11-03 DIAGNOSIS — E1169 Type 2 diabetes mellitus with other specified complication: Secondary | ICD-10-CM | POA: Diagnosis not present

## 2023-11-06 DIAGNOSIS — G4733 Obstructive sleep apnea (adult) (pediatric): Secondary | ICD-10-CM | POA: Diagnosis not present

## 2023-11-10 DIAGNOSIS — E119 Type 2 diabetes mellitus without complications: Secondary | ICD-10-CM | POA: Diagnosis not present

## 2023-11-10 DIAGNOSIS — Z1331 Encounter for screening for depression: Secondary | ICD-10-CM | POA: Diagnosis not present

## 2023-11-10 DIAGNOSIS — Z0000 Encounter for general adult medical examination without abnormal findings: Secondary | ICD-10-CM | POA: Diagnosis not present

## 2023-11-22 DIAGNOSIS — K3184 Gastroparesis: Secondary | ICD-10-CM | POA: Diagnosis not present

## 2023-11-22 DIAGNOSIS — K5904 Chronic idiopathic constipation: Secondary | ICD-10-CM | POA: Diagnosis not present

## 2023-11-22 DIAGNOSIS — K219 Gastro-esophageal reflux disease without esophagitis: Secondary | ICD-10-CM | POA: Diagnosis not present

## 2023-11-22 DIAGNOSIS — E1143 Type 2 diabetes mellitus with diabetic autonomic (poly)neuropathy: Secondary | ICD-10-CM | POA: Diagnosis not present

## 2023-11-22 DIAGNOSIS — K30 Functional dyspepsia: Secondary | ICD-10-CM | POA: Diagnosis not present

## 2023-11-29 DIAGNOSIS — W010XXA Fall on same level from slipping, tripping and stumbling without subsequent striking against object, initial encounter: Secondary | ICD-10-CM | POA: Diagnosis not present

## 2023-11-29 DIAGNOSIS — M25561 Pain in right knee: Secondary | ICD-10-CM | POA: Diagnosis not present

## 2023-12-07 DIAGNOSIS — G4733 Obstructive sleep apnea (adult) (pediatric): Secondary | ICD-10-CM | POA: Diagnosis not present

## 2023-12-09 DIAGNOSIS — M1711 Unilateral primary osteoarthritis, right knee: Secondary | ICD-10-CM | POA: Diagnosis not present

## 2023-12-14 DIAGNOSIS — E1169 Type 2 diabetes mellitus with other specified complication: Secondary | ICD-10-CM | POA: Diagnosis not present

## 2023-12-14 DIAGNOSIS — E785 Hyperlipidemia, unspecified: Secondary | ICD-10-CM | POA: Diagnosis not present

## 2023-12-14 DIAGNOSIS — I2510 Atherosclerotic heart disease of native coronary artery without angina pectoris: Secondary | ICD-10-CM | POA: Diagnosis not present

## 2023-12-14 DIAGNOSIS — I700 Atherosclerosis of aorta: Secondary | ICD-10-CM | POA: Diagnosis not present

## 2023-12-14 DIAGNOSIS — G4733 Obstructive sleep apnea (adult) (pediatric): Secondary | ICD-10-CM | POA: Diagnosis not present

## 2023-12-14 DIAGNOSIS — Z789 Other specified health status: Secondary | ICD-10-CM | POA: Diagnosis not present

## 2023-12-14 DIAGNOSIS — R0609 Other forms of dyspnea: Secondary | ICD-10-CM | POA: Diagnosis not present

## 2023-12-14 DIAGNOSIS — E669 Obesity, unspecified: Secondary | ICD-10-CM | POA: Diagnosis not present

## 2023-12-20 DIAGNOSIS — M8588 Other specified disorders of bone density and structure, other site: Secondary | ICD-10-CM | POA: Diagnosis not present

## 2023-12-29 ENCOUNTER — Ambulatory Visit (INDEPENDENT_AMBULATORY_CARE_PROVIDER_SITE_OTHER): Admitting: Psychiatry

## 2023-12-29 ENCOUNTER — Encounter: Payer: Self-pay | Admitting: Psychiatry

## 2023-12-29 ENCOUNTER — Other Ambulatory Visit: Payer: Self-pay

## 2023-12-29 VITALS — BP 123/83 | HR 105 | Temp 96.5°F | Ht 62.0 in | Wt 221.8 lb

## 2023-12-29 DIAGNOSIS — F251 Schizoaffective disorder, depressive type: Secondary | ICD-10-CM | POA: Diagnosis not present

## 2023-12-29 DIAGNOSIS — G4701 Insomnia due to medical condition: Secondary | ICD-10-CM | POA: Diagnosis not present

## 2023-12-29 NOTE — Progress Notes (Unsigned)
 BH MD OP Progress Note  12/29/2023 1:22 PM Judith [name]  MRN:  [id number]  Chief Complaint:  Chief Complaint  Patient presents with   Follow-up   schioaffective disorder   Medication Refill   Insomnia   Discussed the use of AI scribe software for clinical note transcription with the patient, who gave verbal consent to proceed.  History of Present Illness Judith [name] is a 73 year old Caucasian female, widowed, lives in [place], currently on SSI, history of schizoaffective disorder, obstructive sleep apnea on CPAP, diabetes mellitus, hyperlipidemia, fibromyalgia, history of multiple surgeries was evaluated in office today for follow-up.  Over the past couple of months, she has experienced vivid, memorable dreams. She describes these dreams as not being nightmares and reports that they do not affect her sleep. She identifies the need to get up to void at night as the only factor impacting her sleep. She denies experiencing hallucinations or paranoia.  The patient reports occasional mild memory lapses, such as forgetting what she was about to do or say. She feels generally okay and, while she occasionally thinks about things that bother her, she moves past them quickly and does not dwell on them.  She continues to take Abilify  2 mg daily and Effexor  XR (venlafaxine  extended release) as previously prescribed. She also started Mounjaro around May and notes a recent dose increase, but does not believe this medication relates to her vivid dreams.  She denies any thoughts about hurting herself or others.  She lives with her sister, who cooks. She participates in household chores including preparing breakfast, loading the dishwasher, doing laundry, vacuuming, and sweeping the family room. She walks the dog and uses an inhaler prior to walks.  She has asthma. She is undergoing cystoscopy soon. She uses CPAP.    Visit Diagnosis:    ICD-10-CM   1. Schizoaffective disorder, depressive type  (HCC)  F25.1     2. Insomnia due to medical condition  G47.01    OSA on CPAP,pain      Past Psychiatric History: I have reviewed past psychiatric history from progress note on 01/29/2020.  Past Medical History:  Past Medical History:  Diagnosis Date   Anginal pain (HCC)    Arthritis    Asthma    Depression    Diabetes mellitus without complication (HCC)    Dysrhythmia    Fibromyalgia    Gastroparesis    GERD (gastroesophageal reflux disease)    Headache    Sleep apnea     Past Surgical History:  Procedure Laterality Date   ABDOMINAL HYSTERECTOMY     BLADDER REPAIR     CATARACT EXTRACTION Bilateral 02/08/2013   CHOLECYSTECTOMY     COLONOSCOPY WITH PROPOFOL  N/A 02/17/2023   Procedure: COLONOSCOPY WITH PROPOFOL ;  Surgeon: Toledo, Ladell POUR, MD;  Location: [facility] ENDOSCOPY;  Service: Gastroenterology;  Laterality: N/A;   ESOPHAGOGASTRODUODENOSCOPY (EGD) WITH PROPOFOL  N/A 04/01/2016   Procedure: ESOPHAGOGASTRODUODENOSCOPY (EGD) WITH PROPOFOL ;  Surgeon: Lamar ONEIDA Holmes, MD;  Location: 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  Family History:  Family History  Problem Relation Age of Onset   Bipolar disorder Sister    Bipolar disorder Brother  Breast cancer Neg Hx     Social History: I have reviewed social history from progress note on 01/29/2020. 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   Overall Financial Resource Strain (CARDIA)    Difficulty of Paying Living Expenses: Not hard at all  Food Insecurity: No Food Insecurity (10/27/2023)   [facility]   Hunger Vital Sign    Within the past 12 months, you worried that your food would run out before you got the money to buy more.: Never true    Within the past 12 months, the food you bought just didn't last and you didn't have money to get more.: Never true  Transportation Needs: No Transportation Needs (10/27/2023)   Received from [facility] - Transportation    In the past 12 months, has lack of transportation kept you from medical appointments or from getting medications?: No    Lack of Transportation (Non-Medical): No  Physical Activity: Not on file  Stress: Not on file  Social Connections: Not on file    Allergies:  Allergies  Allergen Reactions   Nortriptyline Nausea And Vomiting   Pravastatin Other (See Comments)    Muscle pain   Topiramate Other (See Comments)   Metformin Nausea Only    Metabolic Disorder Labs: No results found for: HGBA1C, MPG Lab Results  Component Value Date   PROLACTIN 10.4 03/04/2021   No results found for: CHOL, TRIG, HDL, CHOLHDL, VLDL, LDLCALC No results found for: TSH  Therapeutic Level Labs: No results found for: LITHIUM No results found for: VALPROATE No results found for: CBMZ  Current Medications: Current Outpatient Medications  Medication Sig Dispense Refill   albuterol  (PROVENTIL  HFA;VENTOLIN   HFA) 108 (90 Base) MCG/ACT inhaler Inhale 2 puffs into the lungs every 6 (six) hours as needed for wheezing or shortness of breath.     ARIPiprazole  (ABILIFY ) 2 MG tablet TAKE 1 TABLET(2 MG) BY MOUTH DAILY 90 tablet 3   azelastine  (ASTELIN ) 0.1 % nasal spray Place 1 spray into both nostrils daily as needed for allergies.     cetirizine (ZYRTEC) 10 MG tablet Take 10 mg by mouth daily.     Continuous Glucose Receiver (DEXCOM G7 RECEIVER) DEVI Use 1 each once for 1 dose     Continuous Glucose Sensor (DEXCOM G7 SENSOR) MISC      EMGALITY 120 MG/ML SOAJ Inject into the skin.     ezetimibe (ZETIA) 10 MG tablet Take 1 tablet by mouth daily.     glipiZIDE  (GLUCOTROL  XL) 10 MG 24 hr tablet Take 20 mg by mouth daily with breakfast.     glucose blood test strip 1 each by Other route. Use as  instructed     Insulin  Pen Needle (PEN NEEDLES 31GX5/16) 31G X 8 MM MISC Use as directed Use twice daily as directed.     LANTUS  SOLOSTAR 100 UNIT/ML Solostar Pen Inject 28 Units into the skin at bedtime.     linaclotide (LINZESS) 145 MCG CAPS capsule Take 145 mcg by mouth every other day.     metoprolol  succinate (TOPROL -XL) 25 MG 24 hr tablet 25 mg daily.     MOUNJARO 7.5 MG/0.5ML Pen Inject into the skin.     Multiple Vitamin (MULTI-VITAMIN) tablet Take 1 tablet by mouth daily.     omeprazole (PRILOSEC) 40 MG capsule Take 40 mg by mouth daily.     pioglitazone  (ACTOS ) 15 MG tablet Take 15 mg by mouth daily.     primidone (MYSOLINE) 50 MG tablet Take by mouth 4 (four) times daily.     rizatriptan (MAXALT) 10 MG tablet Take by mouth.     venlafaxine  XR (EFFEXOR -XR) 150 MG 24 hr capsule Take 1 capsule (150 mg total) by mouth daily with breakfast. Take along with 75 mg daily 90 capsule 3   venlafaxine  XR (EFFEXOR -XR) 75 MG 24 hr capsule TAKE 1 CAPSULE EVERY DAY WITH BREAKFAST. TO BE COMBINED WITH 150MG  DAILY 90 capsule 3   Vibegron  (GEMTESA ) 75 MG TABS Take 1 tablet (75 mg total) by mouth daily. 30 tablet 11   No  current facility-administered medications for this visit.     Musculoskeletal: Strength & Muscle Tone: within normal limits Gait & Station: normal Patient leans: N/A  Psychiatric Specialty Exam: Review of Systems  Psychiatric/Behavioral:  Positive for sleep disturbance (overactive bladder).     Blood pressure 123/83, pulse (!) 105, temperature (!) 96.5 F (35.8 C), temperature source Temporal, height 5' 2 (1.575 m), weight 221 lb 12.8 oz (100.6 kg).Body mass index is 40.57 kg/m.  General Appearance: Casual  Eye Contact:  Fair  Speech:  Normal Rate  Volume:  Normal  Mood:  Euthymic  Affect:  Congruent  Thought Process:  Goal Directed and Descriptions of Associations: Intact  Orientation:  Full (Time, Place, and Person)  Thought Content: Logical   Suicidal Thoughts:  No  Homicidal Thoughts:  No  Memory:  Immediate;   Fair Recent;   Fair Remote;   Fair  Judgement:  Fair  Insight:  Fair  Psychomotor Activity:  Normal  Concentration:  Concentration: Fair and Attention Span: Fair  Recall:  [employer] of Knowledge: Fair  Language: Fair  Akathisia:  No  Handed:  Right  AIMS (if indicated): done  Assets:  Communication Skills Desire for Improvement Housing Social Support  ADL's:  Intact  Cognition: WNL  Sleep:  restless due overactive bladder   Screenings: AIMS    Flowsheet Row Office Visit from 12/29/2023 in [facility] Office Visit from 08/18/2023 in [facility] Office Visit from 02/15/2023 in [facility] Office Visit from 06/04/2022 in [facility] Office Visit from 03/04/2022 in [facility]  AIMS Total Score 0 0 0 0 0   GAD-7    Flowsheet Row Office Visit from 02/15/2023 in [facility] Office Visit from 09/15/2022 in [facility] Office Visit from 06/04/2022 in [facility] Counselor from 03/05/2022 in [facility] Visit from 03/04/2022 in [facility]  Total GAD-7 Score 2 2 0 3 1   PHQ2-9    Flowsheet Row Office Visit from 12/29/2023 in [facility] Office Visit from 02/15/2023 in [facility] Office Visit from 09/15/2022 in [facility] Office Visit from 06/04/2022 in [facility] Counselor from 06/03/2022 in [facility] Total Score 0 1 0 2 2  PHQ-9 Total Score -- -- 2 2 6    Flowsheet Row Office Visit from 12/29/2023 in [facility] Office Visit from 08/18/2023 in [facility] Admission (Discharged) from 02/17/2023 in [facility]  C-SSRS RISK CATEGORY Moderate Risk Moderate Risk No Risk     Assessment and Plan: Kjerstin Abrigo is a 73 year old Caucasian female, widowed, lives in [place], has a history of schizoaffective disorder, left knee total replacement surgery was evaluated in office today.  Discussed assessment and plan as noted below.  Schizoaffective disorder-stable Currently denies any significant concerns. Continue Abilify  2 mg daily Continue Effexor  extended release 225 mg daily  Insomnia-improving Does have ongoing sleep problems due to overactive bladder.  Currently scheduled for cystoscopy and has upcoming appointment with urology. Continues to be compliant with CPAP for obstructive sleep apnea. Encouraged to follow up with urology. Continue CPAP for sleep apnea.  Reviewed and discussed most recent labs including lipid panel  dated 11/03/2023-within normal limits, hemoglobin A1c-elevated 5.9-6 /08/2023, sodium dated 09/30/2023-138.  Follow-up Follow-up will clinic in 5 months or sooner if needed.   Collaboration of Care: Collaboration of Care: Other Encouraged to follow up with urology  Patient/Guardian was advised Release of Information must be obtained prior to any record release in order to collaborate their care with an outside provider. Patient/Guardian was advised if they have not already done so to contact the registration department to sign all necessary forms in order for us  to release information regarding their care.   Consent: Patient/Guardian gives verbal consent for treatment and assignment of benefits for services provided during this visit. Patient/Guardian expressed understanding and agreed to proceed.   This note was generated in part or whole with voice recognition software. Voice recognition is usually quite accurate but there are transcription errors that can and very often do occur. I apologize for any typographical errors that were not detected and corrected.    Jedi Catalfamo, MD 12/30/2023, 12:52 PM

## 2023-12-30 DIAGNOSIS — Z794 Long term (current) use of insulin: Secondary | ICD-10-CM | POA: Diagnosis not present

## 2023-12-30 DIAGNOSIS — G4733 Obstructive sleep apnea (adult) (pediatric): Secondary | ICD-10-CM | POA: Diagnosis not present

## 2023-12-30 DIAGNOSIS — N182 Chronic kidney disease, stage 2 (mild): Secondary | ICD-10-CM | POA: Diagnosis not present

## 2023-12-30 DIAGNOSIS — E1122 Type 2 diabetes mellitus with diabetic chronic kidney disease: Secondary | ICD-10-CM | POA: Diagnosis not present

## 2024-01-03 DIAGNOSIS — E1122 Type 2 diabetes mellitus with diabetic chronic kidney disease: Secondary | ICD-10-CM | POA: Diagnosis not present

## 2024-01-03 DIAGNOSIS — E66813 Obesity, class 3: Secondary | ICD-10-CM | POA: Diagnosis not present

## 2024-01-03 DIAGNOSIS — N182 Chronic kidney disease, stage 2 (mild): Secondary | ICD-10-CM | POA: Diagnosis not present

## 2024-01-03 DIAGNOSIS — E669 Obesity, unspecified: Secondary | ICD-10-CM | POA: Diagnosis not present

## 2024-01-03 DIAGNOSIS — E1169 Type 2 diabetes mellitus with other specified complication: Secondary | ICD-10-CM | POA: Diagnosis not present

## 2024-01-03 DIAGNOSIS — E1165 Type 2 diabetes mellitus with hyperglycemia: Secondary | ICD-10-CM | POA: Diagnosis not present

## 2024-01-03 DIAGNOSIS — Z794 Long term (current) use of insulin: Secondary | ICD-10-CM | POA: Diagnosis not present

## 2024-01-04 DIAGNOSIS — R399 Unspecified symptoms and signs involving the genitourinary system: Secondary | ICD-10-CM | POA: Diagnosis not present

## 2024-01-07 DIAGNOSIS — G4733 Obstructive sleep apnea (adult) (pediatric): Secondary | ICD-10-CM | POA: Diagnosis not present

## 2024-01-10 ENCOUNTER — Ambulatory Visit: Admitting: Urology

## 2024-01-10 VITALS — BP 129/85 | HR 93

## 2024-01-10 DIAGNOSIS — N3941 Urge incontinence: Secondary | ICD-10-CM

## 2024-01-10 DIAGNOSIS — N3000 Acute cystitis without hematuria: Secondary | ICD-10-CM

## 2024-01-10 LAB — URINALYSIS, COMPLETE
Bilirubin, UA: NEGATIVE
Glucose, UA: NEGATIVE
Ketones, UA: NEGATIVE
Nitrite, UA: NEGATIVE
Protein,UA: NEGATIVE
RBC, UA: NEGATIVE
Specific Gravity, UA: 1.015 (ref 1.005–1.030)
Urobilinogen, Ur: 0.2 mg/dL (ref 0.2–1.0)
pH, UA: 6 (ref 5.0–7.5)

## 2024-01-10 LAB — MICROSCOPIC EXAMINATION: Epithelial Cells (non renal): >10 /HPF — AB (ref 0–10)

## 2024-01-10 MED ORDER — CIPROFLOXACIN HCL 250 MG PO TABS: 250.0000 mg | ORAL_TABLET | Freq: Two times a day (BID) | ORAL | 0 refills | Status: DC

## 2024-01-10 NOTE — Progress Notes (Signed)
 01/10/2024 9:56 AM   Judith [name] [birth year] [id number]  Referring provider: Alla Amis, MD [id number] [facility] [address] [facility] [place],  [place] [address]  No chief complaint on file.   HPI: I was consulted to assess the patient's urinary incontinence.  She leaks with coughing sneezing and perhaps bending lifting.  Primary symptom is urge incontinence.  She leaks when she goes from sitting to standing position.  No bedwetting.  She does not wear pads for changes special underwear   She voids every 2 hours gets up 2 or more times at night.  Flow was good   She has had a previous sling.  She is an insulin -dependent diabetic.  She has had a hysterectomy   Currently on nonresponder to oxybutynin   Pelvic examination was a little bit limited due to body habitus and relatively smaller speculum. She appears to have a very well supported bladder neck. I did not see any mesh extrusion. At the right urethrovesical angle I may have felt an abnormality but likely normal. She has a high grade 1 cystocele with no hinging effect. No stress incontinence with modest cough   Patient has mildly mixed incontinence.  She predominately has urge incontinence.  She has frequency and nocturia.  She has had a previous sling.  Call if urine culture positive.  I will have her come back on Gemtesa  samples and prescription for pelvic examination and cystoscopy.  I will reexamine the vaginal vault for any mesh and reassess for stress incontinence.  She understands we may need a test in the future without details today  Today Frequency stable.  Last urine culture negative In the last week patient started having burning and went to [facility] and had a positive culture pansensitive.  She is on antibiotic but still having dysuria.          PMH: Past Medical History:  Diagnosis Date   Anginal pain (HCC)    Arthritis    Asthma    Depression    Diabetes mellitus without complication (HCC)     Dysrhythmia    Fibromyalgia    Gastroparesis    GERD (gastroesophageal reflux disease)    Headache    Sleep apnea     Surgical History: Past Surgical History:  Procedure Laterality Date   ABDOMINAL HYSTERECTOMY     BLADDER REPAIR     CATARACT EXTRACTION Bilateral 02/08/2013   CHOLECYSTECTOMY     COLONOSCOPY WITH PROPOFOL  N/A 02/17/2023   Procedure: COLONOSCOPY WITH PROPOFOL ;  Surgeon: Toledo, Ladell POUR, MD;  Location: [facility] ENDOSCOPY;  Service: Gastroenterology;  Laterality: N/A;   ESOPHAGOGASTRODUODENOSCOPY (EGD) WITH PROPOFOL  N/A 04/01/2016   Procedure: ESOPHAGOGASTRODUODENOSCOPY (EGD) WITH PROPOFOL ;  Surgeon: Lamar ONEIDA Holmes, MD;  Location: [facility] ENDOSCOPY;  Service: Endoscopy;  Laterality: N/A;   rotator cuff surgery Right    TOTAL KNEE ARTHROPLASTY Left 04/24/2021   Procedure: TOTAL KNEE ARTHROPLASTY;  Surgeon: Kathlynn Sharper, MD;  Location: [facility] ORS;  Service: Orthopedics;  Laterality: Left;   TUBAL LIGATION      Home Medications:  Allergies as of 01/10/2024       Reactions   Nortriptyline Nausea And Vomiting   Pravastatin Other (See Comments)   Muscle pain   Topiramate Other (See Comments)   Metformin Nausea Only        Medication List        Accurate as of January 10, 2024  9:56 AM. If you have any questions, ask your nurse or doctor.  albuterol  108 (90 Base) MCG/ACT inhaler Commonly known as: VENTOLIN  HFA Inhale 2 puffs into the lungs every 6 (six) hours as needed for wheezing or shortness of breath.   ARIPiprazole  2 MG tablet Commonly known as: ABILIFY  TAKE 1 TABLET(2 MG) BY MOUTH DAILY   azelastine  0.1 % nasal spray Commonly known as: ASTELIN  Place 1 spray into both nostrils daily as needed for allergies.   cetirizine 10 MG tablet Commonly known as: ZYRTEC Take 10 mg by mouth daily.   Dexcom G7 Receiver Devi Use 1 each once for 1 dose   Dexcom G7 Sensor Misc   Emgality 120 MG/ML Soaj Generic drug: Galcanezumab-gnlm Inject into  the skin.   ezetimibe 10 MG tablet Commonly known as: ZETIA Take 1 tablet by mouth daily.   Gemtesa  75 MG Tabs Generic drug: Vibegron  Take 1 tablet (75 mg total) by mouth daily.   glipiZIDE  10 MG 24 hr tablet Commonly known as: GLUCOTROL  XL Take 20 mg by mouth daily with breakfast.   glucose blood test strip 1 each by Other route. Use as instructed   Lantus  SoloStar 100 UNIT/ML Solostar Pen Generic drug: insulin  glargine Inject 28 Units into the skin at bedtime.   linaclotide 145 MCG Caps capsule Commonly known as: LINZESS Take 145 mcg by mouth every other day.   metoprolol  succinate 25 MG 24 hr tablet Commonly known as: TOPROL -XL 25 mg daily.   Mounjaro 7.5 MG/0.5ML Pen Generic drug: tirzepatide Inject into the skin.   Multi-Vitamin tablet Take 1 tablet by mouth daily.   omeprazole 40 MG capsule Commonly known as: PRILOSEC Take 40 mg by mouth daily.   pioglitazone  15 MG tablet Commonly known as: ACTOS  Take 15 mg by mouth daily.   primidone 50 MG tablet Commonly known as: MYSOLINE Take by mouth 4 (four) times daily.   rizatriptan 10 MG tablet Commonly known as: MAXALT Take by mouth.   venlafaxine  XR 75 MG 24 hr capsule Commonly known as: EFFEXOR -XR TAKE 1 CAPSULE EVERY DAY WITH BREAKFAST. TO BE COMBINED WITH 150MG  DAILY   venlafaxine  XR 150 MG 24 hr capsule Commonly known as: EFFEXOR -XR Take 1 capsule (150 mg total) by mouth daily with breakfast. Take along with 75 mg daily        Allergies:  Allergies  Allergen Reactions   Nortriptyline Nausea And Vomiting   Pravastatin Other (See Comments)    Muscle pain   Topiramate Other (See Comments)   Metformin Nausea Only    Family History: Family History  Problem Relation Age of Onset   Bipolar disorder Sister    Bipolar disorder Brother    Breast cancer Neg Hx     Social History:  reports that she has never smoked. She has never used smokeless tobacco. She reports current alcohol use. She  reports that she does not use drugs.  ROS:                                        Physical Exam: There were no vitals taken for this visit.  Constitutional:  Alert and oriented, No acute distress. HEENT: [place] AT, moist mucus membranes.  Trachea midline, no masses.   Laboratory Data: Lab Results  Component Value Date   WBC 16.0 (H) 04/25/2021   HGB 11.1 (L) 04/25/2021   HCT 32.7 (L) 04/25/2021   MCV 88.6 04/25/2021   PLT 278 04/25/2021    Lab  Results  Component Value Date   CREATININE 0.65 04/25/2021    No results found for: PSA  No results found for: TESTOSTERONE  No results found for: HGBA1C  Urinalysis    Component Value Date/Time   COLORURINE YELLOW (A) 04/14/2021 0945   APPEARANCEUR Hazy (A) 08/09/2023 0827   LABSPEC 1.016 04/14/2021 0945   PHURINE 5.0 04/14/2021 0945   GLUCOSEU Negative 08/09/2023 0827   HGBUR NEGATIVE 04/14/2021 0945   BILIRUBINUR Negative 08/09/2023 0827   KETONESUR NEGATIVE 04/14/2021 0945   PROTEINUR 1+ (A) 08/09/2023 0827   PROTEINUR NEGATIVE 04/14/2021 0945   NITRITE Negative 08/09/2023 0827   NITRITE NEGATIVE 04/14/2021 0945   LEUKOCYTESUR Trace (A) 08/09/2023 0827   LEUKOCYTESUR NEGATIVE 04/14/2021 0945    Pertinent Imaging: Urine reviewed and sent for culture.  Chart reviewed  Assessment & Plan: I thought it was best to postpone the cystoscopy.  She is actually doing very well on the Gemtesa  so I will do the cystoscopy in 6 to 8 weeks.  Call if culture differs.  I called in ciprofloxacin  250 mg twice a day for 7 days.  The bacteria was sensitive to Bactrim but she only had it for 3 days.  1. Urge incontinence of urine (Primary)    No follow-ups on file.  Glendia DELENA Elizabeth, MD  [facility] [address], [address] [place], [place] [address] [phone]

## 2024-01-12 DIAGNOSIS — M1711 Unilateral primary osteoarthritis, right knee: Secondary | ICD-10-CM | POA: Diagnosis not present

## 2024-01-13 LAB — CULTURE, URINE COMPREHENSIVE

## 2024-03-08 DIAGNOSIS — G4733 Obstructive sleep apnea (adult) (pediatric): Secondary | ICD-10-CM | POA: Diagnosis not present

## 2024-03-13 DIAGNOSIS — R251 Tremor, unspecified: Secondary | ICD-10-CM | POA: Diagnosis not present

## 2024-03-13 DIAGNOSIS — G3184 Mild cognitive impairment, so stated: Secondary | ICD-10-CM | POA: Diagnosis not present

## 2024-03-13 DIAGNOSIS — R2681 Unsteadiness on feet: Secondary | ICD-10-CM | POA: Diagnosis not present

## 2024-03-13 DIAGNOSIS — R42 Dizziness and giddiness: Secondary | ICD-10-CM | POA: Diagnosis not present

## 2024-03-13 DIAGNOSIS — R519 Headache, unspecified: Secondary | ICD-10-CM | POA: Diagnosis not present

## 2024-03-16 ENCOUNTER — Other Ambulatory Visit: Payer: Self-pay | Admitting: Psychiatry

## 2024-03-16 DIAGNOSIS — F251 Schizoaffective disorder, depressive type: Secondary | ICD-10-CM

## 2024-03-27 ENCOUNTER — Ambulatory Visit: Admitting: Urology

## 2024-03-27 VITALS — BP 132/77 | HR 92 | Ht 62.0 in | Wt 221.8 lb

## 2024-03-27 DIAGNOSIS — N3946 Mixed incontinence: Secondary | ICD-10-CM | POA: Diagnosis not present

## 2024-03-27 NOTE — Progress Notes (Signed)
 03/27/2024 2:40 PM   Judith [name] 01-Jun-1950 [id number]  Referring provider: Alla Amis, MD [phone] [facility] [place] [address] [facility] [place],  [place] [address]  No chief complaint on file.   HPI: I was consulted to assess the patient's urinary incontinence.  She leaks with coughing sneezing and perhaps bending lifting.  Primary symptom is urge incontinence.  She leaks when she goes from sitting to standing position.  No bedwetting.  She does not wear pads for changes special underwear   She voids every 2 hours gets up 2 or more times at night.  Flow was good   She has had a previous sling.  She is an insulin -dependent diabetic.  She has had a hysterectomy   Currently on nonresponder to oxybutynin    Pelvic examination was a little bit limited due to body habitus and relatively smaller speculum. She appears to have a very well supported bladder neck. I did not see any mesh extrusion. At the right urethrovesical angle I may have felt an abnormality but likely normal. She has a high grade 1 cystocele with no hinging effect. No stress incontinence with modest cough    Patient has mildly mixed incontinence.  She predominately has urge incontinence.  She has frequency and nocturia.  She has had a previous sling.  Call if urine culture positive.  I will have her come back on Gemtesa  samples and prescription for pelvic examination and cystoscopy.  I will reexamine the vaginal vault for any mesh and reassess for stress incontinence.  She understands we may need a test in the future without details today   In the last week patient started having burning and went to [facility] and had a positive culture pansensitive.  She is on antibiotic but still having dysuria.    I thought it was best to postpone the cystoscopy. She is actually doing very well on the Gemtesa  so I will do the cystoscopy in 6 to 8 weeks. Call if culture differs. I called in ciprofloxacin  250 mg twice a day for 7 days. The  bacteria was sensitive to Bactrim but she only had it for 3 days.   Today Frequency stable.  Last culture negative On repeat pelvic examination she did have a small area of exposure approximately 6 x 4 mm underneath the mid urethra.  No vaginitis or inflammation or tenderness.  No history of vaginitis or vaginal bleeding Patient underwent flexible cystoscopy.  Bladder mucosa and trigone were normal.  Urethra normal.  No foreign body in bladder or urethra.  No cystitis.  No carcinoma   PMH: Past Medical History:  Diagnosis Date   Anginal pain    Arthritis    Asthma    Depression    Diabetes mellitus without complication (HCC)    Dysrhythmia    Fibromyalgia    Gastroparesis    GERD (gastroesophageal reflux disease)    Headache    Sleep apnea     Surgical History: Past Surgical History:  Procedure Laterality Date   ABDOMINAL HYSTERECTOMY     BLADDER REPAIR     CATARACT EXTRACTION Bilateral 02/08/2013   CHOLECYSTECTOMY     COLONOSCOPY WITH PROPOFOL  N/A 02/17/2023   Procedure: COLONOSCOPY WITH PROPOFOL ;  Surgeon: Toledo, Ladell POUR, MD;  Location: [facility] ENDOSCOPY;  Service: Gastroenterology;  Laterality: N/A;   ESOPHAGOGASTRODUODENOSCOPY (EGD) WITH PROPOFOL  N/A 04/01/2016   Procedure: ESOPHAGOGASTRODUODENOSCOPY (EGD) WITH PROPOFOL ;  Surgeon: Lamar ONEIDA Holmes, MD;  Location: [facility] ENDOSCOPY;  Service: Endoscopy;  Laterality: N/A;   rotator  cuff surgery Right    TOTAL KNEE ARTHROPLASTY Left 04/24/2021   Procedure: TOTAL KNEE ARTHROPLASTY;  Surgeon: Kathlynn Sharper, MD;  Location: [facility] ORS;  Service: Orthopedics;  Laterality: Left;   TUBAL LIGATION      Home Medications:  Allergies as of 03/27/2024       Reactions   Nortriptyline Nausea And Vomiting   Pravastatin Other (See Comments)   Muscle pain   Topiramate Other (See Comments)   Metformin Nausea Only        Medication List        Accurate as of March 27, 2024  2:40 PM. If you have any questions, ask your nurse or  doctor.          albuterol  108 (90 Base) MCG/ACT inhaler Commonly known as: VENTOLIN  HFA Inhale 2 puffs into the lungs every 6 (six) hours as needed for wheezing or shortness of breath.   ARIPiprazole  2 MG tablet Commonly known as: ABILIFY  TAKE 1 TABLET(2 MG) BY MOUTH DAILY   azelastine  0.1 % nasal spray Commonly known as: ASTELIN  Place 1 spray into both nostrils daily as needed for allergies.   cetirizine 10 MG tablet Commonly known as: ZYRTEC Take 10 mg by mouth daily.   ciprofloxacin  250 MG tablet Commonly known as: Cipro  Take 1 tablet (250 mg total) by mouth 2 (two) times daily.   Dexcom G7 Receiver Devi Use 1 each once for 1 dose   Dexcom G7 Sensor Misc   Emgality 120 MG/ML Soaj Generic drug: Galcanezumab-gnlm Inject into the skin.   ezetimibe 10 MG tablet Commonly known as: ZETIA Take 1 tablet by mouth daily.   Gemtesa  75 MG Tabs Generic drug: Vibegron  Take 1 tablet (75 mg total) by mouth daily.   glipiZIDE  10 MG 24 hr tablet Commonly known as: GLUCOTROL  XL Take 20 mg by mouth daily with breakfast.   glucose blood test strip 1 each by Other route. Use as instructed   Lantus  SoloStar 100 UNIT/ML Solostar Pen Generic drug: insulin  glargine Inject 28 Units into the skin at bedtime.   linaclotide 145 MCG Caps capsule Commonly known as: LINZESS Take 145 mcg by mouth every other day.   metoprolol  succinate 25 MG 24 hr tablet Commonly known as: TOPROL -XL 25 mg daily.   Mounjaro 7.5 MG/0.5ML Pen Generic drug: tirzepatide Inject into the skin.   Multi-Vitamin tablet Take 1 tablet by mouth daily.   omeprazole 40 MG capsule Commonly known as: PRILOSEC Take 40 mg by mouth daily.   primidone 50 MG tablet Commonly known as: MYSOLINE Take by mouth 4 (four) times daily.   rizatriptan 10 MG tablet Commonly known as: MAXALT Take by mouth.   venlafaxine  XR 75 MG 24 hr capsule Commonly known as: EFFEXOR -XR TAKE 1 CAPSULE EVERY DAY WITH BREAKFAST.  TO BE COMBINED WITH 150MG  DAILY   venlafaxine  XR 150 MG 24 hr capsule Commonly known as: EFFEXOR -XR Take 1 capsule (150 mg total) by mouth daily with breakfast. Take along with 75 mg daily        Allergies:  Allergies  Allergen Reactions   Nortriptyline Nausea And Vomiting   Pravastatin Other (See Comments)    Muscle pain   Topiramate Other (See Comments)   Metformin Nausea Only    Family History: Family History  Problem Relation Age of Onset   Bipolar disorder Sister    Bipolar disorder Brother    Breast cancer Neg Hx     Social History:  reports that she has never smoked.  She has never used smokeless tobacco. She reports current alcohol use. She reports that she does not use drugs.  ROS:                                        Physical Exam: There were no vitals taken for this visit.  Constitutional:  Alert and oriented, No acute distress. HEENT: [place] AT, moist mucus membranes.  Trachea midline, no masses.   Laboratory Data: Lab Results  Component Value Date   WBC 16.0 (H) 04/25/2021   HGB 11.1 (L) 04/25/2021   HCT 32.7 (L) 04/25/2021   MCV 88.6 04/25/2021   PLT 278 04/25/2021    Lab Results  Component Value Date   CREATININE 0.65 04/25/2021    No results found for: PSA  No results found for: TESTOSTERONE  No results found for: HGBA1C  Urinalysis    Component Value Date/Time   COLORURINE YELLOW (A) 04/14/2021 0945   APPEARANCEUR Slightly cloudy 01/10/2024 1024   LABSPEC 1.016 04/14/2021 0945   PHURINE 5.0 04/14/2021 0945   GLUCOSEU Negative 01/10/2024 1024   HGBUR NEGATIVE 04/14/2021 0945   BILIRUBINUR Negative 01/10/2024 1024   KETONESUR NEGATIVE 04/14/2021 0945   PROTEINUR Negative 01/10/2024 1024   PROTEINUR NEGATIVE 04/14/2021 0945   NITRITE Negative 01/10/2024 1024   NITRITE NEGATIVE 04/14/2021 0945   LEUKOCYTESUR 1+ (A) 01/10/2024 1024   LEUKOCYTESUR NEGATIVE 04/14/2021 0945    Pertinent Imaging: No  blood in urine   Assessment & Plan: Picture drawn.  Watchful waiting recommended for mesh exposure.  Asymptomatic.  Stay on Gemtesa  for greatly improved urge incontinence.  Reassess in 6 months  1. Mixed incontinence (Primary)  - Urinalysis, Complete   No follow-ups on file.  Glendia DELENA Elizabeth, MD  [facility] [address] [place], [place] [address] [phone]

## 2024-03-28 LAB — URINALYSIS, COMPLETE
Bilirubin, UA: NEGATIVE
Glucose, UA: NEGATIVE
Ketones, UA: NEGATIVE
Leukocytes,UA: NEGATIVE
Nitrite, UA: NEGATIVE
Protein,UA: NEGATIVE
RBC, UA: NEGATIVE
Specific Gravity, UA: <1.005 — ABNORMAL LOW (ref 1.005–1.030)
Urobilinogen, Ur: 0.2 mg/dL (ref 0.2–1.0)
pH, UA: 6 (ref 5.0–7.5)

## 2024-03-28 LAB — MICROSCOPIC EXAMINATION
Bacteria, UA: NONE SEEN
RBC, Urine: NONE SEEN /HPF (ref 0–2)

## 2024-03-29 DIAGNOSIS — Z794 Long term (current) use of insulin: Secondary | ICD-10-CM | POA: Diagnosis not present

## 2024-03-29 DIAGNOSIS — E1165 Type 2 diabetes mellitus with hyperglycemia: Secondary | ICD-10-CM | POA: Diagnosis not present

## 2024-04-07 DIAGNOSIS — G4733 Obstructive sleep apnea (adult) (pediatric): Secondary | ICD-10-CM | POA: Diagnosis not present

## 2024-05-13 ENCOUNTER — Other Ambulatory Visit: Payer: Self-pay

## 2024-05-13 ENCOUNTER — Emergency Department

## 2024-05-13 ENCOUNTER — Emergency Department
Admission: EM | Admit: 2024-05-13 | Discharge: 2024-05-13 | Disposition: A | Discharge status: Home / Self Care | Attending: Emergency Medicine | Admitting: Emergency Medicine

## 2024-05-13 DIAGNOSIS — N183 Chronic kidney disease, stage 3 unspecified: Secondary | ICD-10-CM | POA: Diagnosis not present

## 2024-05-13 DIAGNOSIS — J45909 Unspecified asthma, uncomplicated: Secondary | ICD-10-CM | POA: Diagnosis not present

## 2024-05-13 DIAGNOSIS — S6992XA Unspecified injury of left wrist, hand and finger(s), initial encounter: Secondary | ICD-10-CM | POA: Diagnosis present

## 2024-05-13 DIAGNOSIS — S62515A Nondisplaced fracture of proximal phalanx of left thumb, initial encounter for closed fracture: Secondary | ICD-10-CM | POA: Diagnosis not present

## 2024-05-13 DIAGNOSIS — W19XXXA Unspecified fall, initial encounter: Secondary | ICD-10-CM

## 2024-05-13 DIAGNOSIS — E1122 Type 2 diabetes mellitus with diabetic chronic kidney disease: Secondary | ICD-10-CM | POA: Insufficient documentation

## 2024-05-13 DIAGNOSIS — W1839XA Other fall on same level, initial encounter: Secondary | ICD-10-CM | POA: Diagnosis not present

## 2024-05-13 LAB — CBC WITH DIFFERENTIAL/PLATELET
Abs Immature Granulocytes: 0.04 K/uL (ref 0.00–0.07)
Basophils Absolute: 0 K/uL (ref 0.0–0.1)
Basophils Relative: 0 %
Eosinophils Absolute: 0.1 K/uL (ref 0.0–0.5)
Eosinophils Relative: 1 %
HCT: 36.6 % (ref 36.0–46.0)
Hemoglobin: 12.2 g/dL (ref 12.0–15.0)
Immature Granulocytes: 0 %
Lymphocytes Relative: 24 %
Lymphs Abs: 2.4 K/uL (ref 0.7–4.0)
MCH: 30.6 pg (ref 26.0–34.0)
MCHC: 33.3 g/dL (ref 30.0–36.0)
MCV: 91.7 fL (ref 80.0–100.0)
Monocytes Absolute: 0.6 K/uL (ref 0.1–1.0)
Monocytes Relative: 6 %
Neutro Abs: 6.8 K/uL (ref 1.7–7.7)
Neutrophils Relative %: 69 %
Platelets: 239 K/uL (ref 150–400)
RBC: 3.99 MIL/uL (ref 3.87–5.11)
RDW: 13.4 % (ref 11.5–15.5)
WBC: 10 K/uL (ref 4.0–10.5)
nRBC: 0 % (ref 0.0–0.2)

## 2024-05-13 LAB — BASIC METABOLIC PANEL WITH GFR
Anion gap: 12 (ref 5–15)
BUN: 18 mg/dL (ref 8–23)
CO2: 22 mmol/L (ref 22–32)
Calcium: 9.6 mg/dL (ref 8.9–10.3)
Chloride: 100 mmol/L (ref 98–111)
Creatinine, Ser: 0.92 mg/dL (ref 0.44–1.00)
GFR, Estimated: >60 mL/min
Glucose, Bld: 170 mg/dL — ABNORMAL HIGH (ref 70–99)
Potassium: 4 mmol/L (ref 3.5–5.1)
Sodium: 134 mmol/L — ABNORMAL LOW (ref 135–145)

## 2024-05-13 MED ORDER — ACETAMINOPHEN 325 MG PO TABS
650.0000 mg | ORAL_TABLET | Freq: Once | ORAL | Status: AC
  Administered 2024-05-13: 650 mg via ORAL
  Filled 2024-05-13: qty 2

## 2024-05-13 NOTE — ED Provider Notes (Signed)
 "  [facility] Provider Note    Event Date/Time   First MD Initiated Contact with Patient 05/13/24 1049     (approximate)   History   No chief complaint on file.   HPI  Judith [name] is a 74 y.o. female with a past medical history of schizoaffective disorder, type 2 diabetes, fibromyalgia who presents today for evaluation of left wrist pain.  Patient reports that she leaned over to pick something up under a bookshelf and lost her balance and fell onto her left outstretched hand.  She denies head strike or LOC.  She reports that she got herself up, and then noticed that her Dexcom alerted her that her blood sugar was in the 60s.  She reports that she does not feel dizzy or lightheaded.  She reports that her Dexcom now says that her blood sugar is in the 140s.  She is not had any nausea or vomiting.  No numbness or tingling or weakness.  No other injury sustained.  She is able to walk since this happened.  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   Insomnia 02/15/2023   Osteopenia of lumbar spine 07/15/2021   S/P TKR (total knee replacement) using cement, left 04/24/2021   Aortic atherosclerosis 04/22/2021   High risk medication use 03/04/2021   Urge incontinence of urine 10/25/2020   Unstable gait 09/04/2020   Numbness of left foot 04/16/2020   Statin intolerance 02/13/2020   CKD (chronic kidney disease) stage 3, GFR 30-59 ml/min (HCC) 02/13/2020   Diabetes (HCC) 01/29/2020   Sleep apnea 01/29/2020   Asthma 01/29/2020   Hyperlipidemia 01/29/2020   GERD (gastroesophageal reflux disease) 01/29/2020   Schizoaffective disorder, depressive type (HCC) 01/29/2020   Cognitive disorder 01/29/2020   Bereavement 01/29/2020   History of palpitations 08/14/2019   Hyperlipidemia associated with type 2 diabetes mellitus (HCC) 06/14/2019   Encounter for general  adult medical examination without abnormal findings 01/12/2019   Class 2 severe obesity due to excess calories with serious comorbidity and body mass index (BMI) of 36.0 to 36.9 in adult 06/15/2018   Morbid obesity with BMI of 40.0-44.9, adult (HCC) 06/15/2018   Bilateral occipital neuralgia 03/09/2018   Dizziness 12/07/2017   Controlled type 2 diabetes mellitus with stage 3 chronic kidney disease, without long-term current use of insulin  (HCC) 09/01/2017   Diabetic polyneuropathy associated with type 2 diabetes mellitus (HCC) 09/01/2017   Bilateral carpal tunnel syndrome 03/23/2017   Tremor 10/13/2016   Memory loss due to medical condition 10/13/2016   Headache disorder 10/13/2016   Imbalance 08/23/2016   H/O vitamin D deficiency 10/02/2015   DDD (degenerative disc disease), cervical 05/22/2011   Obstructive sleep apnea on CPAP 01/08/2011   Rosacea 01/08/2011   Migraine headache 01/08/2011   Fibromyalgia 01/08/2011          Physical Exam   Triage Vital Signs: ED Triage Vitals  Encounter Vitals Group     BP 05/13/24 1037 126/84     Girls Systolic BP Percentile --      Girls Diastolic BP Percentile --      Boys Systolic BP Percentile --      Boys Diastolic BP Percentile --      Pulse Rate 05/13/24 1037 (!) 105     Resp 05/13/24 1037 16     Temp 05/13/24 1037 97.9 F (36.6 C)  Temp Source 05/13/24 1037 Oral     SpO2 05/13/24 1037 96 %     Weight 05/13/24 1038 221 lb 12.5 oz (100.6 kg)     Height --      Head Circumference --      Peak Flow --      Pain Score 05/13/24 1037 9     Pain Loc --      Pain Education --      Exclude from Growth Chart --     Most recent vital signs: Vitals:   05/13/24 1037 05/13/24 1109  BP: 126/84   Pulse: (!) 105   Resp: 16   Temp: 97.9 F (36.6 C)   SpO2: 96% 96%    Physical Exam Vitals and nursing note reviewed.  Constitutional:      General: Awake and alert. No acute distress.    Appearance: Normal appearance. The patient  is normal weight.  HENT:     Head: Normocephalic and atraumatic.     Mouth: Mucous membranes are moist.  Eyes:     General: PERRL. Normal EOMs        Right eye: No discharge.        Left eye: No discharge.     Conjunctiva/sclera: Conjunctivae normal.  Cardiovascular:     Rate and Rhythm: Normal rate and regular rhythm.     Pulses: Normal pulses.  Pulmonary:     Effort: Pulmonary effort is normal. No respiratory distress.     Breath sounds: Normal breath sounds.  Abdominal:     Abdomen is soft. There is no abdominal tenderness. No rebound or guarding. No distention. Musculoskeletal:        General: No swelling. Normal range of motion.     Cervical back: Normal range of motion and neck supple.  No cervical spine tenderness Left hand: Ecchymosis noted to palmar aspect of the hand, as well as to the base of the thumb.  Patient has pain with attempted abduction and adduction of thumb.  No open wounds.  No wrist swelling or hand swelling.  No tenderness to forearm, elbow, humerus, or shoulder.  Normal radial pulse.  Sensation intact light touch throughout. Skin:    General: Skin is warm and dry.     Capillary Refill: Capillary refill takes less than 2 seconds.     Findings: No rash.  Neurological:     Mental Status: The patient is awake and alert.  No focal neurological deficits     ED Results / Procedures / Treatments   Labs (all labs ordered are listed, but only abnormal results are displayed) Labs Reviewed  BASIC METABOLIC PANEL WITH GFR - Abnormal; Notable for the following components:      Result Value   Sodium 134 (*)    Glucose, Bld 170 (*)    All other components within normal limits  CBC WITH DIFFERENTIAL/PLATELET     EKG     RADIOLOGY I independently reviewed and interpreted imaging and agree with radiologists findings.     PROCEDURES:  Critical Care performed:   Procedures   MEDICATIONS ORDERED IN ED: Medications  acetaminophen  (TYLENOL ) tablet 650  mg (650 mg Oral Given 05/13/24 1109)     IMPRESSION / MDM / ASSESSMENT AND PLAN / ED COURSE  I reviewed the triage vital signs and the nursing notes.   Differential diagnosis includes, but is not limited to, fracture, contusion, sprain, hypoglycemia.  Patient is awake and alert, hemodynamically stable and afebrile.  There are no  deformities noted.  She has ecchymosis noted along the thumb and wrist, though she is neurovascularly intact.  Normal radial pulse.  EKG reveals normal sinus rhythm.  Labs obtained are overall reassuring, no hypoglycemia, normal electrolytes, no significant anemia.  She does not appear to be clinically dehydrated.  X-ray of her wrist reveals a possible intra-articular fracture involving the radial and volar base of the proximal phalanx of the thumb.  This is the area in which she has ecchymosis and tenderness.  Splint applied, thumb spica splint with sugar-tong.  She was instructed to follow-up with hand surgery and the appropriate follow-up information was provided.  We discussed return precautions in the meantime.  Splint care discussed.  Patient understands and agrees with plan.  Discharged in stable condition   Patient's presentation is most consistent with acute complicated illness / injury requiring diagnostic workup.   FINAL CLINICAL IMPRESSION(S) / ED DIAGNOSES   Final diagnoses:  Closed nondisplaced fracture of proximal phalanx of left thumb, initial encounter  Fall, initial encounter     Rx / DC Orders   ED Discharge Orders     None        Note:  This document was prepared using Dragon voice recognition software and may include unintentional dictation errors.   Tzipora Mcinroy E, PA-C 05/13/24 1437    Arlander Charleston, MD 05/13/24 1445  "

## 2024-05-13 NOTE — ED Triage Notes (Signed)
 Fall yesterday, bending over reaching into a cabinet and fell.  Unsure if LOC or not, but dexcom read Blood Sugar as 68 after fall. C/O left hand and wrist pain.  Bruising to left anterior hand.  + CMS. + radial pulse. Brisk cap refill to extremity.

## 2024-05-13 NOTE — Discharge Instructions (Signed)
 Please keep your splint clean and dry.  Please follow-up with orthopedics.  Please return for any new, worsening, or changing symptoms or other concerns.  It was a pleasure caring for you today.

## 2024-05-30 ENCOUNTER — Ambulatory Visit: Admitting: Psychiatry

## 2024-07-25 ENCOUNTER — Ambulatory Visit: Admitting: Psychiatry

## 2024-10-02 ENCOUNTER — Ambulatory Visit: Admitting: Urology
